# Patient Record
Sex: Female | Born: 1950
Health system: Southern US, Community
[De-identification: ages and names within clinical notes are randomized; demographics above are authoritative.]

## PROBLEM LIST (undated history)

## (undated) DIAGNOSIS — N393 Stress incontinence (female) (male): Secondary | ICD-10-CM

## (undated) DIAGNOSIS — G9050 Complex regional pain syndrome I, unspecified: Secondary | ICD-10-CM

## (undated) DIAGNOSIS — R002 Palpitations: Secondary | ICD-10-CM

## (undated) DIAGNOSIS — T7840XA Allergy, unspecified, initial encounter: Secondary | ICD-10-CM

## (undated) DIAGNOSIS — E039 Hypothyroidism, unspecified: Secondary | ICD-10-CM

## (undated) DIAGNOSIS — I10 Essential (primary) hypertension: Secondary | ICD-10-CM

## (undated) DIAGNOSIS — I255 Ischemic cardiomyopathy: Secondary | ICD-10-CM

## (undated) DIAGNOSIS — E6609 Other obesity due to excess calories: Secondary | ICD-10-CM

## (undated) DIAGNOSIS — M1990 Unspecified osteoarthritis, unspecified site: Secondary | ICD-10-CM

## (undated) DIAGNOSIS — I213 ST elevation (STEMI) myocardial infarction of unspecified site: Secondary | ICD-10-CM

## (undated) DIAGNOSIS — I2510 Atherosclerotic heart disease of native coronary artery without angina pectoris: Secondary | ICD-10-CM

## (undated) DIAGNOSIS — M7730 Calcaneal spur, unspecified foot: Secondary | ICD-10-CM

## (undated) DIAGNOSIS — I499 Cardiac arrhythmia, unspecified: Secondary | ICD-10-CM

## (undated) DIAGNOSIS — D649 Anemia, unspecified: Secondary | ICD-10-CM

## (undated) DIAGNOSIS — K622 Anal prolapse: Secondary | ICD-10-CM

## (undated) DIAGNOSIS — E785 Hyperlipidemia, unspecified: Secondary | ICD-10-CM

## (undated) HISTORY — DX: Unspecified osteoarthritis, unspecified site: M19.90

## (undated) HISTORY — DX: Other obesity due to excess calories: E66.09

## (undated) HISTORY — PX: BUNIONECTOMY: SHX129

## (undated) HISTORY — DX: Calcaneal spur, unspecified foot: M77.30

## (undated) HISTORY — DX: Allergy, unspecified, initial encounter: T78.40XA

## (undated) HISTORY — DX: Anemia, unspecified: D64.9

## (undated) HISTORY — PX: CORONARY ANGIOPLASTY: SHX604

## (undated) HISTORY — PX: DILATION AND CURETTAGE OF UTERUS: SHX78

## (undated) HISTORY — PX: COLONOSCOPY: SHX174

## (undated) HISTORY — DX: Essential (primary) hypertension: I10

## (undated) HISTORY — DX: Anal prolapse: K62.2

## (undated) HISTORY — DX: Complex regional pain syndrome I, unspecified: G90.50

## (undated) HISTORY — DX: Palpitations: R00.2

## (undated) HISTORY — DX: Hypothyroidism, unspecified: E03.9

## (undated) HISTORY — DX: Hyperlipidemia, unspecified: E78.5

## (undated) HISTORY — DX: Stress incontinence (female) (male): N39.3

---

## 1999-02-24 ENCOUNTER — Other Ambulatory Visit: Admission: RE | Admit: 1999-02-24 | Discharge: 1999-02-24 | Payer: Self-pay | Admitting: *Deleted

## 2000-02-25 ENCOUNTER — Other Ambulatory Visit: Admission: RE | Admit: 2000-02-25 | Discharge: 2000-02-25 | Payer: Self-pay | Admitting: *Deleted

## 2001-02-27 ENCOUNTER — Other Ambulatory Visit: Admission: RE | Admit: 2001-02-27 | Discharge: 2001-02-27 | Payer: Self-pay | Admitting: *Deleted

## 2002-04-30 ENCOUNTER — Other Ambulatory Visit: Admission: RE | Admit: 2002-04-30 | Discharge: 2002-04-30 | Payer: Self-pay | Admitting: *Deleted

## 2003-01-21 ENCOUNTER — Encounter: Payer: Self-pay | Admitting: *Deleted

## 2003-01-28 ENCOUNTER — Observation Stay (HOSPITAL_COMMUNITY): Admission: RE | Admit: 2003-01-28 | Discharge: 2003-01-29 | Payer: Self-pay | Admitting: *Deleted

## 2003-01-28 ENCOUNTER — Encounter (INDEPENDENT_AMBULATORY_CARE_PROVIDER_SITE_OTHER): Payer: Self-pay | Admitting: *Deleted

## 2003-07-20 ENCOUNTER — Other Ambulatory Visit: Admission: RE | Admit: 2003-07-20 | Discharge: 2003-07-20 | Payer: Self-pay | Admitting: *Deleted

## 2003-12-19 HISTORY — PX: ABDOMINAL HYSTERECTOMY: SHX81

## 2004-07-20 ENCOUNTER — Other Ambulatory Visit: Admission: RE | Admit: 2004-07-20 | Discharge: 2004-07-20 | Payer: Self-pay | Admitting: *Deleted

## 2004-12-18 DIAGNOSIS — I499 Cardiac arrhythmia, unspecified: Secondary | ICD-10-CM

## 2004-12-18 HISTORY — DX: Cardiac arrhythmia, unspecified: I49.9

## 2005-08-10 ENCOUNTER — Other Ambulatory Visit: Admission: RE | Admit: 2005-08-10 | Discharge: 2005-08-10 | Payer: Self-pay | Admitting: *Deleted

## 2005-10-31 ENCOUNTER — Ambulatory Visit (HOSPITAL_BASED_OUTPATIENT_CLINIC_OR_DEPARTMENT_OTHER): Admission: RE | Admit: 2005-10-31 | Discharge: 2005-10-31 | Payer: Self-pay | Admitting: Orthopedic Surgery

## 2005-10-31 ENCOUNTER — Ambulatory Visit (HOSPITAL_COMMUNITY): Admission: RE | Admit: 2005-10-31 | Discharge: 2005-10-31 | Payer: Self-pay | Admitting: Orthopedic Surgery

## 2006-04-09 ENCOUNTER — Ambulatory Visit: Payer: Self-pay | Admitting: Physical Medicine & Rehabilitation

## 2006-04-09 ENCOUNTER — Encounter
Admission: RE | Admit: 2006-04-09 | Discharge: 2006-07-08 | Payer: Self-pay | Admitting: Physical Medicine & Rehabilitation

## 2006-04-12 ENCOUNTER — Ambulatory Visit: Payer: Self-pay | Admitting: Physical Medicine & Rehabilitation

## 2006-04-23 ENCOUNTER — Encounter
Admission: RE | Admit: 2006-04-23 | Discharge: 2006-07-22 | Payer: Self-pay | Admitting: Physical Medicine & Rehabilitation

## 2006-05-16 ENCOUNTER — Ambulatory Visit: Payer: Self-pay | Admitting: Physical Medicine & Rehabilitation

## 2006-06-18 ENCOUNTER — Ambulatory Visit: Payer: Self-pay | Admitting: Physical Medicine & Rehabilitation

## 2006-07-18 ENCOUNTER — Ambulatory Visit: Payer: Self-pay | Admitting: Physical Medicine & Rehabilitation

## 2006-07-26 ENCOUNTER — Ambulatory Visit: Payer: Self-pay | Admitting: Physical Medicine & Rehabilitation

## 2006-10-15 ENCOUNTER — Other Ambulatory Visit: Admission: RE | Admit: 2006-10-15 | Discharge: 2006-10-15 | Payer: Self-pay | Admitting: *Deleted

## 2007-10-29 ENCOUNTER — Other Ambulatory Visit: Admission: RE | Admit: 2007-10-29 | Discharge: 2007-10-29 | Payer: Self-pay | Admitting: *Deleted

## 2008-03-31 LAB — HM DEXA SCAN: HM Dexa Scan: NORMAL

## 2008-10-29 ENCOUNTER — Other Ambulatory Visit: Admission: RE | Admit: 2008-10-29 | Discharge: 2008-10-29 | Payer: Self-pay | Admitting: Gynecology

## 2010-10-21 ENCOUNTER — Ambulatory Visit: Payer: Self-pay | Admitting: Cardiology

## 2011-05-05 NOTE — Procedures (Signed)
Kim [name], Kim [name]               ACCOUNT NO.:  [id number]   MEDICAL RECORD NO.:  [id number]          PATIENT TYPE:  REC   LOCATION:  TPC                          FACILITY:  [facility]   PHYSICIAN:  Erick Colace, M.D.DATE OF BIRTH:  [birth year]   DATE OF PROCEDURE:  06/04/2006  DATE OF DISCHARGE:                                 OPERATIVE REPORT   PROCEDURE:  Acupuncture treatment #7.   INDICATION:  Reflex sympathetic dystrophy, left lower extremity.   PAIN LEVEL:  3 to 4/10.   TREATMENT TODAY:  Consisted of bilateral BL 21 and BL 27, as well as right  BL 23, BL 25, right QB 30, right QB 31, right QB 34, right BL 60, electrical  stim 4 hertz 30 minutes.  The patient tolerated the procedure well.   Will see her back in about two weeks.      Erick Colace, M.D.  Electronically Signed     AEK/MEDQ  D:  06/04/2006 09:55:39  T:  06/05/2006 06:56:40  Job:  [id number]

## 2011-05-05 NOTE — Discharge Summary (Signed)
   Kim [name], Kim [name]                           ACCOUNT NO.:  [id number]   MEDICAL RECORD NO.:  [id number]                   PATIENT TYPE:  OBV   LOCATION:  0442                                 FACILITY:  [facility]   PHYSICIAN:  Almedia Balls. Fore, M.D.                DATE OF BIRTH:  January 15, 1951   DATE OF ADMISSION:  01/28/2003  DATE OF DISCHARGE:  01/29/2003                                 DISCHARGE SUMMARY   HISTORY:  The patient is a 60 year old with abnormal uterine bleeding,  uterine enlargement for hysterectomy, bilateral salpingo-oophorectomy, on 28 January 2003.  The remainder of her history and physical are as previously  dictated.   LABORATORY DATA:  Preoperative hemoglobin 11.8.  Chest x-ray and  electrocardiogram were both within normal limits.   HOSPITAL COURSE:  The patient was taken to the operating room where  abdominal supracervical hysterectomy, bilateral salpingo-oophorectomy were  performed.  The patient did well postoperatively.  Diet and ambulation were  progressed over the evening of 28 January 2003 and early morning of 29 January 2003.  On the morning of 12 February, she was afebrile, on a  regular diet, and experiencing no problems.  It was felt that she could be  discharged at this time.   FINAL DIAGNOSES:  1. Uterine enlargement.  2. Abnormal uterine bleeding.  3. Pelvic pain.  4. History of endometriosis.   OPERATION:  1. Abdominal supracervical hysterectomy.  2. Bilateral salpingo-oophorectomy.  Pathology report unavailable at the     time of dictation.   DISPOSITION:  Discharged home to return to the office in two weeks for  follow-up.  She was instructed to gradually progress her activities over  several weeks at home and to limit lifting and driving for two weeks.  She  was fully ambulatory, on a regular diet, and in good condition at the time  of discharge.   DISCHARGE MEDICATIONS:  1. Dilaudid 2 mg #30 to be used 1-2 q.4-6h. p.r.n. pain.  2. Doxycycline 100 mg #12 to be taken 1 b.i.d.  3. Reglan generic 10 mg #15 to be used 1 t.i.d.  4. Meclizine 25 mg #30 to be taken 1 t.i.d.   DISCHARGE INSTRUCTIONS:  She will call for any problems.                                               Almedia Balls. [name], M.D.    SRF/MEDQ  D:  01/29/2003  T:  01/29/2003  Job:  [id number]

## 2011-05-05 NOTE — Procedures (Signed)
NAMEGLORIAJEAN, OKUN               ACCOUNT NO.:  [id number]   MEDICAL RECORD NO.:  [id number]          PATIENT TYPE:  REC   LOCATION:                               FACILITY:  [facility]   PHYSICIAN:  Erick Colace, M.D.DATE OF BIRTH:  07/24/1951   DATE OF PROCEDURE:  DATE OF DISCHARGE:                                 OPERATIVE REPORT   Acupuncture treatment for reflex sympathetic dystrophy.  Pain level is down  to 2-3/10, was at a 4 last time.  Treatment today consists of right BL21,  BL23, BL25, BL27, right GB30, right GB31, right GB34, right BL60, 4 Hz stim  30 minutes.  The patient tolerated the procedure well.  We will see her back  in two weeks.      Erick Colace, M.D.  Electronically Signed     AEK/MEDQ  D:  06/21/2006 16:47:36  T:  06/21/2006 19:32:03  Job:  [id number]   cc:   Caralyn Guile. Ethelene Hal, M.D.  Fax: [phone]

## 2011-05-05 NOTE — Procedures (Signed)
Kim [name], Kim [name]               ACCOUNT NO.:  [id number]   MEDICAL RECORD NO.:  [id number]          PATIENT TYPE:  REC   LOCATION:  TPC                          FACILITY:  [facility]   PHYSICIAN:  Erick Colace, M.D.DATE OF BIRTH:  1951-06-04   DATE OF PROCEDURE:  DATE OF DISCHARGE:  07/22/2006                                 OPERATIVE REPORT   The patient returns today.  Her pain is grade 2 to 3/10.  She has started on  enzyme treatment on the recommendation of Melvyn Novas, M.D.  Last pain  level 3 to 4/10, states that physical therapy has also worked on some of her  hip and this has seemed to help some.   Treatment today consisted of needles placed bilateral BL21, BL27, right  BL23, BL25, right GB30, right DL37, right GB34 and right GB44.  Hz 10, 30  minutes.  The patient tolerated the procedure well.  She has finished her  12th treatment  and will follow up on a p.r.n. basis.  Her pain at start of  treatment was 4/10 and down to 2 now.      Erick Colace, M.D.  Electronically Signed     AEK/MEDQ  D:  08/02/2006 13:33:13  T:  08/02/2006 14:17:44  Job:  [id number]   cc:   Caralyn Guile. Ethelene Hal, M.D.  Fax: [phone]

## 2011-05-05 NOTE — Assessment & Plan Note (Signed)
MEDICAL RECORD NUMBER:  [id number].   DATE OF LAST VISIT:  June 21, 2006   INDICATION:  Reflex sympathetic dystrophy right lower extremity.  Pain rated  3-4/10.  Pain last visit 2-3/10.  Break in treatment secondary to vacations.   Treatment today consisted of points placed bilaterally BL21 and BL27, as  well as right BL23, BL25, GB30, GB31, BL40, and BL60; 4 Hz stimulation for  30 minutes.  The patient tolerated the procedure well.  Will see her back in  1 week.      Erick Colace, M.D.  Electronically Signed     AEK/MedQ  D:  07/24/2006 10:07:44  T:  07/24/2006 11:00:07  Job #:  [id number]   cc:   Caralyn Guile. Ethelene Hal, M.D.  Fax: [phone]

## 2011-05-05 NOTE — Procedures (Signed)
[name], [name]               ACCOUNT NO.:  [id number]   MEDICAL RECORD NO.:  [id number]          PATIENT TYPE:  REC   LOCATION:  TPC                          FACILITY:  [facility]   PHYSICIAN:  Erick Colace, M.D.DATE OF BIRTH:  [birth year]   DATE OF PROCEDURE:  DATE OF DISCHARGE:                                 OPERATIVE REPORT   MEDICAL RECORD NUMBER:  [id number]   DATE OF BIRTH:  08-Nov-1951   INDICATION FOR PROCEDURE:  The patient has pain level down to 4/10 on  average.  She states that she still has some paresthesia on the lateral  aspect of her right ankle.  She has had some paresthesias in her hands and  states that she has had carpal tunnel in the past and has had cortisone  injection for it.   TREATMENT:  Treatment today consisted of KI-3, SP-6, DL-60 as well as MH-6  and TH-5, E-Stim between the needles, 4 Hz x30 minutes.  The patient  tolerated the procedure well.   ASSESSMENT AND RECOMMENDATION:  I think that an additional 6 treatments will  be needed.  I have encouraged her to do some lower extremity strengthening  exercises.      Erick Colace, M.D.  Electronically Signed     AEK/MEDQ  D:  05/29/2006 09:25:07  T:  05/29/2006 10:49:46  Job:  [id number]

## 2011-05-05 NOTE — Procedures (Signed)
Kim [name], Kim [name]               ACCOUNT NO.:  [id number]   MEDICAL RECORD NO.:  [id number]          PATIENT TYPE:  REC   LOCATION:  TPC                          FACILITY:  [facility]   PHYSICIAN:  Erick Colace, M.D.DATE OF BIRTH:  1951/02/16   DATE OF PROCEDURE:  DATE OF DISCHARGE:                                 OPERATIVE REPORT   Acupuncture treatment #3.   Date of last visit April 16, 2006.   Treatment consisted of right SP6, SP9, LU7, BL60 and ST36. Electrical  stimulation between SP6 and SP9 as well as between ST36 and BL60 4 Hz 30  minutes.  The patient will return in one week.      Erick Colace, M.D.  Electronically Signed     AEK/MEDQ  D:  04/24/2006 13:42:48  T:  04/25/2006 11:33:40  Job:  [id number]

## 2011-05-05 NOTE — Procedures (Signed)
Kim [name], Kim [name]               ACCOUNT NO.:  [id number]   MEDICAL RECORD NO.:  [id number]          PATIENT TYPE:  REC   LOCATION:  TPC                          FACILITY:  [facility]   PHYSICIAN:  Erick Colace, M.D.DATE OF BIRTH:  [birth year]   DATE OF PROCEDURE:  DATE OF DISCHARGE:                                 OPERATIVE REPORT   PROCEDURE PERFORMED:  Acupuncture treatment #2.   Treatment consisted of right SP6,  SP9, KI3, BL60 and GV34.  Electrical  stimulation between GV34 and BL60 as well as between SP6 and SP9.  The  patient tolerated the procedure well.  Treatment time was 30 minutes.  Return in one week.      Erick Colace, M.D.  Electronically Signed     AEK/MEDQ  D:  04/16/2006 16:45:41  T:  04/17/2006 11:10:14  Job:  [id number]

## 2011-05-05 NOTE — Procedures (Signed)
[name], [name]               ACCOUNT NO.:  [id number]   MEDICAL RECORD NO.:  [id number]          PATIENT TYPE:  REC   LOCATION:  TPC                          FACILITY:  [facility]   PHYSICIAN:  Erick Colace, M.D.DATE OF BIRTH:  [birth year]   DATE OF PROCEDURE:  DATE OF DISCHARGE:                                 OPERATIVE REPORT   History of CRPS, right lower extremity.   Acupuncture treatment #5.   Interval history:  Increasing at level of activity.  Her mother had a hip  fracture, she is caring for her and despite the increased activity, her pain  level has remained steady at 3/10.   She notes less sweating in her lower extremities, less clamminess, no skin  color changes.  In addition she had some hand tingling which improved after  some acupuncture treatment although she still has some residual symptoms of  this.   Treatment today consisted of needles placed at the right SP6, SP9, ST36,  GB39 as well as right TH4, TH5, MH6.  E-stim between [facility] and TH4 as well as  between SP6, SP9 and ST36 and GB39.  In addition point GB34 was placed.   The patient tolerated the procedure well.  Has another visit scheduled next  week.      Erick Colace, M.D.  Electronically Signed     AEK/MEDQ  D:  05/11/2006 11:03:16  T:  05/12/2006 08:03:01  Job:  [id number]   cc:   Caralyn Guile. Ethelene Hal, M.D.  Fax: [phone]   Cassell Clement, M.D.  Fax: [phone]

## 2011-05-05 NOTE — H&P (Signed)
Kim [name], Kim [name]                           ACCOUNT NO.:  [id number]   MEDICAL RECORD NO.:  [id number]                   PATIENT TYPE:  AMB   LOCATION:  DAY                                  FACILITY:  [facility]   PHYSICIAN:  Almedia Balls. Fore, M.D.                DATE OF BIRTH:  June 12, 1951   DATE OF ADMISSION:  01/28/2003  DATE OF DISCHARGE:                                HISTORY & PHYSICAL   CHIEF COMPLAINT:  1. Uterine enlargement.  2. Abnormal uterine bleeding.  3. Anemia secondary to above.   HISTORY:  The patient is a 60 year old, gravida 4, para 3 whose last  menstrual period was January 11, 2003. She has been followed in our office  since December of 1997 and has been noted to have an enlargement of her  uterus over this time. Her menses have become heavier to the point where she  has had hemoglobins in the range of 8 to 10 gm. Recently she has been placed  on progestational agents in an attempt to suppress her menstrual flow. She  underwent hysteroscopy D&C and laparoscopy in December of 2003 with findings  of simple hyperplasia of endometrium and an enlarged uterus with areas of  endometriosis in the posterior cul-de-sac and the uterosacral ligaments. She  is admitted at this time for abdominal hysterectomy, bilateral salpingo-  oophorectomy, possible laparoscopic hysterectomy. She has been fully  counselled as to the nature of the procedure and the risks involved to  include risk of anesthesia, injury to bowel, bladder, blood vessels,  ureters, postoperative hemorrhage, infection, recuperation and use of  hormone replacement following removal of her ovaries. She fully understands  all these considerations and wishes to proceed on January 28, 2003.   PAST MEDICAL HISTORY:  Includes the above noted surgery. She has taken the  progestational agents and used oral contraceptives to improve her bleeding  pattern without success. She has been placed on Lipitor for her increased  cholesterol and atenolol for some irregular heartbeats. She is allergic to  no medications. She is a nonsmoker and nondrinker.   FAMILY HISTORY:  Brother with pancreatic cancer, paternal aunt with breast  cancer. Father and grandfather as well as a paternal uncle with  cardiovascular disease, brother with diabetes mellitus.   REVIEW OF SYMPTOMS:  HEENT:  Wears glasses.  CARDIORESPIRATORY:  Has had  asthmatic attacks in the past related to allergies but these have been  minimal. GASTROINTESTINAL:  Negative. GENITOURINARY:  As in present illness.  NEUROMUSCULAR:  Negative.   PHYSICAL EXAMINATION:  VITAL SIGNS:  Height 5 feet 4 1/4 inches, weight 189  pounds. Blood pressure 128/74, pulse 76, respirations 18.  GENERAL:  Well-developed, white female in no acute distress.  HEENT:  Within normal limits.  NECK:  Supple without masses, adenopathy, or bruits.  HEART:  Regular rate and rhythm without murmurs.  LUNGS:  Clear to auscultation and  percussion.  BREASTS:  Examined sitting and lying without mass. Axilla negative.  ABDOMEN:  Soft without mass, somewhat tender in the lower quadrants.  PELVIC:  External genitalia, Bartholin's, urethra and Skene's glands within  normal limits. Cervix is somewhat inflamed. The uterus is approximately 10-  [redacted] weeks gestational size and somewhat tender. There were no palpable  adnexal masses. Rectovaginal exam is confirmatory.  EXTREMITIES:  Within normal limits.  CENTRAL NERVOUS SYSTEM:  Grossly intact.  SKIN:  Without suspicious lesions.   IMPRESSION:  1. Uterine enlargement.  2. Abnormal uterine bleeding.  3. Anemia secondary to above.   DISPOSITION:  As noted above. Of note is that a Pap smear has been normal  within the past year.                                               Almedia Balls. [name], M.D.    SRF/MEDQ  D:  01/22/2003  T:  01/22/2003  Job:  [id number]

## 2011-05-05 NOTE — Procedures (Signed)
NAMEAMILLION, SCOBEE               ACCOUNT NO.:  [id number]   MEDICAL RECORD NO.:  [id number]          PATIENT TYPE:  REC   LOCATION:  TPC                          FACILITY:  [facility]   PHYSICIAN:  Erick Colace, M.D.DATE OF BIRTH:  1951-02-09   DATE OF PROCEDURE:  04/29/2006  DATE OF DISCHARGE:                                 OPERATIVE REPORT   Acupuncture treatment #6.  She has had overall reduction of pain from a 4/10  to a 3/10 average.  She feels like she has some pain coming down from her  low back into the right lower extremity.   Treatment today consists of bilateral BL21, BL27, right BL23 and BL25 as  well as right GB30, right GB31, right GB34, and right BL60.  Electrical  stimulation 4 Hz x 30 minutes.  The patient tolerated the procedure well.  Should she have additional improvement after this last treatment, ie pain  level down to 1 or 2/10, would recommend one more treatment and then follow-  up with Dr. Ethelene Hal.      Erick Colace, M.D.  Electronically Signed     AEK/MEDQ  D:  05/17/2006 16:30:36  T:  05/18/2006 07:25:36  Job:  [id number]

## 2011-05-05 NOTE — Op Note (Signed)
[name], [name]               ACCOUNT NO.:  [id number]   MEDICAL RECORD NO.:  [id number]          PATIENT TYPE:  AMB   LOCATION:  DSC                          FACILITY:  [facility]   PHYSICIAN:  Leonides Grills, M.D.     DATE OF BIRTH:  March 29, 1951   DATE OF PROCEDURE:  DATE OF DISCHARGE:                                 OPERATIVE REPORT   PREOPERATIVE DIAGNOSES:  1.  Right hallux valgus.  2.  Right second metatarsalgia.  3.  Right second hammer toe.   POSTOPERATIVE DIAGNOSES:  1.  Right hallux valgus.  2.  Right second metatarsalgia.  3.  Right second hammer toe.   OPERATION:  1.  Right Chevron bunionectomy.  2.  Right stress x-rays foot.  3.  Right second Weil metatarsal shortening osteotomy.  4.  Right second toe MTP joint dorsal capsulotomy with collateral release.  5.  Right second toe EDB to EDL tendon, transfer.   ANESTHESIA:  General.   SURGEON:  Leonides Grills, M.D.   ASSISTANT:  None.   COMPLICATIONS:  None.   DISPOSITION:  Stable to recovery.   INDICATIONS:  This is a 60 year old female with longstanding right forefoot  pain that was indicating with her life style.  She wanted to proceed, she  consented to the procedure and all risks which include infection and  neurovascular injury, nonunion, malunion, hardware irritation, hardware  failure, persistent pain, worsening, prolonged recovery, stiffness,  arthritis, cock-up toe deformity second toe, recurrence of deformed and  prolonged recovery were all explained, questions encouraged and answered.   OPERATION:  The patient was brought to the operating room and placed in the  supine position after general endotracheal tube anesthesia was administered  small Ancef 1 gm IV piggyback.  The right lower extremity was then prepped  and draped in a sterile manner, placed thigh tourniquet, and limb was  gravity exsanguinated.  The tourniquet was elevated to 290 mmHg.  A  longitudinal incision in the medial aspect of the  midline of the great toe  MTP joint was then made.  Dissection was carried down through the skin and  hemostasis was obtained.  Neurovascular structures are identified both  superiorly and inferiorly and protected throughout the case.   An L-shaped capsulotomy was then made.  A simple bunionectomy was then  performed with the Saggital saw and lateral capsule was then released with  the curved beaver blade.  The center head was then identified.  The soft  tissues were elevated along the projected osteotomy and the soft tissue was  then protected.  A Chevron osteotomy was then created with the sagittal saw.  The head was then translated approximately 3-to-4-mm laterally and then  fixed with a 2.0 mm fully threaded cortical set screw using a 1.5 mm drill  hole respectively.  This was counter sunk.  We then copiously irrigated the  joint and area with normal saline.  Capsule was then advanced both  superiorly and proximally and repaired with 7-0 Vicryl suture.  This gave an  outstanding repair.  Once this was done, we then obtained stress  x-rays in  AP and lateral planes that showed no gross motion of the osteotomy site.  Fixation, improvisation, and correction was then in excellent position as  well.  We then made a longitudinal incision over the right second toe, MTP  joint area.  Dissection was carried out through the skin and hemostasis was  obtained.  The extensor digitorum longus and brevis were identified with the  longus tenotomized proximal and medial and the brevis distal and lateral and  retracted out of harms way for later transfer.  We then made a dorsal  capsulotomy and collateral release protecting soft tissues both medially and  laterally.   Once this was performed we then plantar flexed the toe, elevated the soft  tissue over the dorsal aspect of the metatarsal head for preparation of the  screw and we then, with stacked saw blades, created an osteotomy that was  parallel  with the plantar aspect of the foot.  We then translated the head  approximately 3-4 mm proximally and fixed this with a 1.5 mm fully threaded  cortical lag screw using 1.5-mm and 1.1-mm drill holes respectively.  This  had excellent purchase and compression across the osteotomy site.  The  redundant bone, distally was then trimmed off of the __________ with a  rongeur.  The area and joint were copiously irrigated with normal saline.   At this point we then did perform the transfer of the EDB to the EDL with 3-  0 PDS suture.  This had an outstanding repair and the toe was held in a  neutral position.  The area was copiously irrigated with normal saline.  The  tourniquet was deflated, hemostasis was obtained.  The skin was closed with  4-0 nylon suture of all wounds.  A sterile dressing was applied by Kim [name], dressing was applied, hard sole shoes. Patient stable to the PR.      Leonides Grills, M.D.  Electronically Signed     PB/MEDQ  D:  10/31/2005  T:  10/31/2005  Job:  [id number]

## 2011-05-05 NOTE — Group Therapy Note (Signed)
HISTORY:  A89 year old female who underwent bunionectomy in November of  2006.  Had postoperative right Chevron bunionectomy, second _____ metatarsal  osteotomy, second ___ hammer toe reconstruction.  Postoperatively had  increasing pain approximately one to two months postoperative.  She has had  erythema, some burning discomfort.  She was trialed on Neurontin.  Was sent  to Dr. Danne Harbor.  She was tried on Medrol dose pack.  She could not  tolerate cognitive side effects of Neurontin, i.e., fuzzy headedness, and  started on Lyrica which caused similar side effects, but of lesser  intensity.  Has, however, helped with her right foot and toe pain.  She has  been to physical therapy for desensitization and this has been helpful.  She  has been started on a tens unit.  Follow-up x-rays showed good healing.  She  has had lumbar sympathetic block x2 with partial relief.  Her pain is 4/10.  Sleep is fair.  Pain is worse in the evening and at night, increased with  walking and standing.  Described as burning and tingling.  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  She needs assistance with  household duties and shopping secondary to being up on her feet a lot.   REVIEW OF SYSTEMS:  As above.  Also complains of back spasms.   PAST MEDICAL HISTORY:  1.  Hysterectomy.  2.  Hypercholesterolemia.  3.  Tachycardia, although just maintained on a low dose Lopressor.   SOCIAL HISTORY:  Married.  She is accompanied by her mother-in-law.   FAMILY HISTORY:  Heart disease, high blood pressure.   PHYSICAL EXAMINATION:  VITAL SIGNS:  Blood pressure 119/77, pulse 77,  respirations 17, O2 saturation 99% on room air.  GENERAL:  No acute distress.  Mood and affect appropriate.  BACK:  Has no tenderness to palpation.  She has some pain with forward  flexion compared with extension.  EXTREMITIES:  She has full strength bilateral lower extremities.  She has  mild erythema and some  sweating in the great toe as well as first MTP  greater than second MTP.  She has healing scars that are not hypersensitive.  She does have some paraesthesias when the top of the foot is stroked limited  mainly to the peroneal distribution.  She has negative Tinel's over the  peroneal area.  She has normal deep tendon reflexes and normal strength, as  noted.  No evidence of nail bed changes.  No evidence of intrinsic atrophy.   IMPRESSION:  CRPS type 2, mainly peroneal distribution.   PLAN:  Discussed treatment options including acupuncture which I think is  reasonable in this case.  Would avoid the foot and ankle area, at least  initially, concentrating more on the leg as well as some paraspinals.  Have  recommended trial treatments x6 on a weekly basis 30-minute treatments with  electrical stimulation.  Patient would like to schedule this.  Will do a  treatment today as part of consultation but remaining treatments would be  outside of her insurance given she has no benefits for the acupuncture.   Treatment today consisted of bilateral SP6, LI 4 as well as bilateral ST36  electrical stimulation _____ 2 Hz x20 minutes.  Patient tolerated procedure  well.      Erick Colace, M.D.  Electronically Signed     AEK/MedQ  D:  04/10/2006 12:06:13  T:  04/11/2006 08:34:54  Job #:  [id number]   cc:  Richard D. Ethelene Hal, M.D.  Fax: [phone]   Cassell Clement, M.D.  Fax: [phone]

## 2011-05-05 NOTE — Op Note (Signed)
Kim [name], Kim [name]                           ACCOUNT NO.:  [id number]   MEDICAL RECORD NO.:  [id number]                   [name] TYPE:  AMB   LOCATION:  DAY                                  FACILITY:  [facility]   PHYSICIAN:  Almedia Balls. Fore, M.D.                DATE OF BIRTH:  1951-09-12   DATE OF PROCEDURE:  01/28/2003  DATE OF DISCHARGE:                                 OPERATIVE REPORT   PREOPERATIVE DIAGNOSES:  Uterine enlargement, abnormal uterine bleeding,  endometriosis by history.   POSTOPERATIVE DIAGNOSES:  Uterine enlargement, abnormal uterine bleeding,  endometriosis by history.  Pending pathology.   OPERATION PERFORMED:  Abdominal supracervical hysterectomy, bilateral  salpingo-oophorectomy.   SURGEON:  Almedia Balls. Kim [name], M.D.   ASSISTANT:  Leona Singleton, M.D.   ANESTHESIA:  General orotracheal.   INDICATIONS FOR PROCEDURE:  The [name] is a 60 year old with the above  noted problems who was counseled as to the need for surgery to treat these  problems.  She was fully counseled as to the nature of the procedure and the  risks involved to include risks of anesthesia, injury to bowel, bladder,  blood vessels, ureters, postoperative hemorrhage, infection, recuperation.  She fully understands all these considerations and wishes to proceed on  January 28, 2003.   OPERATIVE FINDINGS:  On attempt to effect descent of the uterus, it was  found that the uterus was too large to descend well into the pelvis.  It was  therefore determined that an abdominal procedure was necessary.  On entry  into the abdomen it was noted that the uterus was approximately 14 to [redacted]  weeks gestational size.  Both ovaries appeared normal as did the tubes.  There were fairly dense adhesions involving descending rectosigmoid to the  area of the left adnexal structures.  The lower liver edge area of the  gallbladder, spleen, area of the appendix were normal to palpation and/or  visualization.   DESCRIPTION OF PROCEDURE:  With the [name] under general anesthesia,  prepared and draped in the usual sterile fashion, a speculum was placed in  the vagina.  The anterior lip of the cervix was grasped with a single  toothed tenaculum, and an attempt was made to effect descent of the uterus  which was unsuccessful.  Accordingly, it was felt that proceeding with an  abdominal incision was indicated.  The Foley catheter was placed and the  [name] was reprepared and draped for an abdominal procedure.  A lower  abdominal transverse incision was made and carried into the peritoneal  cavity without difficulty.  A self-retaining retractor was placed and the  bowel was packed off.  Kelly clamps were used to clamp the utero-ovarian  anastomoses, tubes and round ligaments bilaterally for traction and  hemostasis.  The round ligaments were transected using Bovie  electrocoagulator bilaterally with creation of bladder flap anteriorly and  entry into the retroperitoneal  space.  Infundibulopelvic ligaments  bilaterally were identified, clamped, cut, and doubly ligated with 1 chromic  catgut.  Uterine vessels were skeletonized bilaterally, clamped, cut, and  individually suture ligated with 1 chromic catgut.  Cardinal ligaments were  likewise clamped bilaterally, cut, and suture ligated with 1 chromic catgut.  It was then possible to excise the fundus from the cervix which was  accomplished without difficulty using Bovie electrocoagulator.  Small  bleeders were rendered hemostatic with Bovie electrocoagulation.  The  cervical stump was rendered hemostatic and reapproximated with interrupted  figure-of-eight sutures of #1 chromic catgut.  The area was then lavaged  with copious amounts of lactated Ringer's solution, and small bleeders were  rendered hemostatic with Bovie electrocoagulation.  After noting that  hemostasis was maintained and that sponge and instrument counts were  correct, the peritoneum  was closed with a continuous suture of 0 Vicryl.  Fascia was closed with two sutures of 0 Vicryl which were brought from the  lateral aspects of the incision and tied separately in the midline.  Subcutaneous fat was reapproximated using interrupted sutures of 0 Vicryl.  The skin was closed with subcuticular suture of 3-0 plain catgut.   ESTIMATED BLOOD LOSS:  250 ml.   DISPOSITION:  The [name] was taken to the recovery room in good condition  with clear urine in the  Foley catheter tubing.  She will be placed on 23  hour observation following surgery.                                               Almedia Balls. Kim [name], M.D.    SRF/MEDQ  D:  01/28/2003  T:  01/28/2003  Job:  [id number]   cc:   Leona Singleton, M.D.  [address]  [place]  [place] [address]  Fax: [phone]

## 2011-05-05 NOTE — Procedures (Signed)
[name], Kim [name]                ACCOUNT NO.:  [id number]   MEDICAL RECORD NO.:  [id number]          PATIENT TYPE:  REC   LOCATION:  [facility]                          FACILITY:  [facility]   PHYSICIAN:  Erick Colace, M.D.DATE OF BIRTH:  1951-08-07   DATE OF PROCEDURE:  DATE OF DISCHARGE:                                 OPERATIVE REPORT   PROCEDURE:  Acupuncture treatment.   INDICATIONS:  Reflex sympathetic dystrophy, right lower extremity, pain  level 3-4/10.   TREATMENT:  Treatment consisted of needles placed bilateral BL21, bilateral  BL27, right BL23 and BL25, right GB30, right VL37, right GB 34 and right  GB40.  4 Hz stimulation 30 minutes.  The patient tolerated the procedure  well.  I will see her back in one week.      Erick Colace, M.D.  Electronically Signed     AEK/MEDQ  D:  07/30/2006 13:59:24  T:  07/31/2006 07:15:50  Job:  [id number]   cc:   Caralyn Guile. Ethelene Hal, M.D.  Fax: [phone]

## 2011-05-05 NOTE — Assessment & Plan Note (Signed)
This is acupuncture treatment #4.   INDICATIONS FOR PROCEDURE:  CRPS type 2 right lower extremity, peroneal  distribution.   INTERVAL HISTORY:  Patient complains of numbness and tingling not only in  right lower extremity but also right upper extremity, no nocturnal  predisposition.  No change in activities, she attributed to acupuncture,  however, had not placed upper extremity points up until today.   Pain level 3 to 4/10.  This compares with initial 4/10.   Blood pressure 109/64, pulse 68.   Patient in supine position.  Needles placed at right SP6, SP9, ST36 and GB39  as well as right TH4, TH8 and MH6.  Electrical stimulation between [facility] and  TH4 as well as between SP6 and SP9 as well as between ST36 and GB39.  Patient tolerated the procedure well.  Treatment was 30 minutes.   In regards to her upper extremity symptoms, would recommend notifying her  cardiologist about above, rule out TIA.      Erick Colace, M.D.  Electronically Signed     AEK/MedQ  D:  05/01/2006 09:58:11  T:  05/01/2006 14:36:49  Job #:  [id number]   cc:   Caralyn Guile. Ethelene Hal, M.D.  Fax: [phone]   Cassell Clement, M.D.  Fax: [phone]

## 2011-06-12 ENCOUNTER — Encounter: Payer: Self-pay | Admitting: Cardiology

## 2011-06-19 ENCOUNTER — Other Ambulatory Visit: Payer: Self-pay | Admitting: *Deleted

## 2011-06-19 ENCOUNTER — Ambulatory Visit: Payer: Self-pay | Admitting: Cardiology

## 2011-08-24 ENCOUNTER — Ambulatory Visit (INDEPENDENT_AMBULATORY_CARE_PROVIDER_SITE_OTHER): Payer: BC Managed Care – PPO | Admitting: *Deleted

## 2011-08-24 DIAGNOSIS — E785 Hyperlipidemia, unspecified: Secondary | ICD-10-CM

## 2011-08-24 LAB — HEPATIC FUNCTION PANEL
Bilirubin, Direct: 0 mg/dL (ref 0.0–0.3)
Total Bilirubin: 0.7 mg/dL (ref 0.3–1.2)
Total Protein: 7.3 g/dL (ref 6.0–8.3)

## 2011-08-24 LAB — LIPID PANEL
HDL: 42.5 mg/dL (ref >39.00)
Total CHOL/HDL Ratio: 3
Triglycerides: 219 mg/dL — ABNORMAL HIGH (ref 0.0–149.0)
VLDL: 43.8 mg/dL — ABNORMAL HIGH (ref 0.0–40.0)

## 2011-08-24 LAB — BASIC METABOLIC PANEL
Calcium: 8.9 mg/dL (ref 8.4–10.5)
Creatinine, Ser: 0.9 mg/dL (ref 0.4–1.2)
GFR: 69.65 mL/min (ref >60.00)
Sodium: 141 mEq/L (ref 135–145)

## 2011-08-24 LAB — LDL CHOLESTEROL, DIRECT: Direct LDL: 86.2 mg/dL

## 2011-09-04 ENCOUNTER — Other Ambulatory Visit: Payer: Self-pay | Admitting: *Deleted

## 2011-09-07 ENCOUNTER — Encounter: Payer: Self-pay | Admitting: Cardiology

## 2011-09-07 ENCOUNTER — Ambulatory Visit (INDEPENDENT_AMBULATORY_CARE_PROVIDER_SITE_OTHER): Payer: BC Managed Care – PPO | Admitting: Cardiology

## 2011-09-07 VITALS — BP 118/70 | HR 80 | Wt 190.0 lb

## 2011-09-07 DIAGNOSIS — E7800 Pure hypercholesterolemia, unspecified: Secondary | ICD-10-CM

## 2011-09-07 DIAGNOSIS — I119 Hypertensive heart disease without heart failure: Secondary | ICD-10-CM

## 2011-09-07 DIAGNOSIS — E785 Hyperlipidemia, unspecified: Secondary | ICD-10-CM | POA: Insufficient documentation

## 2011-09-07 DIAGNOSIS — R002 Palpitations: Secondary | ICD-10-CM

## 2011-09-07 DIAGNOSIS — Z8679 Personal history of other diseases of the circulatory system: Secondary | ICD-10-CM | POA: Insufficient documentation

## 2011-09-07 MED ORDER — LOVASTATIN 10 MG PO TABS: 10.0000 mg | ORAL_TABLET | Freq: Every day | ORAL | Status: DC

## 2011-09-07 NOTE — Assessment & Plan Note (Signed)
The patient has not been having any symptoms referable to her blood pressure.  She denies any headaches or dizzy spells.  She does continue to express malaise and fatigue.

## 2011-09-07 NOTE — Progress Notes (Signed)
Kim Brown Date of Birth:  05-01-1951 [facility] [address]   [address] [place], [place]  [address] [phone]           Fax   [phone]  HPI: This pleasant 60 year old woman is seen for a six-month followup office visit.  She has a history of essential hypertension and history of hypercholesterolemia.  She also has exogenous obesity.  She has a past history of reflex sympathetic dystrophy of her right arm.  She was recently found to be hypothyroid by her medical doctor and is now on low dose Synthroid.  She has had a chronic problem with obesity and has been unable to lose significant weight.  She's had a chronic problem with her foot which is made it difficult for her to walk.  She's had malaise and fatigue.  Her allergist recently found that she was allergic to garlic Baker's yeast and all limbs.  The patient has been under a lot of stress.  Her mother-in-law has a severe case of the shingles.  Current Outpatient Prescriptions  Medication Sig Dispense Refill  . atenolol (TENORMIN) 25 MG tablet Take 12.5 mg by mouth daily.        Marland Kitchen b complex vitamins tablet Take 1 tablet by mouth daily.        . Calcium Carbonate-Vitamin D (CALTRATE 600+D PO) Take by mouth daily.        . Cholecalciferol (VITAMIN D PO) Take by mouth daily. Taking 2000 daily      . co-enzyme Q-10 30 MG capsule Take 30 mg by mouth as needed.        Marland Kitchen DHEA 25 MG CAPS Take by mouth daily.        Marland Kitchen estradiol (VIVELLE-DOT) 0.05 MG/24HR Place 1 patch onto the skin once a week. Twice weekly       . ezetimibe-simvastatin (VYTORIN) 10-20 MG per tablet Take 1 tablet by mouth at bedtime.        . Magnesium 250 MG TABS Take by mouth daily.        . Multiple Vitamin (MULTIVITAMIN) tablet Take 1 tablet by mouth daily.        . NON FORMULARY pregelone 50mg        . progesterone (PROMETRIUM) 200 MG capsule Take 200 mg by mouth daily.        Marland Kitchen thyroid (ARMOUR) 30 MG tablet Take 30 mg by mouth 2 (two)  times daily.        . vitamin A 8000 UNIT capsule Take 8,000 Units by mouth daily.        Marland Kitchen lovastatin (MEVACOR) 10 MG tablet Take 1 tablet (10 mg total) by mouth at bedtime.  90 tablet  3    No Known Allergies  There is no problem list on file for this patient.   History  Smoking status  . Never Smoker   Smokeless tobacco  . Not on file    History  Alcohol Use No    Family History  Problem Relation Age of Onset  . Arthritis Mother   . Hypertension Mother   . Heart disease Father   . Hypertension Father   . Arthritis Father   . Stroke Father   . Pancreatic cancer Brother   . Heart failure Brother   . Diabetes Brother     Review of Systems: The patient denies any heat or cold intolerance.  No weight gain or weight loss.  The patient denies headaches or blurry  vision.  There is no cough or sputum production.  The patient denies dizziness.  There is no hematuria or hematochezia.  The patient denies any muscle aches or arthritis.  The patient denies any rash.  The patient denies frequent falling or instability.  There is no history of depression or anxiety.  All other systems were reviewed and are negative.   Physical Exam: Filed Vitals:   09/07/11 1508  BP: 118/70  Pulse: 80  The general appearance reveals a slightly obese woman in no acute distress.The head and neck exam reveals pupils equal and reactive.  Extraocular movements are full.  There is no scleral icterus.  The mouth and pharynx are normal.  The neck is supple.  The carotids reveal no bruits.  The jugular venous pressure is normal.  The  thyroid is not enlarged.  There is no lymphadenopathy.  The chest is clear to percussion and auscultation.  There are no rales or rhonchi.  Expansion of the chest is symmetrical.  The precordium is quiet.  The first heart sound is normal.  The second heart sound is physiologically split.  There is no murmur gallop rub or click.  There is no abnormal lift or heave.  The abdomen is  soft and nontender.  The bowel sounds are normal.  The liver and spleen are not enlarged.  There are no abdominal masses.  There are no abdominal bruits.  Extremities reveal good pedal pulses.  There is no phlebitis or edema.  There is no cyanosis or clubbing.  Strength is normal and symmetrical in all extremities.  There is no lateralizing weakness.  There are no sensory deficits.  The skin is warm and dry.  There is no rash.      Assessment / Plan:  We gave her a prescription to get a shingles shot at her request.  Her mother-in-law has a bad case of the shingles.  Continue same medication except switch to lovastatin 10 mg daily.  Recheck 6 months

## 2011-09-07 NOTE — Assessment & Plan Note (Signed)
The patient remains on Vytorin 10/20 at the present time because she was able to get free samples.  Her source of samples is no longer present and we will switch her at her request to something less expensive.  We'll switch her to lovastatin 10 mg daily.  She has not been able to tolerate high-dose statins because of myalgias.

## 2011-12-07 ENCOUNTER — Encounter: Payer: Self-pay | Admitting: Family Medicine

## 2011-12-07 ENCOUNTER — Ambulatory Visit (INDEPENDENT_AMBULATORY_CARE_PROVIDER_SITE_OTHER): Payer: BC Managed Care – PPO | Admitting: Family Medicine

## 2011-12-07 DIAGNOSIS — R319 Hematuria, unspecified: Secondary | ICD-10-CM

## 2011-12-07 DIAGNOSIS — I119 Hypertensive heart disease without heart failure: Secondary | ICD-10-CM

## 2011-12-07 DIAGNOSIS — E7800 Pure hypercholesterolemia, unspecified: Secondary | ICD-10-CM

## 2011-12-07 LAB — POCT URINALYSIS DIPSTICK
Ketones, UA: NEGATIVE
Leukocytes, UA: NEGATIVE
Protein, UA: NEGATIVE
Urobilinogen, UA: 0.2
pH, UA: 7

## 2011-12-07 NOTE — Patient Instructions (Signed)
Check with your insurance to see if they will cover the shingles shot. Get your labs done- fasting- in 3/13.   Schedule a physical for 9/13.  Take care.  I would total your calories per day and then cut back by 100 calories.

## 2011-12-07 NOTE — Progress Notes (Signed)
Needs to est care. New patient.    Hypertension:    Using medication without problems or lightheadedness: yes Chest pain with exertion:no Edema:no Short of breath:no  Elevated Cholesterol: Using medications without problems:yes Muscle aches: no Diet compliance:yes Exercise:discussed.    H/o hematuria.  Pre vwith neg cystoscopy per patient.  Requesting records. No recent blood in urine.   No dysuria, but some stress incont which isn't new.   Sees Dr. Chevis Pretty with gyn, had DXA/mammogram done via gyn clinic and [facility].  Flu shot already done per patient, [facility] [place] and pt was told 5 year f/u.    PMH and SH reviewed  ROS: See HPI, otherwise noncontributory.  Meds, vitals, and allergies reviewed.   GEN: nad, alert and oriented HEENT: mucous membranes moist NECK: supple w/o LA CV: rrr.  no murmur PULM: ctab, no inc wob ABD: soft, +bs EXT: no edema SKIN: no acute rash

## 2011-12-08 LAB — URINALYSIS, MICROSCOPIC ONLY
Bacteria, UA: NONE SEEN
Crystals: NONE SEEN

## 2011-12-10 ENCOUNTER — Encounter: Payer: Self-pay | Admitting: Family Medicine

## 2011-12-10 DIAGNOSIS — R319 Hematuria, unspecified: Secondary | ICD-10-CM | POA: Insufficient documentation

## 2011-12-10 NOTE — Assessment & Plan Note (Signed)
See notes on labs. 

## 2011-12-10 NOTE — Assessment & Plan Note (Addendum)
BP controlled, no change in meds.  Continue heart healthy diet.  Return for labs.

## 2011-12-10 NOTE — Assessment & Plan Note (Signed)
Continue meds and return for labs.  Diet and exercise.

## 2012-01-22 ENCOUNTER — Encounter: Payer: Self-pay | Admitting: Family Medicine

## 2012-05-10 ENCOUNTER — Other Ambulatory Visit (HOSPITAL_COMMUNITY): Payer: Self-pay | Admitting: *Deleted

## 2012-05-10 MED ORDER — ATENOLOL 25 MG PO TABS: 12.5000 mg | ORAL_TABLET | Freq: Every day | ORAL | Status: DC

## 2012-05-10 NOTE — Telephone Encounter (Signed)
Refilled atenolol

## 2012-07-15 ENCOUNTER — Encounter: Payer: Self-pay | Admitting: *Deleted

## 2012-07-15 ENCOUNTER — Encounter: Payer: Self-pay | Admitting: Family Medicine

## 2012-11-11 ENCOUNTER — Telehealth: Payer: Self-pay | Admitting: Family Medicine

## 2012-11-11 DIAGNOSIS — Z780 Asymptomatic menopausal state: Secondary | ICD-10-CM

## 2012-11-11 NOTE — Telephone Encounter (Signed)
Ordered

## 2012-11-11 NOTE — Telephone Encounter (Signed)
Patient has been rescheduled to see you on 12/02. She would really like to be set up for a Bone Density before the end of the year. She is a Postmenopausal female and she had one back in 2009. Please if you could get this set up before the end of the year it would be great. Call the patient [phone].

## 2012-11-12 ENCOUNTER — Encounter: Payer: BC Managed Care – PPO | Admitting: Family Medicine

## 2012-11-12 NOTE — Telephone Encounter (Signed)
Shirlee Limerick has already scheduled the DXA.

## 2012-11-18 ENCOUNTER — Encounter: Payer: Self-pay | Admitting: Family Medicine

## 2012-11-18 ENCOUNTER — Ambulatory Visit (INDEPENDENT_AMBULATORY_CARE_PROVIDER_SITE_OTHER): Payer: BC Managed Care – PPO | Admitting: Family Medicine

## 2012-11-18 VITALS — BP 118/72 | HR 82 | Temp 97.9°F | Ht 64.0 in | Wt 174.8 lb

## 2012-11-18 DIAGNOSIS — R109 Unspecified abdominal pain: Secondary | ICD-10-CM

## 2012-11-18 DIAGNOSIS — Z8679 Personal history of other diseases of the circulatory system: Secondary | ICD-10-CM

## 2012-11-18 DIAGNOSIS — Z23 Encounter for immunization: Secondary | ICD-10-CM

## 2012-11-18 DIAGNOSIS — Z0000 Encounter for general adult medical examination without abnormal findings: Secondary | ICD-10-CM

## 2012-11-18 DIAGNOSIS — E7800 Pure hypercholesterolemia, unspecified: Secondary | ICD-10-CM

## 2012-11-18 NOTE — Progress Notes (Signed)
CPE- See plan.  Routine anticipatory guidance given to patient.  See health maintenance. Tetanus 2007 Flu shot today.  Shingles shot encouraged. PNA shot at 65.  DXA scheduled.   Mammogram done 7/13.  Colonoscopy ~2009 per [facility] in [place] and pt was told 5 year f/u.  Pap not indicated.  Low risk, single partner, no h/o abnormal Paps.   Exercise- "Not as much as I should."  Discussed.   Has a living will.  Husband is designated if incapacitated.    Hypertension:    Using medication without problems: yes, but with occ lightheadedness.   Chest pain with exertion:no Edema:no Short of breath:no She wanted to come off the betablocker.  Discussed.   Has lost weight intentionally.    She has had some occ LUQ discomfort, usually when watching the news or when she gets worried.  Going on intermittently for about 1 year. No vomiting/blood in vomit, black stools.    HRT and lipids are per MD in [facility].  See hard copy labs.  I will defer mgmt of all of those labs to outside/ordering clinic.   PMH and SH reviewed  Meds, vitals, and allergies reviewed.   ROS: See HPI.  Otherwise negative.    GEN: nad, alert and oriented HEENT: mucous membranes moist NECK: supple w/o LA CV: rrr. PULM: ctab, no inc wob ABD: soft, +bs, no masses, no rebound EXT: no edema SKIN: no acute rash Breast exam: breast are diffusely dense but no mass, nodules, thickening, tenderness, bulging, retraction, inflamation, nipple discharge or skin changes noted.  No axillary or clavicular LA.  Chaperoned exam.

## 2012-11-18 NOTE — Patient Instructions (Addendum)
Check with your insurance to see if they will cover the shingles shot. Stop the atenolol and if your pressure is <140/<90 and you don't have palpitations, then stay off it.   Take care.  Keep exercising.

## 2012-11-19 DIAGNOSIS — R109 Unspecified abdominal pain: Secondary | ICD-10-CM | POA: Insufficient documentation

## 2012-11-19 DIAGNOSIS — Z0000 Encounter for general adult medical examination without abnormal findings: Secondary | ICD-10-CM | POA: Insufficient documentation

## 2012-11-19 NOTE — Assessment & Plan Note (Signed)
Per outside clinic. 

## 2012-11-19 NOTE — Assessment & Plan Note (Signed)
Only in LUQ and this is likely related to anxiety/heartburn.  We talked about stress reduction and symptomatic tx.  Going on for a year.  She'll notify clinic if worsening.  She agrees with plan.

## 2012-11-19 NOTE — Assessment & Plan Note (Signed)
Routine anticipatory guidance given to patient.  See health maintenance. Tetanus 2007 Flu shot today.  Shingles shot encouraged. PNA shot at 65.  DXA scheduled.   Mammogram done 7/13.  Colonoscopy ~2009 per [facility] in [place] and pt was told 5 year f/u.  Pap not indicated.  Low risk, single partner, no h/o abnormal Paps.   Exercise- "Not as much as I should."  Discussed.   Has a living will.  Husband is designated if incapacitated.

## 2012-11-19 NOTE — Assessment & Plan Note (Signed)
Improved with weight loss.  Will stop BB and she'll monitor for palpitations and elevated BP.  If neither occurs, will stay off BB.

## 2012-11-29 ENCOUNTER — Ambulatory Visit (INDEPENDENT_AMBULATORY_CARE_PROVIDER_SITE_OTHER)
Admission: RE | Admit: 2012-11-29 | Discharge: 2012-11-29 | Disposition: A | Discharge status: Home / Self Care | Payer: BC Managed Care – PPO | Source: Ambulatory Visit

## 2012-11-29 DIAGNOSIS — Z780 Asymptomatic menopausal state: Secondary | ICD-10-CM

## 2012-12-31 ENCOUNTER — Telehealth: Payer: Self-pay

## 2012-12-31 NOTE — Telephone Encounter (Signed)
Husband answered and was notified.

## 2012-12-31 NOTE — Telephone Encounter (Signed)
It was normal.  I thought she already had gotten results.  Please notify her.  Thanks.

## 2012-12-31 NOTE — Telephone Encounter (Signed)
Pt left v/m requesting results of bone density done 11/29/14.Please advise.

## 2013-01-17 ENCOUNTER — Other Ambulatory Visit: Payer: Self-pay | Admitting: *Deleted

## 2013-01-17 NOTE — Telephone Encounter (Signed)
Pt called for dexa results, I informed her of results and that she due for another screening in 2 years, per report.

## 2013-06-11 ENCOUNTER — Emergency Department (HOSPITAL_BASED_OUTPATIENT_CLINIC_OR_DEPARTMENT_OTHER)
Admission: EM | Admit: 2013-06-11 | Discharge: 2013-06-11 | Disposition: A | Discharge status: Home / Self Care | Payer: BC Managed Care – PPO | Attending: Emergency Medicine | Admitting: Emergency Medicine

## 2013-06-11 ENCOUNTER — Emergency Department (HOSPITAL_BASED_OUTPATIENT_CLINIC_OR_DEPARTMENT_OTHER): Payer: BC Managed Care – PPO

## 2013-06-11 ENCOUNTER — Encounter (HOSPITAL_BASED_OUTPATIENT_CLINIC_OR_DEPARTMENT_OTHER): Payer: Self-pay | Admitting: Family Medicine

## 2013-06-11 ENCOUNTER — Telehealth: Payer: Self-pay | Admitting: Family Medicine

## 2013-06-11 DIAGNOSIS — R6883 Chills (without fever): Secondary | ICD-10-CM | POA: Insufficient documentation

## 2013-06-11 DIAGNOSIS — M549 Dorsalgia, unspecified: Secondary | ICD-10-CM | POA: Insufficient documentation

## 2013-06-11 DIAGNOSIS — I10 Essential (primary) hypertension: Secondary | ICD-10-CM | POA: Insufficient documentation

## 2013-06-11 DIAGNOSIS — R5383 Other fatigue: Secondary | ICD-10-CM | POA: Insufficient documentation

## 2013-06-11 DIAGNOSIS — R0989 Other specified symptoms and signs involving the circulatory and respiratory systems: Secondary | ICD-10-CM | POA: Insufficient documentation

## 2013-06-11 DIAGNOSIS — E785 Hyperlipidemia, unspecified: Secondary | ICD-10-CM | POA: Insufficient documentation

## 2013-06-11 DIAGNOSIS — Z8669 Personal history of other diseases of the nervous system and sense organs: Secondary | ICD-10-CM | POA: Insufficient documentation

## 2013-06-11 DIAGNOSIS — R5381 Other malaise: Secondary | ICD-10-CM | POA: Insufficient documentation

## 2013-06-11 DIAGNOSIS — R059 Cough, unspecified: Secondary | ICD-10-CM | POA: Insufficient documentation

## 2013-06-11 DIAGNOSIS — J3489 Other specified disorders of nose and nasal sinuses: Secondary | ICD-10-CM | POA: Insufficient documentation

## 2013-06-11 DIAGNOSIS — F411 Generalized anxiety disorder: Secondary | ICD-10-CM | POA: Insufficient documentation

## 2013-06-11 DIAGNOSIS — J309 Allergic rhinitis, unspecified: Secondary | ICD-10-CM | POA: Insufficient documentation

## 2013-06-11 DIAGNOSIS — R0609 Other forms of dyspnea: Secondary | ICD-10-CM | POA: Insufficient documentation

## 2013-06-11 DIAGNOSIS — Z79899 Other long term (current) drug therapy: Secondary | ICD-10-CM | POA: Insufficient documentation

## 2013-06-11 DIAGNOSIS — R0789 Other chest pain: Secondary | ICD-10-CM | POA: Insufficient documentation

## 2013-06-11 DIAGNOSIS — Z8742 Personal history of other diseases of the female genital tract: Secondary | ICD-10-CM | POA: Insufficient documentation

## 2013-06-11 DIAGNOSIS — IMO0001 Reserved for inherently not codable concepts without codable children: Secondary | ICD-10-CM | POA: Insufficient documentation

## 2013-06-11 DIAGNOSIS — F419 Anxiety disorder, unspecified: Secondary | ICD-10-CM

## 2013-06-11 DIAGNOSIS — Z8679 Personal history of other diseases of the circulatory system: Secondary | ICD-10-CM | POA: Insufficient documentation

## 2013-06-11 DIAGNOSIS — J302 Other seasonal allergic rhinitis: Secondary | ICD-10-CM

## 2013-06-11 DIAGNOSIS — E669 Obesity, unspecified: Secondary | ICD-10-CM | POA: Insufficient documentation

## 2013-06-11 DIAGNOSIS — R0600 Dyspnea, unspecified: Secondary | ICD-10-CM

## 2013-06-11 DIAGNOSIS — Z8739 Personal history of other diseases of the musculoskeletal system and connective tissue: Secondary | ICD-10-CM | POA: Insufficient documentation

## 2013-06-11 DIAGNOSIS — E039 Hypothyroidism, unspecified: Secondary | ICD-10-CM | POA: Insufficient documentation

## 2013-06-11 DIAGNOSIS — R05 Cough: Secondary | ICD-10-CM | POA: Insufficient documentation

## 2013-06-11 HISTORY — DX: Cardiac arrhythmia, unspecified: I49.9

## 2013-06-11 LAB — URINALYSIS, ROUTINE W REFLEX MICROSCOPIC
Glucose, UA: NEGATIVE mg/dL
Ketones, ur: NEGATIVE mg/dL
Leukocytes, UA: NEGATIVE
Nitrite: NEGATIVE
Protein, ur: NEGATIVE mg/dL
Urobilinogen, UA: 0.2 mg/dL (ref 0.0–1.0)

## 2013-06-11 LAB — CBC WITH DIFFERENTIAL/PLATELET
Basophils Relative: 0 % (ref 0–1)
Eosinophils Absolute: 0.1 10*3/uL (ref 0.0–0.7)
HCT: 41.3 % (ref 36.0–46.0)
Hemoglobin: 14.3 g/dL (ref 12.0–15.0)
Lymphs Abs: 2 10*3/uL (ref 0.7–4.0)
MCH: 30.4 pg (ref 26.0–34.0)
MCHC: 34.6 g/dL (ref 30.0–36.0)
Monocytes Absolute: 0.5 10*3/uL (ref 0.1–1.0)
Monocytes Relative: 5 % (ref 3–12)
Neutro Abs: 7.4 10*3/uL (ref 1.7–7.7)

## 2013-06-11 LAB — COMPREHENSIVE METABOLIC PANEL
Albumin: 3.7 g/dL (ref 3.5–5.2)
BUN: 14 mg/dL (ref 6–23)
Chloride: 103 mEq/L (ref 96–112)
Creatinine, Ser: 0.7 mg/dL (ref 0.50–1.10)
GFR calc Af Amer: >90 mL/min (ref >90)
GFR calc non Af Amer: >90 mL/min (ref >90)
Glucose, Bld: 159 mg/dL — ABNORMAL HIGH (ref 70–99)
Total Bilirubin: 0.2 mg/dL — ABNORMAL LOW (ref 0.3–1.2)

## 2013-06-11 LAB — D-DIMER, QUANTITATIVE: D-Dimer, Quant: 0.29 ug/mL-FEU (ref 0.00–0.48)

## 2013-06-11 MED ORDER — SODIUM CHLORIDE 0.9 % IV BOLUS (SEPSIS)
1000.0000 mL | Freq: Once | INTRAVENOUS | Status: AC
  Administered 2013-06-11: 1000 mL via INTRAVENOUS

## 2013-06-11 MED ORDER — PREDNISONE 20 MG PO TABS: 40.0000 mg | ORAL_TABLET | Freq: Every day | ORAL | Status: DC

## 2013-06-11 MED ORDER — ALBUTEROL SULFATE (5 MG/ML) 0.5% IN NEBU
5.0000 mg | INHALATION_SOLUTION | Freq: Once | RESPIRATORY_TRACT | Status: AC
  Administered 2013-06-11: 5 mg via RESPIRATORY_TRACT
  Filled 2013-06-11: qty 1

## 2013-06-11 MED ORDER — METHYLPREDNISOLONE SODIUM SUCC 125 MG IJ SOLR
125.0000 mg | Freq: Once | INTRAMUSCULAR | Status: AC
  Administered 2013-06-11: 125 mg via INTRAVENOUS
  Filled 2013-06-11: qty 2

## 2013-06-11 MED ORDER — ALBUTEROL SULFATE HFA 108 (90 BASE) MCG/ACT IN AERS: 1.0000 | INHALATION_SPRAY | Freq: Four times a day (QID) | RESPIRATORY_TRACT | Status: DC | PRN

## 2013-06-11 MED ORDER — ALPRAZOLAM 0.25 MG PO TABS: 0.2500 mg | ORAL_TABLET | Freq: Every evening | ORAL | Status: DC | PRN

## 2013-06-11 NOTE — ED Notes (Signed)
Pt c/o shortness of breath and "tightness in chest" and fatigue.

## 2013-06-11 NOTE — ED Notes (Signed)
Patient transported to X-ray 

## 2013-06-11 NOTE — ED Provider Notes (Signed)
History    CSN: [id number] Arrival date & time 06/11/13  1606  First MD Initiated Contact with Patient 06/11/13 1618     Chief Complaint  Patient presents with  . Shortness of Breath  . Fatigue   (Consider location/radiation/quality/duration/timing/severity/associated sxs/prior Treatment) HPI Pt with 2 days of fatigue and generalized weakness. No fever. +chills. +cough and nasal congestion. +chest tightness when trying to take deep breath. No lower ext swelling or pain. Pt states back in 80's she needed "shots for her allergies".  Past Medical History  Diagnosis Date  . Hypertension   . Hyperlipidemia   . Exogenous obesity   . Heel spur   . Palpitations   . Hypothyroid   . RSD (reflex sympathetic dystrophy)     after surgery on R foot  . Stress incontinence, female   . Irregular heart beats    Past Surgical History  Procedure Laterality Date  . Abdominal hysterectomy  2005  . Bunionectomy     Family History  Problem Relation Age of Onset  . Arthritis Mother   . Hypertension Mother   . Heart disease Father   . Hypertension Father   . Arthritis Father   . Stroke Father   . Heart failure Brother   . Diabetes Brother   . Pancreatic cancer Brother   . Colon cancer Neg Hx   . Breast cancer Neg Hx    History  Substance Use Topics  . Smoking status: Never Smoker   . Smokeless tobacco: Never Used  . Alcohol Use: No   OB History   Grav Para Term Preterm Abortions TAB SAB Ect Mult Living                 Review of Systems  Constitutional: Positive for chills and fatigue. Negative for fever.  HENT: Positive for congestion, rhinorrhea and sinus pressure. Negative for sore throat, trouble swallowing, neck pain and neck stiffness.   Respiratory: Positive for cough, chest tightness and shortness of breath. Negative for wheezing.   Cardiovascular: Negative for chest pain, palpitations and leg swelling.  Gastrointestinal: Negative for nausea, vomiting, abdominal pain and  diarrhea.  Genitourinary: Negative for dysuria, hematuria and flank pain.  Musculoskeletal: Positive for myalgias and back pain.  Skin: Negative for pallor, rash and wound.  Neurological: Negative for dizziness, syncope, light-headedness, numbness and headaches.  All other systems reviewed and are negative.    Allergies  Lovastatin  Home Medications   Current Outpatient Rx  Name  Route  Sig  Dispense  Refill  . albuterol (PROVENTIL HFA;VENTOLIN HFA) 108 (90 BASE) MCG/ACT inhaler   Inhalation   Inhale 1-2 puffs into the lungs every 6 (six) hours as needed for shortness of breath.   1 Inhaler   0   . ALPRAZolam (XANAX) 0.25 MG tablet   Oral   Take 1 tablet (0.25 mg total) by mouth at bedtime as needed for sleep.   30 tablet   0   . atenolol (TENORMIN) 25 MG tablet   Oral   Take 0.5 tablets (12.5 mg total) by mouth daily.   90 tablet   3   . b complex vitamins tablet   Oral   Take 1 tablet by mouth daily.           . Calcium Carbonate-Vitamin D (CALTRATE 600+D PO)   Oral   Take by mouth daily.           . Cholecalciferol (VITAMIN D) 2000 UNITS CAPS  Oral   Take 1 capsule by mouth 2 (two) times daily.           Marland Kitchen co-enzyme Q-10 30 MG capsule   Oral   Take 30 mg by mouth as needed.           Marland Kitchen DHEA 25 MG CAPS   Oral   Take by mouth daily.           Marland Kitchen estradiol (VIVELLE-DOT) 0.1 MG/24HR   Transdermal   Place 1 patch onto the skin 2 (two) times a week.         . Magnesium 250 MG TABS   Oral   Take by mouth daily.           . Multiple Vitamins-Minerals (CENTRUM SILVER ULTRA WOMENS) TABS   Oral   Take by mouth daily.           . predniSONE (DELTASONE) 20 MG tablet   Oral   Take 2 tablets (40 mg total) by mouth daily.   10 tablet   0   . Pregnenolone POWD   Does not apply   100 mg by Does not apply route daily.           . progesterone (PROMETRIUM) 200 MG capsule   Oral   Take 200 mg by mouth daily.           Marland Kitchen thyroid  (ARMOUR) 60 MG tablet      90mg  in AM and 60mg  in PM         . vitamin A 8000 UNIT capsule   Oral   Take 8,000 Units by mouth daily.            BP 151/82  Pulse 95  Temp(Src) 98.6 F (37 C) (Oral)  Resp 20  SpO2 100% Physical Exam  Nursing note and vitals reviewed. Constitutional: She is oriented to person, place, and time. She appears well-developed and well-nourished. No distress.  HENT:  Head: Normocephalic and atraumatic.  Mouth/Throat: Oropharynx is clear and moist. No oropharyngeal exudate.  bl nasal turbinate swelling  Eyes: EOM are normal. Pupils are equal, round, and reactive to light.  Neck: Normal range of motion. Neck supple.  No meningismus   Cardiovascular: Normal rate and regular rhythm.   Pulmonary/Chest: Effort normal and breath sounds normal. No respiratory distress. She has no wheezes. She has no rales. She exhibits no tenderness.  Abdominal: Soft. Bowel sounds are normal. She exhibits no distension and no mass. There is no tenderness. There is no rebound and no guarding.  Musculoskeletal: Normal range of motion. She exhibits no edema and no tenderness.  No calf swelling or pain  Lymphadenopathy:    She has no cervical adenopathy.  Neurological: She is alert and oriented to person, place, and time.  5/5 motor in all ext, sensation intact  Skin: Skin is warm and dry. No rash noted. No erythema.  Psychiatric:  tearful    ED Course  Procedures (including critical care time) Labs Reviewed  COMPREHENSIVE METABOLIC PANEL - Abnormal; Notable for the following:    Glucose, Bld 159 (*)    Total Bilirubin 0.2 (*)    All other components within normal limits  URINALYSIS, ROUTINE W REFLEX MICROSCOPIC - Abnormal; Notable for the following:    Specific Gravity, Urine 1.004 (*)    All other components within normal limits  TROPONIN I  CBC WITH DIFFERENTIAL  D-DIMER, QUANTITATIVE   Dg Chest 2 View  06/11/2013   *RADIOLOGY REPORT*  Clinical Data: Shortness  of breath.  Weakness.  Hypertension. Chest tightness.  Fatigue.  CHEST - 2 VIEW  Comparison: Report from 01/21/2003  Findings: Cardiac and mediastinal contours appear normal.  The lungs appear clear.  No pleural effusion is identified.  Mild thoracic spondylosis noted.  IMPRESSION:  1.  Mild thoracic spondylosis.   Otherwise, no significant abnormality identified.   Original Report Authenticated By: Gaylyn Rong, M.D.   1. Seasonal allergies   2. Dyspnea   3. Anxiety      Date: 06/11/2013  Rate: 92  Rhythm: normal sinus rhythm  QRS Axis: normal  Intervals: normal  ST/T Wave abnormalities: normal  Conduction Disutrbances:none  Narrative Interpretation:   Old EKG Reviewed: changes noted T wave in III is upright compared to EKG 10/27/05  MDM   Though suspect some of pt symptoms likely due to seasonal allergies, anxiety and stress is likely the cause of the majority of symptoms. Pt admits to being stressed at all times. As I walked into the room, Her HR steadily climbed form 70's to 100's and she becomes increasing tremulous and anxious. I have advised stress reduction techniques and need for further outpatient treatment.  Loren Racer, MD 06/11/13 1736

## 2013-06-11 NOTE — Telephone Encounter (Signed)
Patient notified as instructed by telephone.Pt said she does not think she is that bad today and if her condition changes or worsens she will go to ED.

## 2013-06-11 NOTE — ED Notes (Signed)
MD at bedside. 

## 2013-06-11 NOTE — Telephone Encounter (Signed)
Pt has appt scheduled tomorrow with Dr. Alphonsus Sias

## 2013-06-11 NOTE — Telephone Encounter (Signed)
If she is SOB, then she should go to ER, call 911. I would not come to the office.

## 2013-06-11 NOTE — Telephone Encounter (Signed)
Then schedule f/u (non)emergently here, ie later in the week.  If SOB, to ER.

## 2013-06-11 NOTE — Telephone Encounter (Signed)
Patient Information:  Caller Name: Dalinda  Phone: [phone]  Patient: Kim Brown, Kim Brown  Gender: Female  DOB: 1951-06-09  Age: 62 Years  PCP: Crawford Givens Clelia Croft) [facility])  Office Follow Up:  Does the office need to follow up with this patient?: Yes  Instructions For The Office: I pt could be worked in please f/u with her.  RN Note:  Pt refused 911 or to go to ED for evaluation.  Pt requested appt today or tomorrow.  Appt scheduled for 06/12/13.   Symptoms  Reason For Call & Symptoms: Pt states she is having problems with breathing, unable to take deep breath.  Reviewed Health History In EMR: Yes  Reviewed Medications In EMR: Yes  Reviewed Allergies In EMR: Yes  Reviewed Surgeries / Procedures: Yes  Date of Onset of Symptoms: 06/10/2013  Guideline(s) Used:  Breathing Difficulty  Disposition Per Guideline:   Call EMS 911 Now  Reason For Disposition Reached:   Slow, shallow and weak breathing  Advice Given:  N/A  Patient Refused Recommendation:  Patient Refused Care Advice  Pt refused to call 911 and wants appt with Dr Para March today.

## 2013-06-12 ENCOUNTER — Ambulatory Visit: Payer: Self-pay | Admitting: Internal Medicine

## 2013-06-19 ENCOUNTER — Ambulatory Visit (INDEPENDENT_AMBULATORY_CARE_PROVIDER_SITE_OTHER): Payer: BC Managed Care – PPO | Admitting: Family Medicine

## 2013-06-19 ENCOUNTER — Encounter: Payer: Self-pay | Admitting: Family Medicine

## 2013-06-19 VITALS — BP 128/78 | HR 86 | Temp 98.0°F | Wt 170.5 lb

## 2013-06-19 DIAGNOSIS — R0602 Shortness of breath: Secondary | ICD-10-CM

## 2013-06-19 DIAGNOSIS — K5900 Constipation, unspecified: Secondary | ICD-10-CM

## 2013-06-19 MED ORDER — ALPRAZOLAM 0.25 MG PO TABS: ORAL_TABLET | ORAL | Status: DC

## 2013-06-19 NOTE — Progress Notes (Signed)
She had seen [facility] and I'll defer mgmt of labs to that clinic.  I have not ordered or managed those labs. D/w pt.    She was in the ER with SOB, was tearful at that point.  She had difficulty getting a deep breath.  She has just finished the pred taper now.  She gets jittery when using SABA.  Given rx for xanax in the ER.  That helps some.  She feels some better today than yesterday, better yesterday than the day before.    She had some rhinorrhea, L ear felt stopped up.  Some thick postnasal gtt noted.  No sputum coughed up.  She can walk for a mile or more now (pushing through) but she'll get winded after being up and walking for a short period.  She has been more irritable.    Unclear to patient if SOB --> anxiety or anxiety --> SOB.    Prev was on allergy shots.  Nonsmoker.    She has been constipated recently.  This is a change.  No blood in stool.  She feels like the stools is pushing into the back wall of her vagina.  Discussed.   Meds, vitals, and allergies reviewed.   ROS: See HPI.  Otherwise, noncontributory.  GEN: nad, alert and oriented HEENT: mucous membranes moist, tm wnl, nasal exam with slight clear dischrage NECK: supple w/o LA CV: rrr.  PULM: ctab, no inc wob ABD: soft, +bs EXT: no edema SKIN: no acute rash

## 2013-06-19 NOTE — Patient Instructions (Addendum)
Call GI about follow up.  Finish the xanax you have and get some rest.  If you have more symptoms after stopping the xanax, use the refill and notify me.  Take care.

## 2013-06-22 DIAGNOSIS — K5900 Constipation, unspecified: Secondary | ICD-10-CM | POA: Insufficient documentation

## 2013-06-22 DIAGNOSIS — R0609 Other forms of dyspnea: Secondary | ICD-10-CM | POA: Insufficient documentation

## 2013-06-22 NOTE — Assessment & Plan Note (Signed)
She feels the rectum pushing into the posterior vagina.  I would have her f/u with GI and then she may need to see GYN.  It sounds to be a rectocele and even if she had a normal exam today, I would have her continue with that plan.  Discussed.  Exam deferred.

## 2013-06-22 NOTE — Assessment & Plan Note (Signed)
Likely anxiety related.  Studies neg for ominous dx at ER.  D/w pt. Would use BZD for now and report back.  I gave her another rx to use in the interval if needed.  She agrees.  Likely not a CP source. >25 min spent with face to face with patient, >50% counseling and/or coordinating care

## 2013-07-08 ENCOUNTER — Ambulatory Visit: Payer: BC Managed Care – PPO | Admitting: Family Medicine

## 2013-10-23 ENCOUNTER — Other Ambulatory Visit: Payer: Self-pay

## 2013-11-28 ENCOUNTER — Encounter: Payer: Self-pay | Admitting: Family Medicine

## 2013-12-01 ENCOUNTER — Encounter: Payer: Self-pay | Admitting: *Deleted

## 2015-10-06 ENCOUNTER — Encounter: Payer: Self-pay | Admitting: Family Medicine

## 2015-10-06 ENCOUNTER — Encounter: Payer: Self-pay | Admitting: *Deleted

## 2016-11-28 DIAGNOSIS — Z1231 Encounter for screening mammogram for malignant neoplasm of breast: Secondary | ICD-10-CM | POA: Diagnosis not present

## 2016-11-29 ENCOUNTER — Encounter: Payer: Self-pay | Admitting: Family Medicine

## 2016-11-30 ENCOUNTER — Encounter: Payer: Self-pay | Admitting: *Deleted

## 2017-01-16 DIAGNOSIS — H5203 Hypermetropia, bilateral: Secondary | ICD-10-CM | POA: Diagnosis not present

## 2017-03-20 ENCOUNTER — Encounter: Payer: Self-pay | Admitting: Family Medicine

## 2017-03-20 ENCOUNTER — Ambulatory Visit (INDEPENDENT_AMBULATORY_CARE_PROVIDER_SITE_OTHER): Payer: PPO | Admitting: Family Medicine

## 2017-03-20 VITALS — BP 124/80 | HR 82 | Temp 98.3°F | Ht 63.5 in | Wt 181.5 lb

## 2017-03-20 DIAGNOSIS — R0602 Shortness of breath: Secondary | ICD-10-CM

## 2017-03-20 DIAGNOSIS — E785 Hyperlipidemia, unspecified: Secondary | ICD-10-CM

## 2017-03-20 DIAGNOSIS — Z23 Encounter for immunization: Secondary | ICD-10-CM

## 2017-03-20 DIAGNOSIS — Z0000 Encounter for general adult medical examination without abnormal findings: Secondary | ICD-10-CM | POA: Diagnosis not present

## 2017-03-20 DIAGNOSIS — Z1211 Encounter for screening for malignant neoplasm of colon: Secondary | ICD-10-CM

## 2017-03-20 DIAGNOSIS — E7800 Pure hypercholesterolemia, unspecified: Secondary | ICD-10-CM

## 2017-03-20 DIAGNOSIS — E2839 Other primary ovarian failure: Secondary | ICD-10-CM

## 2017-03-20 DIAGNOSIS — Z7189 Other specified counseling: Secondary | ICD-10-CM

## 2017-03-20 MED ORDER — HYDROXYZINE HCL 10 MG PO TABS: 10.0000 mg | ORAL_TABLET | Freq: Two times a day (BID) | ORAL | 1 refills | Status: DC | PRN

## 2017-03-20 NOTE — Progress Notes (Signed)
I have personally reviewed the Medicare Annual Wellness questionnaire and have noted 1. The patient's medical and social history 2. Their use of alcohol, tobacco or illicit drugs 3. Their current medications and supplements 4. The patient's functional ability including ADL's, fall risks, home safety risks and hearing or visual             impairment. 5. Diet and physical activities 6. Evidence for depression or mood disorders  The patients weight, height, BMI have been recorded in the chart and visual acuity is per eye clinic.  I have made referrals, counseling and provided education to the patient based review of the above and I have provided the pt with a written personalized care plan for preventive services.  Provider list updated- see scanned forms.  Routine anticipatory guidance given to patient.  See health maintenance. The possibility exists that previously documented standard health maintenance information may have been brought forward from a previous encounter into this note.  If needed, that same information has been updated to reflect the current situation based on today's encounter.    Flu d/w pt. Encouraged.  Shingles d/w pt.  See AVS.  PNA 2018 Tetanus d/w pt.  See AVS.  Colonoscopy - referred.  Breast cancer screening 2017.  DXA pending, d/w pt.   Has a living will.  Husband is designated if patient were incapacitated.    Cognitive function addressed- see scanned forms- and if abnormal then additional documentation follows.   [facility]  h/o HLD, will return for labs, statin intolerant.    Followed by outside clinic for thyroid, etc.  I'll defer to patient, d/w pt.   Prev/episodically SOB.  Unclear to patient if SOB --> anxiety or anxiety --> SOB.  Unclear if allergies contribute, seasonally.  Prev was on allergy shots with some relief.  Nonsmoker.  Her last episode was about 2 weeks.  "When I get stressed out, it happens."  No wheeze.  No  cough, no sputum.  Had used BZD rarely.  She can exert w/o troubles.    She has continued foot pain.  She is weighing options.  She has seen Dr. Stevie Kern in the past.    PMH and SH reviewed  Meds, vitals, and allergies reviewed.   ROS: Per HPI.  Unless specifically indicated otherwise in HPI, the patient denies:  General: fever. Eyes: acute vision changes ENT: sore throat Cardiovascular: chest pain Respiratory: SOB GI: vomiting GU: dysuria Musculoskeletal: acute back pain Derm: acute rash Neuro: acute motor dysfunction Psych: worsening mood Endocrine: polydipsia Heme: bleeding Allergy: hayfever  GEN: nad, alert and oriented HEENT: mucous membranes moist NECK: supple w/o LA CV: rrr. PULM: ctab, no inc wob ABD: soft, +bs EXT: no edema SKIN: no acute rash

## 2017-03-20 NOTE — Progress Notes (Signed)
Pre visit review using our clinic review tool, if applicable. No additional management support is needed unless otherwise documented below in the visit note. 

## 2017-03-20 NOTE — Patient Instructions (Addendum)
Check with your insurance to see if they will cover the shingles and tetanus shots.   Fasting labs when possible.   Rosaria Ferries will call about your referral (GI, bone density).  Try hydroxyzine for the shortness of breath, see if that helps.  Take care.  Glad to see you.

## 2017-03-21 ENCOUNTER — Encounter: Payer: Self-pay | Admitting: Internal Medicine

## 2017-03-21 ENCOUNTER — Telehealth: Payer: Self-pay

## 2017-03-21 DIAGNOSIS — Z7189 Other specified counseling: Secondary | ICD-10-CM | POA: Insufficient documentation

## 2017-03-21 NOTE — Assessment & Plan Note (Signed)
She admits more likely to have sx when under stressful circumstances.  Prev used BZD with relief.  Likely better option to try atarax in the meantime as needed and update me as needed.  Okay for outpatient f/u.

## 2017-03-21 NOTE — Telephone Encounter (Signed)
Form completed on CMM. Waiting on response

## 2017-03-21 NOTE — Assessment & Plan Note (Addendum)
Flu d/w pt. Encouraged.  Shingles d/w pt.  See AVS.  PNA 2018 Tetanus d/w pt.  See AVS.  Colonoscopy - referred.  Breast cancer screening 2017.  DXA pending, d/w pt.   Has a living will.  Husband is designated if patient were incapacitated.    Cognitive function addressed- see scanned forms- and if abnormal then additional documentation follows.   [facility]

## 2017-03-21 NOTE — Assessment & Plan Note (Signed)
  Has a living will.  Husband is designated if patient were incapacitated.

## 2017-03-21 NOTE — Assessment & Plan Note (Signed)
Return for labs.  

## 2017-03-22 NOTE — Telephone Encounter (Signed)
PA approved until 12/17/17.

## 2017-04-03 ENCOUNTER — Other Ambulatory Visit (INDEPENDENT_AMBULATORY_CARE_PROVIDER_SITE_OTHER): Payer: PPO

## 2017-04-03 ENCOUNTER — Ambulatory Visit: Payer: Self-pay

## 2017-04-03 DIAGNOSIS — E785 Hyperlipidemia, unspecified: Secondary | ICD-10-CM | POA: Diagnosis not present

## 2017-04-03 LAB — COMPREHENSIVE METABOLIC PANEL
ALT: 13 U/L (ref 0–35)
AST: 16 U/L (ref 0–37)
Albumin: 4.4 g/dL (ref 3.5–5.2)
Alkaline Phosphatase: 73 U/L (ref 39–117)
BUN: 14 mg/dL (ref 6–23)
CHLORIDE: 103 meq/L (ref 96–112)
CO2: 28 meq/L (ref 19–32)
Calcium: 9.4 mg/dL (ref 8.4–10.5)
Creatinine, Ser: 0.84 mg/dL (ref 0.40–1.20)
GFR: 72.17 mL/min (ref >60.00)
Glucose, Bld: 111 mg/dL — ABNORMAL HIGH (ref 70–99)
Potassium: 4.3 mEq/L (ref 3.5–5.1)
SODIUM: 137 meq/L (ref 135–145)
Total Bilirubin: 0.4 mg/dL (ref 0.2–1.2)
Total Protein: 7.4 g/dL (ref 6.0–8.3)

## 2017-04-03 LAB — CBC WITH DIFFERENTIAL/PLATELET
BASOS PCT: 0.7 % (ref 0.0–3.0)
Basophils Absolute: 0.1 10*3/uL (ref 0.0–0.1)
EOS ABS: 0.2 10*3/uL (ref 0.0–0.7)
Eosinophils Relative: 2 % (ref 0.0–5.0)
HCT: 45 % (ref 36.0–46.0)
Hemoglobin: 15.3 g/dL — ABNORMAL HIGH (ref 12.0–15.0)
LYMPHS ABS: 1.8 10*3/uL (ref 0.7–4.0)
Lymphocytes Relative: 21.3 % (ref 12.0–46.0)
MCHC: 33.9 g/dL (ref 30.0–36.0)
MCV: 90.9 fl (ref 78.0–100.0)
MONO ABS: 0.5 10*3/uL (ref 0.1–1.0)
Monocytes Relative: 5.4 % (ref 3.0–12.0)
NEUTROS ABS: 6 10*3/uL (ref 1.4–7.7)
NEUTROS PCT: 70.6 % (ref 43.0–77.0)
PLATELETS: 304 10*3/uL (ref 150.0–400.0)
RBC: 4.95 Mil/uL (ref 3.87–5.11)
RDW: 14.2 % (ref 11.5–15.5)
WBC: 8.5 10*3/uL (ref 4.0–10.5)

## 2017-04-03 LAB — LIPID PANEL
CHOLESTEROL: 237 mg/dL — AB (ref 0–200)
HDL: 40.4 mg/dL (ref >39.00)
NonHDL: 196.87
TRIGLYCERIDES: 216 mg/dL — AB (ref 0.0–149.0)
Total CHOL/HDL Ratio: 6
VLDL: 43.2 mg/dL — ABNORMAL HIGH (ref 0.0–40.0)

## 2017-04-03 LAB — LDL CHOLESTEROL, DIRECT: Direct LDL: 160 mg/dL

## 2017-04-05 ENCOUNTER — Encounter: Payer: Self-pay | Admitting: *Deleted

## 2017-04-05 ENCOUNTER — Other Ambulatory Visit: Payer: Self-pay | Admitting: Family Medicine

## 2017-04-05 DIAGNOSIS — E7800 Pure hypercholesterolemia, unspecified: Secondary | ICD-10-CM

## 2017-04-10 DIAGNOSIS — Z780 Asymptomatic menopausal state: Secondary | ICD-10-CM | POA: Diagnosis not present

## 2017-04-16 ENCOUNTER — Encounter: Payer: Self-pay | Admitting: Family Medicine

## 2017-04-17 ENCOUNTER — Encounter: Payer: Self-pay | Admitting: Family Medicine

## 2017-04-19 ENCOUNTER — Encounter: Payer: Self-pay | Admitting: *Deleted

## 2017-05-17 ENCOUNTER — Ambulatory Visit (AMBULATORY_SURGERY_CENTER): Payer: Self-pay | Admitting: *Deleted

## 2017-05-17 VITALS — Ht 64.0 in | Wt 180.0 lb

## 2017-05-17 DIAGNOSIS — Z1211 Encounter for screening for malignant neoplasm of colon: Secondary | ICD-10-CM

## 2017-05-17 MED ORDER — NA SULFATE-K SULFATE-MG SULF 17.5-3.13-1.6 GM/177ML PO SOLN: 1.0000 | Freq: Once | ORAL | 0 refills | Status: AC

## 2017-05-17 NOTE — Progress Notes (Signed)
No egg or soy allergy known to patient  No issues with past sedation with any surgeries  or procedures, no intubation problems  No diet pills per patient No home 02 use per patient  No blood thinners per patient  Pt denies issues with constipation  No A fib or A flutter  EMMI video sent to pt's e mail  

## 2017-05-22 ENCOUNTER — Encounter: Payer: Self-pay | Admitting: Internal Medicine

## 2017-05-31 ENCOUNTER — Encounter: Payer: Self-pay | Admitting: Internal Medicine

## 2017-05-31 ENCOUNTER — Ambulatory Visit (AMBULATORY_SURGERY_CENTER): Payer: PPO | Admitting: Internal Medicine

## 2017-05-31 VITALS — BP 132/81 | HR 75 | Temp 98.6°F | Resp 21 | Ht 64.0 in | Wt 180.0 lb

## 2017-05-31 DIAGNOSIS — K621 Rectal polyp: Secondary | ICD-10-CM

## 2017-05-31 DIAGNOSIS — D128 Benign neoplasm of rectum: Secondary | ICD-10-CM

## 2017-05-31 DIAGNOSIS — Z86010 Personal history of colonic polyps: Secondary | ICD-10-CM | POA: Diagnosis not present

## 2017-05-31 DIAGNOSIS — Z1211 Encounter for screening for malignant neoplasm of colon: Secondary | ICD-10-CM

## 2017-05-31 DIAGNOSIS — Z1212 Encounter for screening for malignant neoplasm of rectum: Secondary | ICD-10-CM

## 2017-05-31 MED ORDER — SODIUM CHLORIDE 0.9 % IV SOLN: 500.0000 mL | INTRAVENOUS | Status: DC

## 2017-05-31 NOTE — Progress Notes (Signed)
Alert and oriented x3, pleased with MAC, report to RN celia 

## 2017-05-31 NOTE — Patient Instructions (Signed)
Discharge instructions given. Handouts on polyps,diverticulosis and hemorrhoids. Resume previous medications. YOU HAD AN ENDOSCOPIC PROCEDURE TODAY AT THE [facility]:   Refer to the procedure report that was given to you for any specific questions about what was found during the examination.  If the procedure report does not answer your questions, please call your gastroenterologist to clarify.  If you requested that your care partner not be given the details of your procedure findings, then the procedure report has been included in a sealed envelope for you to review at your convenience later.  YOU SHOULD EXPECT: Some feelings of bloating in the abdomen. Passage of more gas than usual.  Walking can help get rid of the air that was put into your GI tract during the procedure and reduce the bloating. If you had a lower endoscopy (such as a colonoscopy or flexible sigmoidoscopy) you may notice spotting of blood in your stool or on the toilet paper. If you underwent a bowel prep for your procedure, you may not have a normal bowel movement for a few days.  Please Note:  You might notice some irritation and congestion in your nose or some drainage.  This is from the oxygen used during your procedure.  There is no need for concern and it should clear up in a day or so.  SYMPTOMS TO REPORT IMMEDIATELY:   Following lower endoscopy (colonoscopy or flexible sigmoidoscopy):  Excessive amounts of blood in the stool  Significant tenderness or worsening of abdominal pains  Swelling of the abdomen that is new, acute  Fever of 100F or higher   For urgent or emergent issues, a gastroenterologist can be reached at any hour by calling [phone].   DIET:  We do recommend a small meal at first, but then you may proceed to your regular diet.  Drink plenty of fluids but you should avoid alcoholic beverages for 24 hours.  ACTIVITY:  You should plan to take it easy for the rest of today and you  should NOT DRIVE or use heavy machinery until tomorrow (because of the sedation medicines used during the test).    FOLLOW UP: Our staff will call the number listed on your records the next business day following your procedure to check on you and address any questions or concerns that you may have regarding the information given to you following your procedure. If we do not reach you, we will leave a message.  However, if you are feeling well and you are not experiencing any problems, there is no need to return our call.  We will assume that you have returned to your regular daily activities without incident.  If any biopsies were taken you will be contacted by phone or by letter within the next 1-3 weeks.  Please call us at [phone] if you have not heard about the biopsies in 3 weeks.    SIGNATURES/CONFIDENTIALITY: You and/or your care partner have signed paperwork which will be entered into your electronic medical record.  These signatures attest to the fact that that the information above on your After Visit Summary has been reviewed and is understood.  Full responsibility of the confidentiality of this discharge information lies with you and/or your care-partner. 

## 2017-05-31 NOTE — Progress Notes (Signed)
Called to room to assist during endoscopic procedure.  Patient ID and intended procedure confirmed with present staff. Received instructions for my participation in the procedure from the performing physician.  

## 2017-05-31 NOTE — Op Note (Signed)
[place] Patient Name: Kim Brown Procedure Date: 05/31/2017 10:47 AM MRN: [id number] Endoscopist: Jerene Bears , MD Age: 66 Referring MD:  Date of Birth: 09-17-1951 Gender: Female Account #: [id number] Procedure:                Colonoscopy Indications:              Screening for colorectal malignant neoplasm, Last                            colonoscopy 10 years ago Medicines:                Monitored Anesthesia Care Procedure:                Pre-Anesthesia Assessment:                           - Prior to the procedure, a History and Physical                            was performed, and patient medications and                            allergies were reviewed. The patient's tolerance of                            previous anesthesia was also reviewed. The risks                            and benefits of the procedure and the sedation                            options and risks were discussed with the patient.                            All questions were answered, and informed consent                            was obtained. Prior Anticoagulants: The patient has                            taken no previous anticoagulant or antiplatelet                            agents. ASA Grade Assessment: II - A patient with                            mild systemic disease. After reviewing the risks                            and benefits, the patient was deemed in                            satisfactory condition to undergo the procedure.  After obtaining informed consent, the colonoscope                            was passed under direct vision. Throughout the                            procedure, the patient's blood pressure, pulse, and                            oxygen saturations were monitored continuously. The                            Colonoscope was introduced through the anus and                            advanced to the the cecum, identified  by                            appendiceal orifice and ileocecal valve. The                            colonoscopy was performed without difficulty. The                            patient tolerated the procedure well. The quality                            of the bowel preparation was good. The terminal                            ileum, ileocecal valve, appendiceal orifice, and                            rectum were photographed. Scope In: 11:00:21 AM Scope Out: 11:16:44 AM Scope Withdrawal Time: 0 hours 7 minutes 58 seconds  Total Procedure Duration: 0 hours 16 minutes 23 seconds  Findings:                 The digital rectal exam was normal.                           A 5 mm polyp was found in the rectum. The polyp was                            sessile. The polyp was removed with a cold snare.                            Resection and retrieval were complete.                           Multiple small and large-mouthed diverticula were                            found in the sigmoid colon and splenic flexure.  An area of mild melanosis was found in the entire                            colon.                           Internal hemorrhoids were found during                            retroflexion. The hemorrhoids were small. Complications:            No immediate complications. Estimated Blood Loss:     Estimated blood loss was minimal. Impression:               - One 5 mm polyp in the rectum, removed with a cold                            snare. Resected and retrieved.                           - Mild diverticulosis in the sigmoid colon and at                            the splenic flexure.                           - Melanosis in the colon.                           - Internal hemorrhoids. Recommendation:           - Patient has a contact number available for                            emergencies. The signs and symptoms of potential                             delayed complications were discussed with the                            patient. Return to normal activities tomorrow.                            Written discharge instructions were provided to the                            patient.                           - Resume previous diet.                           - Continue present medications.                           - Await pathology results.                           -  Repeat colonoscopy is recommended. The                            colonoscopy date will be determined after pathology                            results from today's exam become available for                            review. Jerene Bears, MD 05/31/2017 11:19:59 AM This report has been signed electronically.

## 2017-05-31 NOTE — Progress Notes (Signed)
Pt's states no medical or surgical changes since previsit or office visit. 

## 2017-06-01 ENCOUNTER — Telehealth: Payer: Self-pay | Admitting: *Deleted

## 2017-06-01 NOTE — Telephone Encounter (Signed)
  Follow up Call-  Call back number 05/31/2017  Post procedure Call Back phone  # [phone]  Permission to leave phone message Yes  Some recent data might be hidden     Patient questions:  Do you have a fever, pain , or abdominal swelling? No. Pain Score  0 *  Have you tolerated food without any problems? Yes.    Have you been able to return to your normal activities? Yes.    Do you have any questions about your discharge instructions: Diet   No. Medications  No. Follow up visit  No.  Do you have questions or concerns about your Care? No.  Actions: * If pain score is 4 or above: No action needed, pain <4.

## 2017-06-05 ENCOUNTER — Encounter: Payer: Self-pay | Admitting: Internal Medicine

## 2017-10-02 ENCOUNTER — Other Ambulatory Visit (INDEPENDENT_AMBULATORY_CARE_PROVIDER_SITE_OTHER): Payer: PPO

## 2017-10-02 DIAGNOSIS — E7800 Pure hypercholesterolemia, unspecified: Secondary | ICD-10-CM | POA: Diagnosis not present

## 2017-10-02 LAB — LDL CHOLESTEROL, DIRECT: Direct LDL: 159 mg/dL

## 2017-10-02 LAB — LIPID PANEL
CHOL/HDL RATIO: 6
Cholesterol: 225 mg/dL — ABNORMAL HIGH (ref 0–200)
HDL: 36.9 mg/dL — ABNORMAL LOW (ref >39.00)
NONHDL: 187.93
TRIGLYCERIDES: 209 mg/dL — AB (ref 0.0–149.0)
VLDL: 41.8 mg/dL — ABNORMAL HIGH (ref 0.0–40.0)

## 2017-10-02 LAB — GLUCOSE, RANDOM: Glucose, Bld: 108 mg/dL — ABNORMAL HIGH (ref 70–99)

## 2017-11-16 DIAGNOSIS — L2089 Other atopic dermatitis: Secondary | ICD-10-CM | POA: Diagnosis not present

## 2017-11-26 ENCOUNTER — Encounter: Payer: Self-pay | Admitting: Family Medicine

## 2017-12-02 ENCOUNTER — Other Ambulatory Visit: Payer: Self-pay | Admitting: Family Medicine

## 2017-12-02 DIAGNOSIS — Z9109 Other allergy status, other than to drugs and biological substances: Secondary | ICD-10-CM

## 2018-01-29 ENCOUNTER — Encounter: Payer: Self-pay | Admitting: Allergy and Immunology

## 2018-01-29 ENCOUNTER — Ambulatory Visit: Payer: Self-pay | Admitting: Allergy and Immunology

## 2018-01-29 ENCOUNTER — Ambulatory Visit (INDEPENDENT_AMBULATORY_CARE_PROVIDER_SITE_OTHER): Payer: PPO | Admitting: Allergy and Immunology

## 2018-01-29 VITALS — BP 130/86 | HR 100 | Ht 64.0 in | Wt 180.4 lb

## 2018-01-29 DIAGNOSIS — J3089 Other allergic rhinitis: Secondary | ICD-10-CM

## 2018-01-29 DIAGNOSIS — R0609 Other forms of dyspnea: Secondary | ICD-10-CM | POA: Diagnosis not present

## 2018-01-29 DIAGNOSIS — L500 Allergic urticaria: Secondary | ICD-10-CM | POA: Diagnosis not present

## 2018-01-29 DIAGNOSIS — T7840XD Allergy, unspecified, subsequent encounter: Secondary | ICD-10-CM

## 2018-01-29 DIAGNOSIS — T7840XA Allergy, unspecified, initial encounter: Secondary | ICD-10-CM | POA: Insufficient documentation

## 2018-01-29 MED ORDER — AZELASTINE HCL 0.1 % NA SOLN
2.0000 | Freq: Two times a day (BID) | NASAL | 5 refills | Status: DC
Start: 2018-01-29 — End: 2020-04-12

## 2018-01-29 NOTE — Progress Notes (Signed)
New Patient Note  RE: Kim Brown MRN: [id number] DOB: [birth year] Date of Office Visit: 01/29/2018  Referring provider: Tonia Ghent, MD Primary care provider: Tonia Ghent, MD  Chief Complaint: Allergic Rhinitis ; Breathing Problem; and Urticaria   History of present illness: Kim Brown is a 67 y.o. female seen today in consultation requested by Elsie Stain, MD.  She experiences nasal congestion, rhinorrhea, postnasal drainage, sinus pressure, and ocular pruritus.  These symptoms occur year around but may get worse during times of season change.  She notes that on and off since the 1980s she had "breathing problems".  She complains specifically about having difficulty getting "enough air in".  She does not have difficulty with exhalation.  On occasion, she is able to obtain a deep satisfying yawn which relieves the air hunger temporarily.  She has had full pulmonary function tests without a definitive diagnosis.  She was given albuterol in the past, however this medication provided no discernible relief.  She reports that in the spring 2018 she developed hives on her hands while working in the garden.  The hives resolved with topical corticosteroids and oral antihistamines.  She had a similar experience in the fall and then again in December 2018 after she had been playing with her dog.  She went to the urgent care in December 2018 for this problem and the urticaria was treated with topical steroids and loratadine.  She denies concomitant angioedema, cardiopulmonary symptoms, or GI symptoms.    Assessment and plan: Perennial allergic rhinitis  Aeroallergen avoidance measures have been discussed and provided in written form.  A prescription has been provided for azelastine nasal spray, 1-2 sprays per nostril 2 times daily as needed. Proper nasal spray technique has been discussed and demonstrated.   Nasal saline spray (i.e., Simply Saline) or nasal saline lavage (i.e.,  NeilMed) is recommended as needed and prior to medicated nasal sprays.  Fexofenadine (Allegra) 180 mg daily if needed.  Allergic urticaria Unclear etiology. Skin tests to select food allergens were negative today. NSAIDs and emotional stress commonly exacerbate urticaria but are not the underlying etiology in this case. Physical urticarias are negative by history (i.e. pressure-induced, temperature, vibration, solar, etc.). There are no concomitant symptoms concerning for anaphylaxis.   We will not order labs at this time, however, if lesions recur, persist, progress, or change in character, we will assess potential etiologies with screening labs.  For symptom relief, patient is to take oral antihistamines as directed.  Instructions have been discussed and provided for H1/H2 receptor blockade with titration to find lowest effective dose.  Should symptoms recur, a journal is to be kept recording any foods eaten, beverages consumed, medications taken within a 6 hour period prior to the onset of symptoms, as well as record activities being performed, and environmental conditions. For any symptoms concerning for anaphylaxis, 911 is to be called immediately.  Dyspnea The patients sensation of air-hunger, not being able to get a full breath on inspiration, which is relieved by a yawn suggests sighing dyspnea. Other less likely etiologies include vocal cord dysfunction and asthma. The patients spirometry today was normal.  Diaphragmatic breathing, or belly breathing, has been discussed with the patient as this technique often times relieves sighing dyspnea.  If this problem persists or progresses, referral to respiratory therapy or an anxiolytic may be beneficial.  The patients progress will be followed and treatment plan will be adjusted if necessary.    Meds ordered this encounter  Medications  .  azelastine (ASTELIN) 0.1 % nasal spray    Sig: Place 2 sprays into both nostrils 2 (two) times  daily.    Dispense:  30 mL    Refill:  5    Diagnostics: Spirometry: FVC was 2.72 L and FEV1 was 2.45 L (106% predicted).  This study was performed while the patient was asymptomatic.  Please see scanned spirometry results for details. Epicutaneous testing: Negative despite a positive histamine control. Intradermal testing: Positive to molds.    Physical examination: Blood pressure 130/86, pulse 100, height 5\' 4"  (1.626 m), weight 180 lb 6.4 oz (81.8 kg), SpO2 96 %.  General: Alert, interactive, in no acute distress. HEENT: TMs pearly gray, turbinates moderately edematous without discharge, post-pharynx moderately erythematous. Neck: Supple without lymphadenopathy. Lungs: Clear to auscultation without wheezing, rhonchi or rales. CV: Normal S1, S2 without murmurs. Abdomen: Nondistended, nontender. Skin: Warm and dry, without lesions or rashes. Extremities:  No clubbing, cyanosis or edema. Neuro:   Grossly intact.  Review of systems:  Review of systems negative except as noted in HPI / PMHx or noted below: Review of Systems  Constitutional: Negative.   HENT: Negative.   Eyes: Negative.   Respiratory: Negative.   Cardiovascular: Negative.   Gastrointestinal: Negative.   Genitourinary: Negative.   Musculoskeletal: Negative.   Skin: Negative.   Neurological: Negative.   Endo/Heme/Allergies: Negative.   Psychiatric/Behavioral: Negative.     Past medical history:  Past Medical History:  Diagnosis Date  . Allergy   . Anemia   . Arthritis    feet and hands   . Exogenous obesity   . Heel spur   . Hyperlipidemia   . Hypertension   . Hypothyroid   . Irregular heart beats   . Palpitations   . Prolapsed, anus    uses mag oxide powder nightly   . RSD (reflex sympathetic dystrophy)    after surgery on R foot  . Stress incontinence, female     Past surgical history:  Past Surgical History:  Procedure Laterality Date  . ABDOMINAL HYSTERECTOMY  2005  . BUNIONECTOMY      . COLONOSCOPY    . DILATION AND CURETTAGE OF UTERUS      Family history: Family History  Problem Relation Age of Onset  . Arthritis Mother   . Hypertension Mother   . Colon polyps Mother   . Heart disease Father   . Hypertension Father   . Arthritis Father   . Stroke Father   . Heart failure Brother   . Diabetes Brother   . Pancreatic cancer Brother   . Colon cancer Neg Hx   . Breast cancer Neg Hx   . Esophageal cancer Neg Hx   . Rectal cancer Neg Hx   . Stomach cancer Neg Hx     Social history: Social History   Socioeconomic History  . Marital status: Married    Spouse name: Not on file  . Number of children: Not on file  . Years of education: Not on file  . Highest education level: Not on file  Social Needs  . Financial resource strain: Not on file  . Food insecurity - worry: Not on file  . Food insecurity - inability: Not on file  . Transportation needs - medical: Not on file  . Transportation needs - non-medical: Not on file  Occupational History  . Not on file  Tobacco Use  . Smoking status: Never Smoker  . Smokeless tobacco: Never Used  Substance and Sexual Activity  .  Alcohol use: No  . Drug use: No  . Sexual activity: Not on file  Other Topics Concern  . Not on file  Social History Narrative   Married 1972   2 daughters Gari [place] Bells is one) and 1 son   Occ subs at [employer] History: The patient lives in a house with bamboo floors throughout her life/heat.  There is a dog at home which does not have access to her bedroom.  She is a non-smoker though was exposed to secondhand cigarette smoke during childhood.  There is no known mold/water damage in the home.  Allergies as of 01/29/2018      Reactions   Albuterol    Jittery sensation   Lovastatin    Myalgias      Medication List        Accurate as of 01/29/18  6:19 PM. Always use your most recent med list.          azelastine 0.1 % nasal spray Commonly known  as:  ASTELIN Place 2 sprays into both nostrils 2 (two) times daily.   b complex vitamins tablet Take 1 tablet by mouth daily.   CALTRATE 600+D PO Take by mouth daily.   CENTRUM SILVER ULTRA WOMENS Tabs Take by mouth daily.   co-enzyme Q-10 30 MG capsule Take 30 mg by mouth as needed.   DHEA 25 MG Caps Take by mouth daily.   estradiol 0.1 MG/24HR patch Commonly known as:  VIVELLE-DOT Place 1 patch onto the skin. One time weekly.   fish oil-omega-3 fatty acids 1000 MG capsule Take 1 g by mouth daily.   Magnesium 250 MG Tabs Take by mouth daily.   Melatonin 3 MG Tabs Take 10 mg by mouth at bedtime.   Pregnenolone Powd 100 mg by Does not apply route daily.   progesterone 200 MG capsule Commonly known as:  PROMETRIUM Take 200 mg by mouth daily.   thyroid 60 MG tablet Commonly known as:  ARMOUR 90mg  in AM and 60mg  in PM   triamcinolone cream 0.1 % Commonly known as:  KENALOG Apply 1 application topically 2 (two) times daily.   vitamin A 8000 UNIT capsule Take 8,000 Units by mouth daily.   vitamin C 1000 MG tablet Take 1,000 mg by mouth 2 (two) times daily.   Vitamin D 2000 units Caps Take 1 capsule by mouth 2 (two) times daily.       Known medication allergies: Allergies  Allergen Reactions  . Albuterol     Jittery sensation  . Lovastatin     Myalgias    I appreciate the opportunity to take part in Kim Brown's care. Please do not hesitate to contact me with questions.  Sincerely,   R. Edgar Frisk, MD

## 2018-01-29 NOTE — Assessment & Plan Note (Addendum)
The patients sensation of air-hunger, not being able to get a full breath on inspiration, which is relieved by a yawn suggests sighing dyspnea. Other less likely etiologies include vocal cord dysfunction and asthma. The patients spirometry today was normal.  Diaphragmatic breathing, or belly breathing, has been discussed with the patient as this technique often times relieves sighing dyspnea.  If this problem persists or progresses, referral to respiratory therapy or an anxiolytic may be beneficial.  The patients progress will be followed and treatment plan will be adjusted if necessary.

## 2018-01-29 NOTE — Assessment & Plan Note (Signed)
   Aeroallergen avoidance measures have been discussed and provided in written form.  A prescription has been provided for azelastine nasal spray, 1-2 sprays per nostril 2 times daily as needed. Proper nasal spray technique has been discussed and demonstrated.   Nasal saline spray (i.e., Simply Saline) or nasal saline lavage (i.e., NeilMed) is recommended as needed and prior to medicated nasal sprays.  Fexofenadine (Allegra) 180 mg daily if needed.

## 2018-01-29 NOTE — Assessment & Plan Note (Signed)
Unclear etiology. Skin tests to select food allergens were negative today. NSAIDs and emotional stress commonly exacerbate urticaria but are not the underlying etiology in this case. Physical urticarias are negative by history (i.e. pressure-induced, temperature, vibration, solar, etc.). There are no concomitant symptoms concerning for anaphylaxis.   We will not order labs at this time, however, if lesions recur, persist, progress, or change in character, we will assess potential etiologies with screening labs.  For symptom relief, patient is to take oral antihistamines as directed.  Instructions have been discussed and provided for H1/H2 receptor blockade with titration to find lowest effective dose.  Should symptoms recur, a journal is to be kept recording any foods eaten, beverages consumed, medications taken within a 6 hour period prior to the onset of symptoms, as well as record activities being performed, and environmental conditions. For any symptoms concerning for anaphylaxis, 911 is to be called immediately.

## 2018-01-29 NOTE — Patient Instructions (Addendum)
Perennial allergic rhinitis  Aeroallergen avoidance measures have been discussed and provided in written form.  A prescription has been provided for azelastine nasal spray, 1-2 sprays per nostril 2 times daily as needed. Proper nasal spray technique has been discussed and demonstrated.   Nasal saline spray (i.e., Simply Saline) or nasal saline lavage (i.e., NeilMed) is recommended as needed and prior to medicated nasal sprays.  Fexofenadine (Allegra) 180 mg daily if needed.  Allergic urticaria Unclear etiology. Skin tests to select food allergens were negative today. NSAIDs and emotional stress commonly exacerbate urticaria but are not the underlying etiology in this case. Physical urticarias are negative by history (i.e. pressure-induced, temperature, vibration, solar, etc.). There are no concomitant symptoms concerning for anaphylaxis.   We will not order labs at this time, however, if lesions recur, persist, progress, or change in character, we will assess potential etiologies with screening labs.  For symptom relief, patient is to take oral antihistamines as directed.  Instructions have been discussed and provided for H1/H2 receptor blockade with titration to find lowest effective dose.  Should symptoms recur, a journal is to be kept recording any foods eaten, beverages consumed, medications taken within a 6 hour period prior to the onset of symptoms, as well as record activities being performed, and environmental conditions. For any symptoms concerning for anaphylaxis, 911 is to be called immediately.  Dyspnea The patients sensation of air-hunger, not being able to get a full breath on inspiration, which is relieved by a yawn suggests sighing dyspnea. Other less likely etiologies include vocal cord dysfunction and asthma. The patients spirometry today was normal.  Diaphragmatic breathing, or belly breathing, has been discussed with the patient as this technique often times relieves  sighing dyspnea.  If this problem persists or progresses, referral to respiratory therapy or an anxiolytic may be beneficial.  The patients progress will be followed and treatment plan will be adjusted if necessary.    Return in about 4 months (around 05/29/2018), or if symptoms worsen or fail to improve.  Urticaria (Hives)  . Fexofenadine (Allegra) 180 mg once a day.  If symptoms continue then increase to .  Marland Kitchen Fexofenadine (Allegra) 180 mg  twice a day.  If symptoms continue then increase to .  Marland Kitchen Fexofenadine (Allegra) 180 mg  twice a day and Ranitidine (Zantac) 150 mg once a day.  If symptoms continue then increase to.  Marland Kitchen Fexofenadine (Allegra) 180 mg  twice a day and Ranitidine (Zantac) 150 mg twice a day  May use Benadryl as needed for breakthrough symptoms       If no symptoms for 7 days, then step down dosage  Control of Mold Allergen  Mold and fungi can grow on a variety of surfaces provided certain temperature and moisture conditions exist.  Outdoor molds grow on plants, decaying vegetation and soil.  The major outdoor mold, Alternaria and Cladosporium, are found in very high numbers during hot and dry conditions.  Generally, a late Summer - Fall peak is seen for common outdoor fungal spores.  Rain will temporarily lower outdoor mold spore count, but counts rise rapidly when the rainy period ends.  The most important indoor molds are Aspergillus and Penicillium.  Dark, humid and poorly ventilated basements are ideal sites for mold growth.  The next most common sites of mold growth are the bathroom and the kitchen.  Outdoor [employer] 2. Use air conditioning and keep windows closed 3. Avoid exposure to decaying vegetation. 4. Avoid leaf raking. 5. Avoid  grain handling. 6. Consider wearing a face mask if working in moldy areas.  Indoor Mold Control 4. Maintain humidity below 50%. 5. Clean washable surfaces with 5% bleach solution. 6. Remove sources e.g. Contaminated  carpets.

## 2018-04-24 ENCOUNTER — Ambulatory Visit: Payer: PPO

## 2018-04-24 ENCOUNTER — Ambulatory Visit (INDEPENDENT_AMBULATORY_CARE_PROVIDER_SITE_OTHER): Payer: PPO

## 2018-04-24 ENCOUNTER — Other Ambulatory Visit: Payer: Self-pay | Admitting: Family Medicine

## 2018-04-24 VITALS — BP 120/78 | HR 81 | Temp 97.8°F | Ht 63.5 in | Wt 174.5 lb

## 2018-04-24 DIAGNOSIS — E7800 Pure hypercholesterolemia, unspecified: Secondary | ICD-10-CM | POA: Diagnosis not present

## 2018-04-24 DIAGNOSIS — Z23 Encounter for immunization: Secondary | ICD-10-CM | POA: Diagnosis not present

## 2018-04-24 DIAGNOSIS — Z0000 Encounter for general adult medical examination without abnormal findings: Secondary | ICD-10-CM

## 2018-04-24 LAB — COMPREHENSIVE METABOLIC PANEL
ALBUMIN: 4.2 g/dL (ref 3.5–5.2)
ALT: 17 U/L (ref 0–35)
AST: 19 U/L (ref 0–37)
Alkaline Phosphatase: 72 U/L (ref 39–117)
BILIRUBIN TOTAL: 0.4 mg/dL (ref 0.2–1.2)
BUN: 12 mg/dL (ref 6–23)
CALCIUM: 9.4 mg/dL (ref 8.4–10.5)
CO2: 29 mEq/L (ref 19–32)
Chloride: 104 mEq/L (ref 96–112)
Creatinine, Ser: 0.83 mg/dL (ref 0.40–1.20)
GFR: 72.94 mL/min (ref >60.00)
Glucose, Bld: 100 mg/dL — ABNORMAL HIGH (ref 70–99)
Potassium: 4.2 mEq/L (ref 3.5–5.1)
Sodium: 139 mEq/L (ref 135–145)
TOTAL PROTEIN: 7.1 g/dL (ref 6.0–8.3)

## 2018-04-24 LAB — LIPID PANEL
CHOLESTEROL: 225 mg/dL — AB (ref 0–200)
HDL: 37.1 mg/dL — ABNORMAL LOW (ref >39.00)
LDL Cholesterol: 148 mg/dL — ABNORMAL HIGH (ref 0–99)
NonHDL: 187.63
TRIGLYCERIDES: 198 mg/dL — AB (ref 0.0–149.0)
Total CHOL/HDL Ratio: 6
VLDL: 39.6 mg/dL (ref 0.0–40.0)

## 2018-04-24 NOTE — Progress Notes (Signed)
PCP notes:   Health maintenance:  Tetanus vaccine - postponed/insurance PPSV23 - administered  Abnormal screenings:   None  Patient concerns:   None  Nurse concerns:  None  Next PCP appt:   04/30/18 @ 1045  I reviewed health advisor's note, was available for consultation, and agree with documentation and plan. Loura Pardon MD

## 2018-04-24 NOTE — Progress Notes (Signed)
Subjective:   Kim Brown is a 67 y.o. female who presents for an Initial Medicare Annual Wellness Visit.  Review of Systems    N/A  Cardiac Risk Factors include: advanced age (>18men, >81 women);obesity (BMI >30kg/m2);dyslipidemia     Objective:    Today's Vitals   04/24/18 1235 04/24/18 1247  BP:  120/78  Pulse:  81  Temp:  97.8 F (36.6 C)  TempSrc:  Oral  SpO2:  96%  Weight:  174 lb 8 oz (79.2 kg)  Height:  5' 3.5" (1.613 m)  PainSc: 2  2   PainLoc:  Finger   Body mass index is 30.43 kg/m.  Advanced Directives 04/24/2018 05/17/2017  Does Patient Have a Medical Advance Directive? Yes Yes  Type of Paramedic of [place];Living will [place];Living will  Copy of [place] in Chart? No - copy requested -    Current Medications (verified) Outpatient Encounter Medications as of 04/24/2018  Medication Sig  . Ascorbic Acid (VITAMIN C) 1000 MG tablet Take 1,000 mg by mouth 2 (two) times daily.  Marland Kitchen azelastine (ASTELIN) 0.1 % nasal spray Place 2 sprays into both nostrils 2 (two) times daily.  Marland Kitchen b complex vitamins tablet Take 1 tablet by mouth daily.    . Calcium Carbonate-Vitamin D (CALTRATE 600+D PO) Take by mouth daily.    . cetirizine (ZYRTEC ALLERGY) 10 MG tablet Take 10 mg by mouth daily.  . Cholecalciferol (VITAMIN D) 2000 UNITS CAPS Take 1 capsule by mouth 2 (two) times daily.    Marland Kitchen co-enzyme Q-10 30 MG capsule Take 30 mg by mouth as needed.    Marland Kitchen DHEA 25 MG CAPS Take by mouth daily.    . fish oil-omega-3 fatty acids 1000 MG capsule Take 1 g by mouth daily.  . Magnesium 250 MG TABS Take by mouth daily.    . Melatonin 3 MG TABS Take 10 mg by mouth at bedtime.   . Multiple Vitamins-Minerals (CENTRUM SILVER ULTRA WOMENS) TABS Take by mouth daily.    . NONFORMULARY OR COMPOUNDED ITEM Estrogen compound   . Pregnenolone POWD 100 mg by Does not apply route daily.    . progesterone (PROMETRIUM) 200 MG capsule Take  200 mg by mouth daily.    Marland Kitchen thyroid (ARMOUR) 60 MG tablet 90mg  in AM and 60mg  in PM  . triamcinolone cream (KENALOG) 0.1 % Apply 1 application topically as needed.   . vitamin A 8000 UNIT capsule Take 8,000 Units by mouth daily.    . [DISCONTINUED] estradiol (VIVELLE-DOT) 0.1 MG/24HR Place 1 patch onto the skin. One time weekly.   Facility-Administered Encounter Medications as of 04/24/2018  Medication  . 0.9 %  sodium chloride infusion    Allergies (verified) Albuterol and Lovastatin   History: Past Medical History:  Diagnosis Date  . Allergy   . Anemia   . Arthritis    feet and hands   . Exogenous obesity   . Heel spur   . Hyperlipidemia   . Hypertension   . Hypothyroid   . Irregular heart beats   . Palpitations   . Prolapsed, anus    uses mag oxide powder nightly   . RSD (reflex sympathetic dystrophy)    after surgery on R foot  . Stress incontinence, female    Past Surgical History:  Procedure Laterality Date  . ABDOMINAL HYSTERECTOMY  2005  . BUNIONECTOMY    . COLONOSCOPY    . DILATION AND CURETTAGE OF  UTERUS     Family History  Problem Relation Age of Onset  . Arthritis Mother   . Hypertension Mother   . Colon polyps Mother   . Heart disease Father   . Hypertension Father   . Arthritis Father   . Stroke Father   . Heart failure Brother   . Diabetes Brother   . Pancreatic cancer Brother   . Colon cancer Neg Hx   . Breast cancer Neg Hx   . Esophageal cancer Neg Hx   . Rectal cancer Neg Hx   . Stomach cancer Neg Hx    Social History   Socioeconomic History  . Marital status: Married    Spouse name: Not on file  . Number of children: Not on file  . Years of education: Not on file  . Highest education level: Not on file  Occupational History  . Not on file  Social Needs  . Financial resource strain: Not on file  . Food insecurity:    Worry: Not on file    Inability: Not on file  . Transportation needs:    Medical: Not on file    Non-medical:  Not on file  Tobacco Use  . Smoking status: Never Smoker  . Smokeless tobacco: Never Used  Substance and Sexual Activity  . Alcohol use: No  . Drug use: No  . Sexual activity: Yes  Lifestyle  . Physical activity:    Days per week: Not on file    Minutes per session: Not on file  . Stress: Not on file  Relationships  . Social connections:    Talks on phone: Not on file    Gets together: Not on file    Attends religious service: Not on file    Active member of club or organization: Not on file    Attends meetings of clubs or organizations: Not on file    Relationship status: Not on file  Other Topics Concern  . Not on file  Social History Narrative   Married 1972   2 daughters (Gari [place] Bells is one) and 1 son   Occ subs at [employer]    Tobacco Counseling Counseling given: No   Clinical Intake:  Pre-visit preparation completed: Yes  Pain Score: 2      Nutritional Status: BMI > 30  Obese Nutritional Risks: None Diabetes: No  How often do you need to have someone help you when you read instructions, pamphlets, or other written materials from your doctor or pharmacy?: 1 - Never What is the last grade level you completed in school?: Associate degree  Interpreter Needed?: No  Comments: pt lives with spouse Information entered by :: LPinson, LPN   Activities of Daily Living In your present state of health, do you have any difficulty performing the following activities: 04/24/2018  Hearing? N  Vision? N  Difficulty concentrating or making decisions? N  Walking or climbing stairs? N  Dressing or bathing? N  Doing errands, shopping? N  Preparing Food and eating ? N  Using the Toilet? N  In the past six months, have you accidently leaked urine? Y  Do you have problems with loss of bowel control? N  Managing your Medications? N  Managing your Finances? N  Housekeeping or managing your Housekeeping? N  Some recent data might be hidden      Immunizations and Health Maintenance Immunization History  Administered Date(s) Administered  . Influenza Split 11/10/2011  . Influenza, Seasonal, Injecte, Preservative Fre 11/18/2012  .  Pneumococcal Conjugate-13 03/20/2017  . Tdap 12/18/2005   There are no preventive care reminders to display for this patient.  Patient Care Team: Tonia Ghent, MD as PCP - General (Family Medicine)     Assessment:   This is a routine wellness examination for [place].  Hearing/Vision screen  Hearing Screening   125Hz  250Hz  500Hz  1000Hz  2000Hz  3000Hz  4000Hz  6000Hz  8000Hz   Right ear:   40 40 40  40    Left ear:   40 40 40  40      Visual Acuity Screening   Right eye Left eye Both eyes  Without correction:     With correction: 20/20 20/20-1 20/15-1    Dietary issues and exercise activities discussed: Current Exercise Habits: Home exercise routine, Type of exercise: stretching;walking, Time (Minutes): 45, Frequency (Times/Week): 5, Weekly Exercise (Minutes/Week): 225, Intensity: Moderate, Exercise limited by: None identified  Goals    . Increase physical activity     Starting 04/24/2018, I will continue to exercise for 20-30 minutes 5 days per week.       Depression Screen PHQ 2/9 Scores 04/24/2018  PHQ - 2 Score 0  PHQ- 9 Score 0    Fall Risk Fall Risk  04/24/2018  Falls in the past year? No    Cognitive Function: MMSE - Mini Mental State Exam 04/24/2018  Orientation to time 5  Orientation to Place 5  Registration 3  Attention/ Calculation 0  Recall 3  Language- name 2 objects 0  Language- repeat 1  Language- follow 3 step command 3  Language- read & follow direction 0  Write a sentence 0  Copy design 0  Total score 20        Screening Tests Health Maintenance  Topic Date Due  . TETANUS/TDAP  04/25/2019 (Originally 12/19/2015)  . INFLUENZA VACCINE  07/18/2018  . MAMMOGRAM  11/28/2018  . COLONOSCOPY  06/01/2027  . DEXA SCAN  Completed  . Hepatitis C Screening   Completed  . PNA vac Low Risk Adult  Completed      Plan:   I have personally reviewed, addressed, and noted the following in the patient's chart:  A. Medical and social history B. Use of alcohol, tobacco or illicit drugs  C. Current medications and supplements D. Functional ability and status E.  Nutritional status F.  Physical activity G. Advance directives H. List of other physicians I.  Hospitalizations, surgeries, and ER visits in previous 12 months J.  [place] to include hearing, vision, cognitive, depression L. Referrals and appointments - none  In addition, I have reviewed and discussed with patient certain preventive protocols, quality metrics, and best practice recommendations. A written personalized care plan for preventive services as well as general preventive health recommendations were provided to patient.  See attached scanned questionnaire for additional information.   Signed,   Lindell Noe, MHA, BS, LPN Health Coach

## 2018-04-24 NOTE — Patient Instructions (Signed)
Kim Brown , Thank you for taking time to come for your Medicare Wellness Visit. I appreciate your ongoing commitment to your health goals. Please review the following plan we discussed and let me know if I can assist you in the future.   These are the goals we discussed: Goals    . Increase physical activity     Starting 04/24/2018, I will continue to exercise for 20-30 minutes 5 days per week.        This is a list of the screening recommended for you and due dates:  Health Maintenance  Topic Date Due  . Tetanus Vaccine  04/25/2019*  . Flu Shot  07/18/2018  . Mammogram  11/28/2018  . Colon Cancer Screening  06/01/2027  . DEXA scan (bone density measurement)  Completed  .  Hepatitis C: One time screening is recommended by Center for Disease Control  (CDC) for  adults born from [birth year] through 1965.   Completed  . Pneumonia vaccines  Completed  *Topic was postponed. The date shown is not the original due date.   Preventive Care for Adults  A healthy lifestyle and preventive care can promote health and wellness. Preventive health guidelines for adults include the following key practices.  . A routine yearly physical is a good way to check with your health care provider about your health and preventive screening. It is a chance to share any concerns and updates on your health and to receive a thorough exam.  . Visit your dentist for a routine exam and preventive care every 6 months. Brush your teeth twice a day and floss once a day. Good oral hygiene prevents tooth decay and gum disease.  . The frequency of eye exams is based on your age, health, family medical history, use  of contact lenses, and other factors. Follow your health care provider's recommendations for frequency of eye exams.  . Eat a healthy diet. Foods like vegetables, fruits, whole grains, low-fat dairy products, and lean protein foods contain the nutrients you need without too many calories. Decrease your intake of foods  high in solid fats, added sugars, and salt. Eat the right amount of calories for you. Get information about a proper diet from your health care provider, if necessary.  . Regular physical exercise is one of the most important things you can do for your health. Most adults should get at least 150 minutes of moderate-intensity exercise (any activity that increases your heart rate and causes you to sweat) each week. In addition, most adults need muscle-strengthening exercises on 2 or more days a week.  Silver Sneakers may be a benefit available to you. To determine eligibility, you may visit the website: www.silversneakers.com or contact program at (312) 206-1692 Mon-Fri between 8AM-8PM.   . Maintain a healthy weight. The body mass index (BMI) is a screening tool to identify possible weight problems. It provides an estimate of body fat based on height and weight. Your health care provider can find your BMI and can help you achieve or maintain a healthy weight.   For adults 20 years and older: ? A BMI below 18.5 is considered underweight. ? A BMI of 18.5 to 24.9 is normal. ? A BMI of 25 to 29.9 is considered overweight. ? A BMI of 30 and above is considered obese.   . Maintain normal blood lipids and cholesterol levels by exercising and minimizing your intake of saturated fat. Eat a balanced diet with plenty of fruit and vegetables. Blood tests for lipids  and cholesterol should begin at age 68 and be repeated every 5 years. If your lipid or cholesterol levels are high, you are over 50, or you are at high risk for heart disease, you may need your cholesterol levels checked more frequently. Ongoing high lipid and cholesterol levels should be treated with medicines if diet and exercise are not working.  . If you smoke, find out from your health care provider how to quit. If you do not use tobacco, please do not start.  . If you choose to drink alcohol, please do not consume more than 2 drinks per day.  One drink is considered to be 12 ounces (355 mL) of beer, 5 ounces (148 mL) of wine, or 1.5 ounces (44 mL) of liquor.  . If you are 60-25 years old, ask your health care provider if you should take aspirin to prevent strokes.  . Use sunscreen. Apply sunscreen liberally and repeatedly throughout the day. You should seek shade when your shadow is shorter than you. Protect yourself by wearing long sleeves, pants, a wide-brimmed hat, and sunglasses year round, whenever you are outdoors.  . Once a month, do a whole body skin exam, using a mirror to look at the skin on your back. Tell your health care provider of new moles, moles that have irregular borders, moles that are larger than a pencil eraser, or moles that have changed in shape or color.

## 2018-04-30 ENCOUNTER — Encounter: Payer: Self-pay | Admitting: Family Medicine

## 2018-04-30 ENCOUNTER — Ambulatory Visit (INDEPENDENT_AMBULATORY_CARE_PROVIDER_SITE_OTHER): Payer: PPO | Admitting: Family Medicine

## 2018-04-30 VITALS — BP 128/70 | HR 79 | Temp 98.6°F | Ht 63.5 in | Wt 173.5 lb

## 2018-04-30 DIAGNOSIS — L500 Allergic urticaria: Secondary | ICD-10-CM

## 2018-04-30 DIAGNOSIS — E7800 Pure hypercholesterolemia, unspecified: Secondary | ICD-10-CM

## 2018-04-30 DIAGNOSIS — Z7189 Other specified counseling: Secondary | ICD-10-CM

## 2018-04-30 DIAGNOSIS — Z0000 Encounter for general adult medical examination without abnormal findings: Secondary | ICD-10-CM

## 2018-04-30 MED ORDER — PRAVASTATIN SODIUM 10 MG PO TABS: 10.0000 mg | ORAL_TABLET | Freq: Every day | ORAL | 3 refills | Status: DC

## 2018-04-30 MED ORDER — TRIAMCINOLONE ACETONIDE 0.1 % EX CREA: 1.0000 "application " | TOPICAL_CREAM | CUTANEOUS | 1 refills | Status: DC | PRN

## 2018-04-30 NOTE — Progress Notes (Signed)
Tetanus can be done at pharmacy.  Declined by patient at [facility].  Flu shot encouraged.  She has no medical reason not to get vaccinated.   Pap not due.  DXA 2018 Colonoscopy 2018 Mammogram due.  D/w pt.  She'll call about f/u.   Has a living will. Husband is designated if patient were incapacitated.   She has used TAC prn for rash.  She had allergy testing done.  She needed a refill.  rx sent.    Labs d/w pt.  Statin intolerance with lovastatin d/w pt.  7.6% ASCVD score.  Offered pravastatin and rationale d/w pt in detail.    She is putting up with foot pain. If she needs a referral then we can do that later on.  I'll await update from patient.    Meds, vitals, and allergies reviewed.   ROS: Per HPI unless specifically indicated in ROS section   GEN: nad, alert and oriented HEENT: mucous membranes moist NECK: supple w/o LA CV: rrr.  no murmur PULM: ctab, no inc wob ABD: soft, +bs EXT: no edema SKIN: no acute rash

## 2018-04-30 NOTE — Patient Instructions (Addendum)
Check with your insurance to see if they will cover the shingrix shot. Get a flu shot in the fall.  Try pravastatin in the meantime.  Update me as needed.   Use TAC cream as needed.

## 2018-05-01 DIAGNOSIS — Z0000 Encounter for general adult medical examination without abnormal findings: Secondary | ICD-10-CM | POA: Insufficient documentation

## 2018-05-01 NOTE — Assessment & Plan Note (Signed)
Labs d/w pt.  Statin intolerance with lovastatin d/w pt.  7.6% ASCVD score.  Offered pravastatin and rationale d/w pt in detail.  She can try it.  If she has aches she should stop it.  If she can tolerate it then she may benefit from medication use.  Continue work on diet and exercise. >25 minutes spent in face to face time with patient, >50% spent in counselling or coordination of care, discussing lipids in detail, discussing previous allergy evaluation, etc.

## 2018-05-01 NOTE — Assessment & Plan Note (Signed)
Has a living will. Husband is designated if patient were incapacitated.

## 2018-05-01 NOTE — Assessment & Plan Note (Signed)
Tetanus can be done at pharmacy.  Declined by patient at [facility].  Flu shot encouraged.  She has no medical reason not to get vaccinated.   Pap not due.  DXA 2018 Colonoscopy 2018 Mammogram due.  D/w pt.  She'll call about f/u.   Has a living will. Husband is designated if patient were incapacitated.

## 2018-05-01 NOTE — Assessment & Plan Note (Signed)
Can use triamcinolone as needed.  Prescription sent.

## 2018-07-26 DIAGNOSIS — Z1231 Encounter for screening mammogram for malignant neoplasm of breast: Secondary | ICD-10-CM | POA: Diagnosis not present

## 2018-07-26 LAB — HM MAMMOGRAPHY

## 2018-08-01 ENCOUNTER — Encounter: Payer: Self-pay | Admitting: Family Medicine

## 2018-08-14 DIAGNOSIS — D225 Melanocytic nevi of trunk: Secondary | ICD-10-CM | POA: Diagnosis not present

## 2018-08-14 DIAGNOSIS — L258 Unspecified contact dermatitis due to other agents: Secondary | ICD-10-CM | POA: Diagnosis not present

## 2018-08-14 DIAGNOSIS — L821 Other seborrheic keratosis: Secondary | ICD-10-CM | POA: Diagnosis not present

## 2018-10-09 DIAGNOSIS — H04123 Dry eye syndrome of bilateral lacrimal glands: Secondary | ICD-10-CM | POA: Diagnosis not present

## 2018-10-09 DIAGNOSIS — H1131 Conjunctival hemorrhage, right eye: Secondary | ICD-10-CM | POA: Diagnosis not present

## 2018-10-30 DIAGNOSIS — H2513 Age-related nuclear cataract, bilateral: Secondary | ICD-10-CM | POA: Diagnosis not present

## 2018-10-30 DIAGNOSIS — H02889 Meibomian gland dysfunction of unspecified eye, unspecified eyelid: Secondary | ICD-10-CM | POA: Diagnosis not present

## 2018-10-30 DIAGNOSIS — H5203 Hypermetropia, bilateral: Secondary | ICD-10-CM | POA: Diagnosis not present

## 2018-12-24 DIAGNOSIS — H02889 Meibomian gland dysfunction of unspecified eye, unspecified eyelid: Secondary | ICD-10-CM | POA: Diagnosis not present

## 2018-12-24 DIAGNOSIS — H04123 Dry eye syndrome of bilateral lacrimal glands: Secondary | ICD-10-CM | POA: Diagnosis not present

## 2019-04-21 ENCOUNTER — Encounter: Payer: Self-pay | Admitting: Family Medicine

## 2019-04-28 ENCOUNTER — Ambulatory Visit: Payer: PPO

## 2019-05-05 ENCOUNTER — Encounter: Payer: PPO | Admitting: Family Medicine

## 2019-09-05 ENCOUNTER — Other Ambulatory Visit: Payer: Self-pay | Admitting: Family Medicine

## 2019-09-05 ENCOUNTER — Ambulatory Visit (INDEPENDENT_AMBULATORY_CARE_PROVIDER_SITE_OTHER): Payer: PPO

## 2019-09-05 ENCOUNTER — Other Ambulatory Visit (INDEPENDENT_AMBULATORY_CARE_PROVIDER_SITE_OTHER): Payer: PPO

## 2019-09-05 ENCOUNTER — Ambulatory Visit: Payer: PPO

## 2019-09-05 DIAGNOSIS — Z0000 Encounter for general adult medical examination without abnormal findings: Secondary | ICD-10-CM | POA: Diagnosis not present

## 2019-09-05 DIAGNOSIS — E7800 Pure hypercholesterolemia, unspecified: Secondary | ICD-10-CM

## 2019-09-05 LAB — COMPREHENSIVE METABOLIC PANEL
ALT: 14 U/L (ref 0–35)
AST: 15 U/L (ref 0–37)
Albumin: 4.3 g/dL (ref 3.5–5.2)
Alkaline Phosphatase: 90 U/L (ref 39–117)
BUN: 12 mg/dL (ref 6–23)
CO2: 30 mEq/L (ref 19–32)
Calcium: 9.4 mg/dL (ref 8.4–10.5)
Chloride: 102 mEq/L (ref 96–112)
Creatinine, Ser: 0.89 mg/dL (ref 0.40–1.20)
GFR: 63.05 mL/min (ref >60.00)
Glucose, Bld: 107 mg/dL — ABNORMAL HIGH (ref 70–99)
Potassium: 4.2 mEq/L (ref 3.5–5.1)
Sodium: 139 mEq/L (ref 135–145)
Total Bilirubin: 0.5 mg/dL (ref 0.2–1.2)
Total Protein: 6.6 g/dL (ref 6.0–8.3)

## 2019-09-05 LAB — CBC WITH DIFFERENTIAL/PLATELET
Basophils Absolute: 0.1 10*3/uL (ref 0.0–0.1)
Basophils Relative: 0.8 % (ref 0.0–3.0)
Eosinophils Absolute: 0.2 10*3/uL (ref 0.0–0.7)
Eosinophils Relative: 2.7 % (ref 0.0–5.0)
HCT: 42.7 % (ref 36.0–46.0)
Hemoglobin: 14.2 g/dL (ref 12.0–15.0)
Lymphocytes Relative: 24.7 % (ref 12.0–46.0)
Lymphs Abs: 2.1 10*3/uL (ref 0.7–4.0)
MCHC: 33.3 g/dL (ref 30.0–36.0)
MCV: 91.3 fl (ref 78.0–100.0)
Monocytes Absolute: 0.5 10*3/uL (ref 0.1–1.0)
Monocytes Relative: 5.9 % (ref 3.0–12.0)
Neutro Abs: 5.5 10*3/uL (ref 1.4–7.7)
Neutrophils Relative %: 65.9 % (ref 43.0–77.0)
Platelets: 271 10*3/uL (ref 150.0–400.0)
RBC: 4.67 Mil/uL (ref 3.87–5.11)
RDW: 14.6 % (ref 11.5–15.5)
WBC: 8.4 10*3/uL (ref 4.0–10.5)

## 2019-09-05 LAB — LIPID PANEL
Cholesterol: 250 mg/dL — ABNORMAL HIGH (ref 0–200)
HDL: 36.4 mg/dL — ABNORMAL LOW (ref >39.00)
NonHDL: 213.59
Total CHOL/HDL Ratio: 7
Triglycerides: 251 mg/dL — ABNORMAL HIGH (ref 0.0–149.0)
VLDL: 50.2 mg/dL — ABNORMAL HIGH (ref 0.0–40.0)

## 2019-09-05 LAB — TSH: TSH: 0.95 u[IU]/mL (ref 0.35–4.50)

## 2019-09-05 LAB — LDL CHOLESTEROL, DIRECT: Direct LDL: 184 mg/dL

## 2019-09-05 NOTE — Progress Notes (Signed)
PCP notes: none  Health Maintenance: Patient will discuss flu vaccine at physical, will check with health department about receiving the Tdap vaccine, and declined Shingrix    Abnormal Screenings: none    Patient concerns: Patient wants to discuss being referred to a specialist concerning her ongoing shoulder pain.     Nurse concerns: none    Next PCP appt.: 09/15/2019 @ 11:30 am

## 2019-09-05 NOTE — Patient Instructions (Addendum)
[name] , Thank you for taking time to come for your Medicare Wellness Visit. I appreciate your ongoing commitment to your health goals. Please review the following plan we discussed and let me know if I can assist you in the future.   Screening recommendations/referrals: Colonoscopy: up to date, completed 05/31/17 Mammogram: up to date, completed 07/26/2018 Bone Density: up to date, completed 04/10/2017 Recommended yearly ophthalmology/optometry visit for glaucoma screening and checkup Recommended yearly dental visit for hygiene and checkup  Vaccinations: Influenza vaccine: will discuss at physical  Pneumococcal vaccine: series completed Tdap vaccine: will get at a later date at the health department  Shingles vaccine: declined    Advanced directives: Advance directive discussed with you today. Even though you declined this today please call our office should you change your mind and we can give you the proper paperwork for you to fill out.   Conditions/risks identified: hypercholesterolemia  Next appointment: 09/15/2019 @ 11:30 am   Preventive Care 65 Years and Older, Female Preventive care refers to lifestyle choices and visits with your health care provider that can promote health and wellness. What does preventive care include?  A yearly physical exam. This is also called an annual well check.  Dental exams once or twice a year.  Routine eye exams. Ask your health care provider how often you should have your eyes checked.  Personal lifestyle choices, including:  Daily care of your teeth and gums.  Regular physical activity.  Eating a healthy diet.  Avoiding tobacco and drug use.  Limiting alcohol use.  Practicing safe sex.  Taking low-dose aspirin every day.  Taking vitamin and mineral supplements as recommended by your health care provider. What happens during an annual well check? The services and screenings done by your health care provider during your annual  well check will depend on your age, overall health, lifestyle risk factors, and family history of disease. Counseling  Your health care provider may ask you questions about your:  Alcohol use.  Tobacco use.  Drug use.  Emotional well-being.  Home and relationship well-being.  Sexual activity.  Eating habits.  History of falls.  Memory and ability to understand (cognition).  Work and work Statistician.  Reproductive health. Screening  You may have the following tests or measurements:  Height, weight, and BMI.  Blood pressure.  Lipid and cholesterol levels. These may be checked every 5 years, or more frequently if you are over 87 years old.  Skin check.  Lung cancer screening. You may have this screening every year starting at age 89 if you have a 30-pack-year history of smoking and currently smoke or have quit within the past 15 years.  Fecal occult blood test (FOBT) of the stool. You may have this test every year starting at age 8.  Flexible sigmoidoscopy or colonoscopy. You may have a sigmoidoscopy every 5 years or a colonoscopy every 10 years starting at age 46.  Hepatitis C blood test.  Hepatitis B blood test.  Sexually transmitted disease (STD) testing.  Diabetes screening. This is done by checking your blood sugar (glucose) after you have not eaten for a while (fasting). You may have this done every 1-3 years.  Bone density scan. This is done to screen for osteoporosis. You may have this done starting at age 45.  Mammogram. This may be done every 1-2 years. Talk to your health care provider about how often you should have regular mammograms. Talk with your health care provider about your test results, treatment  options, and if necessary, the need for more tests. Vaccines  Your health care provider may recommend certain vaccines, such as:  Influenza vaccine. This is recommended every year.  Tetanus, diphtheria, and acellular pertussis (Tdap, Td) vaccine.  You may need a Td booster every 10 years.  Zoster vaccine. You may need this after age 57.  Pneumococcal 13-valent conjugate (PCV13) vaccine. One dose is recommended after age 17.  Pneumococcal polysaccharide (PPSV23) vaccine. One dose is recommended after age 47. Talk to your health care provider about which screenings and vaccines you need and how often you need them. This information is not intended to replace advice given to you by your health care provider. Make sure you discuss any questions you have with your health care provider. Document Released: 12/31/2015 Document Revised: 08/23/2016 Document Reviewed: 10/05/2015 Elsevier Interactive Patient Education  2017 [place] Prevention in the Home Falls can cause injuries. They can happen to people of all ages. There are many things you can do to make your home safe and to help prevent falls. What can I do on the outside of my home?  Regularly fix the edges of walkways and driveways and fix any cracks.  Remove anything that might make you trip as you walk through a door, such as a raised step or threshold.  Trim any bushes or trees on the path to your home.  Use bright outdoor lighting.  Clear any walking paths of anything that might make someone trip, such as rocks or tools.  Regularly check to see if handrails are loose or broken. Make sure that both sides of any steps have handrails.  Any raised decks and porches should have guardrails on the edges.  Have any leaves, snow, or ice cleared regularly.  Use sand or salt on walking paths during winter.  Clean up any spills in your garage right away. This includes oil or grease spills. What can I do in the bathroom?  Use night lights.  Install grab bars by the toilet and in the tub and shower. Do not use towel bars as grab bars.  Use non-skid mats or decals in the tub or shower.  If you need to sit down in the shower, use a plastic, non-slip stool.  Keep the  floor dry. Clean up any water that spills on the floor as soon as it happens.  Remove soap buildup in the tub or shower regularly.  Attach bath mats securely with double-sided non-slip rug tape.  Do not have throw rugs and other things on the floor that can make you trip. What can I do in the bedroom?  Use night lights.  Make sure that you have a light by your bed that is easy to reach.  Do not use any sheets or blankets that are too big for your bed. They should not hang down onto the floor.  Have a firm chair that has side arms. You can use this for support while you get dressed.  Do not have throw rugs and other things on the floor that can make you trip. What can I do in the kitchen?  Clean up any spills right away.  Avoid walking on wet floors.  Keep items that you use a lot in easy-to-reach places.  If you need to reach something above you, use a strong step stool that has a grab bar.  Keep electrical cords out of the way.  Do not use floor polish or wax that makes floors slippery.  If you must use wax, use non-skid floor wax.  Do not have throw rugs and other things on the floor that can make you trip. What can I do with my stairs?  Do not leave any items on the stairs.  Make sure that there are handrails on both sides of the stairs and use them. Fix handrails that are broken or loose. Make sure that handrails are as long as the stairways.  Check any carpeting to make sure that it is firmly attached to the stairs. Fix any carpet that is loose or worn.  Avoid having throw rugs at the top or bottom of the stairs. If you do have throw rugs, attach them to the floor with carpet tape.  Make sure that you have a light switch at the top of the stairs and the bottom of the stairs. If you do not have them, ask someone to add them for you. What else can I do to help prevent falls?  Wear shoes that:  Do not have high heels.  Have rubber bottoms.  Are comfortable and fit  you well.  Are closed at the toe. Do not wear sandals.  If you use a stepladder:  Make sure that it is fully opened. Do not climb a closed stepladder.  Make sure that both sides of the stepladder are locked into place.  Ask someone to hold it for you, if possible.  Clearly mark and make sure that you can see:  Any grab bars or handrails.  First and last steps.  Where the edge of each step is.  Use tools that help you move around (mobility aids) if they are needed. These include:  Canes.  Walkers.  Scooters.  Crutches.  Turn on the lights when you go into a dark area. Replace any light bulbs as soon as they burn out.  Set up your furniture so you have a clear path. Avoid moving your furniture around.  If any of your floors are uneven, fix them.  If there are any pets around you, be aware of where they are.  Review your medicines with your doctor. Some medicines can make you feel dizzy. This can increase your chance of falling. Ask your doctor what other things that you can do to help prevent falls. This information is not intended to replace advice given to you by your health care provider. Make sure you discuss any questions you have with your health care provider. Document Released: 09/30/2009 Document Revised: 05/11/2016 Document Reviewed: 01/08/2015 Elsevier Interactive Patient Education  2017 [employer].

## 2019-09-05 NOTE — Progress Notes (Signed)
Subjective:   Kim Brown is a 68 y.o. female who presents for Medicare Annual (Subsequent) preventive examination.  Review of Systems:    This visit is being conducted through telemedicine due to the COVID-19 pandemic. This patient has given me verbal consent via doximity to conduct this visit, patient states they are participating from their home address. Some vital signs may be absent or patient reported.    Patient identification: identified by name, DOB, and current address  Cardiac Risk Factors include: advanced age (>59men, >46 women);dyslipidemia     Objective:     Vitals: There were no vitals taken for this visit.  There is no height or weight on file to calculate BMI.  Advanced Directives 09/05/2019 04/24/2018 05/17/2017  Does Patient Have a Medical Advance Directive? No Yes Yes  Type of Advance Directive - [place];Living will Kim Brown;Living will  Copy of Kim Brown in Chart? - No - copy requested -  Would patient like information on creating a medical advance directive? No - Patient declined - -    Tobacco Social History   Tobacco Use  Smoking Status Never Smoker  Smokeless Tobacco Never Used     Counseling given: Not Answered   Clinical Intake:  Pre-visit preparation completed: Yes  Pain : No/denies pain     Nutritional Risks: None Diabetes: No  How often do you need to have someone help you when you read instructions, pamphlets, or other written materials from your doctor or pharmacy?: 1 - Never What is the last grade level you completed in school?: Associate  Interpreter Needed?: No  Information entered by :: CJohnson, LPN  Past Medical History:  Diagnosis Date   Allergy    Anemia    Arthritis    feet and hands    Exogenous obesity    Heel spur    Hyperlipidemia    Hypertension    Hypothyroid    Irregular heart beats    Palpitations    Prolapsed, anus    uses mag  oxide powder nightly    RSD (reflex sympathetic dystrophy)    after surgery on R foot   Stress incontinence, female    Past Surgical History:  Procedure Laterality Date   ABDOMINAL HYSTERECTOMY  2005   BUNIONECTOMY     COLONOSCOPY     DILATION AND CURETTAGE OF UTERUS     Family History  Problem Relation Age of Onset   Arthritis Mother    Hypertension Mother    Colon polyps Mother    Heart disease Father    Hypertension Father    Arthritis Father    Stroke Father    Heart failure Brother    Diabetes Brother    Pancreatic cancer Brother    Colon cancer Neg Hx    Breast cancer Neg Hx    Esophageal cancer Neg Hx    Rectal cancer Neg Hx    Stomach cancer Neg Hx    Social History   Socioeconomic History   Marital status: Married    Spouse name: Not on file   Number of children: Not on file   Years of education: Not on file   Highest education level: Not on file  Occupational History   Not on file  Social Needs   Financial resource strain: Not hard at all   Food insecurity    Worry: Never true    Inability: Never true   Transportation needs    Medical:  No    Non-medical: No  Tobacco Use   Smoking status: Never Smoker   Smokeless tobacco: Never Used  Substance and Sexual Activity   Alcohol use: No   Drug use: No   Sexual activity: Yes  Lifestyle   Physical activity    Days per week: 3 days    Minutes per session: 30 min   Stress: Not at all  Relationships   Social connections    Talks on phone: Not on file    Gets together: Not on file    Attends religious service: Not on file    Active member of club or organization: Not on file    Attends meetings of clubs or organizations: Not on file    Relationship status: Not on file  Other Topics Concern   Not on file  Social History Narrative   Married 1972   2 daughters (Kim Brown is one) and 1 son   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   azelastine (ASTELIN) 0.1 % nasal spray Place 2 sprays into both nostrils 2 (two) times daily.   b complex vitamins tablet Take 1 tablet by mouth daily.     Calcium Carbonate-Vitamin D (CALTRATE 600+D PO) Take by mouth daily.     cetirizine (ZYRTEC ALLERGY) 10 MG tablet Take 10 mg by mouth daily.   Cholecalciferol (VITAMIN D) 2000 UNITS CAPS Take 1 capsule by mouth 2 (two) times daily.     co-enzyme Q-10 30 MG capsule Take 30 mg by mouth as needed.     DHEA 25 MG CAPS Take by mouth daily.     fish oil-omega-3 fatty acids 1000 MG capsule Take 1 g by mouth daily.   Magnesium 250 MG TABS Take by mouth daily.     Melatonin 3 MG TABS Take 10 mg by mouth at bedtime.    Multiple Vitamins-Minerals (CENTRUM SILVER ULTRA WOMENS) TABS Take by mouth daily.     NONFORMULARY OR COMPOUNDED ITEM Estrogen compound    pravastatin (PRAVACHOL) 10 MG tablet Take 1 tablet (10 mg total) by mouth daily.   Pregnenolone POWD 100 mg by Does not apply route daily.     progesterone (PROMETRIUM) 200 MG capsule Take 200 mg by mouth daily.     thyroid (ARMOUR) 60 MG tablet 90mg  in AM and 60mg  in PM   triamcinolone cream (KENALOG) 0.1 % Apply 1 application topically as needed.   vitamin A 8000 UNIT capsule Take 8,000 Units by mouth daily.     Facility-Administered Encounter Medications as of 09/05/2019  Medication   0.9 %  sodium chloride infusion    Activities of Daily Living In your present state of health, do you have any difficulty performing the following activities: 09/05/2019  Hearing? N  Vision? N  Difficulty concentrating or making decisions? N  Walking or climbing stairs? N  Dressing or bathing? N  Doing errands, shopping? N  Preparing Food and eating ? N  Using the Toilet? N  In the past six months, have you accidently leaked urine? N  Do you have  problems with loss of bowel control? N  Managing your Medications? N  Managing your Finances? N  Housekeeping or managing your Housekeeping? N  Some recent data might be hidden    Patient Care Team: Tonia Ghent, MD as  PCP - General (Family Medicine)    Assessment:   This is a routine wellness examination for Kim Brown.  Exercise Activities and Dietary recommendations Current Exercise Habits: Home exercise routine, Type of exercise: walking, Time (Minutes): 30, Frequency (Times/Week): 3, Weekly Exercise (Minutes/Week): 90, Intensity: Mild, Exercise limited by: None identified  Goals     Increase physical activity     Starting 04/24/2018, I will continue to exercise for 20-30 minutes 5 days per week.      Patient Stated     09/05/2019, Patient wants to maintain her current status and not change anything right now       Fall Risk Fall Risk  09/05/2019 04/24/2018  Falls in the past year? 0 No  Number falls in past yr: 0 -  Follow up Falls evaluation completed;Falls prevention discussed -   Is the patient's home free of loose throw rugs in walkways, pet beds, electrical cords, etc?   yes      Grab bars in the bathroom? yes      Handrails on the stairs?   yes      Adequate lighting?   yes  Timed Get Up and Go performed: n/a  Depression Screen PHQ 2/9 Scores 09/05/2019 04/24/2018  PHQ - 2 Score 0 0  PHQ- 9 Score 0 0     Cognitive Function MMSE - Mini Mental State Exam 09/05/2019 04/24/2018  Orientation to time 5 5  Orientation to Place 5 5  Registration 3 3  Attention/ Calculation 5 0  Recall 3 3  Language- name 2 objects - 0  Language- repeat 1 1  Language- follow 3 step command - 3  Language- read & follow direction - 0  Write a sentence - 0  Copy design - 0  Total score - 20  Mini Cog  Mini-Cog screen was completed. Maximum score is 22. A value of 0 denotes this part of the MMSE was not completed or the patient failed this part of the Mini-Cog screening.       Immunization History  Administered Date(s) Administered   Influenza Split 11/10/2011   Influenza, Seasonal, Injecte, Preservative Fre 11/18/2012   Pneumococcal Conjugate-13 03/20/2017   Pneumococcal Polysaccharide-23 04/24/2018   Tdap 12/18/2005    Qualifies for Shingles Vaccine?yes  Screening Tests Health Maintenance  Topic Date Due   TETANUS/TDAP  12/19/2015   INFLUENZA VACCINE  07/19/2019   MAMMOGRAM  07/26/2020   COLONOSCOPY  06/01/2027   DEXA SCAN  Completed   Hepatitis C Screening  Completed   PNA vac Low Risk Adult  Completed    Cancer Screenings: Lung: Low Dose CT Chest recommended if Age 46-80 years, 30 pack-year currently smoking OR have quit w/in 15years. Patient does not qualify. Breast:  Up to date on Mammogram? Yes, completed 07/26/2018 Up to date of Bone Density/Dexa? Yes, completed 04/10/2017 Colorectal: completed 05/31/2017  Additional Screenings: : Hepatitis C Screening: 12/18/2006     Plan:    Patient wants to maintain her current status and not change anything right now.    I have personally reviewed and noted the following in the patients chart:    Medical and social history  Use of alcohol, tobacco or illicit drugs   Current medications and supplements  Functional ability and status  Nutritional status  Physical activity  Advanced directives  List of other physicians  Hospitalizations, surgeries, and ER visits in previous 12 months  Vitals  Screenings to include cognitive, depression, and falls  Referrals and appointments  In addition, I have reviewed and discussed with patient certain preventive protocols, quality metrics, and best practice recommendations. A written personalized care plan for preventive services as well as general preventive health recommendations were provided to patient.     Andrez Grime, LPN  [id number]

## 2019-09-15 ENCOUNTER — Other Ambulatory Visit: Payer: Self-pay

## 2019-09-15 ENCOUNTER — Ambulatory Visit (INDEPENDENT_AMBULATORY_CARE_PROVIDER_SITE_OTHER): Payer: PPO | Admitting: Family Medicine

## 2019-09-15 ENCOUNTER — Encounter: Payer: Self-pay | Admitting: Family Medicine

## 2019-09-15 VITALS — BP 122/78 | HR 88 | Temp 97.9°F | Ht 63.5 in | Wt 174.4 lb

## 2019-09-15 DIAGNOSIS — Z0000 Encounter for general adult medical examination without abnormal findings: Secondary | ICD-10-CM

## 2019-09-15 DIAGNOSIS — R300 Dysuria: Secondary | ICD-10-CM | POA: Diagnosis not present

## 2019-09-15 DIAGNOSIS — M25519 Pain in unspecified shoulder: Secondary | ICD-10-CM | POA: Diagnosis not present

## 2019-09-15 DIAGNOSIS — Z23 Encounter for immunization: Secondary | ICD-10-CM | POA: Diagnosis not present

## 2019-09-15 DIAGNOSIS — E7800 Pure hypercholesterolemia, unspecified: Secondary | ICD-10-CM

## 2019-09-15 DIAGNOSIS — Z7189 Other specified counseling: Secondary | ICD-10-CM

## 2019-09-15 LAB — POC URINALSYSI DIPSTICK (AUTOMATED)
Bilirubin, UA: NEGATIVE
Blood, UA: NEGATIVE
Glucose, UA: NEGATIVE
Ketones, UA: NEGATIVE
Leukocytes, UA: NEGATIVE
Nitrite, UA: NEGATIVE
Protein, UA: NEGATIVE
Spec Grav, UA: 1.01 (ref 1.010–1.025)
Urobilinogen, UA: 0.2 E.U./dL
pH, UA: 7.5 (ref 5.0–8.0)

## 2019-09-15 NOTE — Patient Instructions (Addendum)
Check with your insurance to see if they will cover the shingrix and tetanus shots.   Ask the front about seeing Dr. Lorelei Pont about inserts.   Use the shoulder exercises and a thumb spica brace.  See if those help.  You may need to talk to [facility] about those.  Take care.  Glad to see you.

## 2019-09-15 NOTE — Progress Notes (Signed)
R shoulder pain, chronic, worse recently.  R handed.  Pain with lifting, esp with poor leverage and overhead ROM.    She has B thumb pain.  She still has R foot pain.  She had seen podiatry prev about her foot pain, was asking about inserts and prev RSD pain.     HRT/thyroid per outside clinic. I'll defer.  She agrees.    HLD but statin intolerant.  D/w pt about labs diet and exercise.  She had some occ lower abd pain, clearly better now.  She has darker urine in the early AM but it is lighter later in the day.  D/w pt about adequate fluid intake.    Tetanus can be done at pharmacy. Shingles d/w pt.   Flu shot 2020.   Pap not due.  DXA 2018 Colonoscopy 2018 Mammogram 2019 Has a living will. Husband is designated if patient were incapacitated. Then daughter [place] designated if husband is incapacitated.   Diet and exercise d/w pt.  Limited by joint pain.    PMH and SH reviewed ROS: Per HPI unless specifically indicated in ROS section  Meds, vitals, and allergies reviewed.   GEN: nad, alert and oriented HEENT: ncat NECK: supple w/o LA CV: rrr PULM: ctab, no inc wob ABD: soft, +bs EXT: no edema SKIN: no acute rash Right foot with first toe lateral deviation noted, chronic finding. Right shoulder with no arm drop but pain on internal rotation.  No pain on external rotation.  Decreased pain on internal rotation with scapular manipulation.  AC joint nontender.  Distally neurovascular intact.

## 2019-09-18 DIAGNOSIS — M25519 Pain in unspecified shoulder: Secondary | ICD-10-CM | POA: Insufficient documentation

## 2019-09-18 DIAGNOSIS — R300 Dysuria: Secondary | ICD-10-CM | POA: Insufficient documentation

## 2019-09-18 NOTE — Assessment & Plan Note (Signed)
She clearly had decrease in pain on internal rotation with scapular manipulation.  Home exercise program discussed with patient, exercise demonstrated and handout given to patient. She will also ask the front about seeing Dr. Lorelei Pont about inserts given her foot pain. Her thumb pain is a separate issue when she can see about getting a thumb spica brace.  If her shoulder pain and thumb pain are not better, she may need input from Dr. Lorelei Pont.  >25 minutes spent in face to face time with patient, >50% spent in counselling or coordination of care.

## 2019-09-18 NOTE — Assessment & Plan Note (Signed)
Tetanus can be done at pharmacy. Shingles d/w pt.   Flu shot 2020.   Pap not due.  DXA 2018 Colonoscopy 2018 Mammogram 2019 Has a living will. Husband is designated if patient were incapacitated. Then daughter [place] designated if husband is incapacitated.   Diet and exercise d/w pt.  Limited by joint pain.

## 2019-09-18 NOTE — Assessment & Plan Note (Signed)
Discussed patient about adequate fluid intake.  Benign abdominal exam.  Clearly better in the meantime.  Urinalysis unremarkable.

## 2019-09-18 NOTE — Assessment & Plan Note (Signed)
HLD but statin intolerant.  D/w pt about labs diet and exercise.

## 2019-09-18 NOTE — Assessment & Plan Note (Signed)
Has a living will.  Husband is designated if patient were incapacitated. Then daughter Brittany designated if husband is incapacitated.   

## 2020-02-06 DIAGNOSIS — Z1231 Encounter for screening mammogram for malignant neoplasm of breast: Secondary | ICD-10-CM | POA: Diagnosis not present

## 2020-02-06 LAB — HM MAMMOGRAPHY

## 2020-02-11 ENCOUNTER — Encounter: Payer: Self-pay | Admitting: Family Medicine

## 2020-03-26 DIAGNOSIS — M9904 Segmental and somatic dysfunction of sacral region: Secondary | ICD-10-CM | POA: Diagnosis not present

## 2020-03-26 DIAGNOSIS — M9903 Segmental and somatic dysfunction of lumbar region: Secondary | ICD-10-CM | POA: Diagnosis not present

## 2020-03-26 DIAGNOSIS — M9905 Segmental and somatic dysfunction of pelvic region: Secondary | ICD-10-CM | POA: Diagnosis not present

## 2020-03-26 DIAGNOSIS — M9902 Segmental and somatic dysfunction of thoracic region: Secondary | ICD-10-CM | POA: Diagnosis not present

## 2020-04-12 ENCOUNTER — Encounter: Payer: Self-pay | Admitting: Family Medicine

## 2020-04-12 ENCOUNTER — Ambulatory Visit (INDEPENDENT_AMBULATORY_CARE_PROVIDER_SITE_OTHER): Payer: PPO | Admitting: Family Medicine

## 2020-04-12 ENCOUNTER — Ambulatory Visit: Payer: PPO | Admitting: Family Medicine

## 2020-04-12 ENCOUNTER — Other Ambulatory Visit: Payer: Self-pay

## 2020-04-12 ENCOUNTER — Ambulatory Visit (INDEPENDENT_AMBULATORY_CARE_PROVIDER_SITE_OTHER)
Admission: RE | Admit: 2020-04-12 | Discharge: 2020-04-12 | Disposition: A | Discharge status: Home / Self Care | Payer: PPO | Source: Ambulatory Visit | Attending: Family Medicine | Admitting: Family Medicine

## 2020-04-12 VITALS — BP 120/78 | HR 89 | Temp 98.7°F | Ht 63.5 in | Wt 175.5 lb

## 2020-04-12 DIAGNOSIS — M7581 Other shoulder lesions, right shoulder: Secondary | ICD-10-CM

## 2020-04-12 DIAGNOSIS — G8929 Other chronic pain: Secondary | ICD-10-CM

## 2020-04-12 DIAGNOSIS — M19011 Primary osteoarthritis, right shoulder: Secondary | ICD-10-CM | POA: Diagnosis not present

## 2020-04-12 DIAGNOSIS — M25511 Pain in right shoulder: Secondary | ICD-10-CM | POA: Diagnosis not present

## 2020-04-12 MED ORDER — METHYLPREDNISOLONE ACETATE 40 MG/ML IJ SUSP
80.0000 mg | Freq: Once | INTRAMUSCULAR | Status: AC
  Administered 2020-04-12: 11:00:00 80 mg via INTRA_ARTICULAR

## 2020-04-12 NOTE — Patient Instructions (Signed)
Over the counter: Voltaren (Generic Diclofenac) 1% gel.  You can use this up to 4 times a day on any joint or muscle injury * This costs about 9 dollars for 1 tube.

## 2020-04-12 NOTE — Progress Notes (Signed)
Kim Sallie T. Karleigh Bunte, MD, [place] at [facility] [place], [address]  Phone: [phone]  FAX: [phone]  Kim Brown - 69 y.o. female  MRN [id number]  Date of Birth: 06-17-1951  Date: 04/12/2020  PCP: Tonia Ghent, MD  Referral: Tonia Ghent, MD  Chief Complaint  Patient presents with  . Shoulder Pain    Bilateral    This visit occurred during the SARS-CoV-2 public health emergency.  Safety protocols were in place, including screening questions prior to the visit, additional usage of staff PPE, and extensive cleaning of exam room while observing appropriate contact time as indicated for disinfecting solutions.   Subjective:   Kim Brown is a 69 y.o. very pleasant female patient with Body mass index is 30.6 kg/m. who presents with the following:  R shoulder pain, saw Dr. Keturah Barre 9 months ago.  She has been having shoulder pain right worse than left for at least 2 years and probably longer.  Over the last 6 months or so this is gotten worse and she has been helping to take care of her grandson.  She has pain predominantly with abduction and internal range of motion.  She has quite a bit of clicking, grinding, popping with basic use.  She also feels like her strength is decreased at the shoulder as well.  Currently she does have a little bit of some mild swelling in the caudal aspect of the shoulder.  2 years.  Started to keep her grandson.    EROM is slightly weak  Hawkins + neer + Loss of motion, >>> R all directions  inj r shoulder Gh OA  Review of Systems is noted in the HPI, as appropriate   Objective:   BP 120/78   Pulse 89   Temp 98.7 F (37.1 C) (Temporal)   Ht 5' 3.5" (1.613 m)   Wt 175 lb 8 oz (79.6 kg)   SpO2 97%   BMI 30.60 kg/m    GEN: No acute distress; alert,appropriate. PULM: Breathing comfortably in no respiratory distress PSYCH: Normally  interactive.   Shoulder: Right Inspection: No muscle wasting or winging Ecchymosis/edema: neg  AC joint, scapula, clavicle: Tender to palpation, there is soft tissue swelling: All Cervical spine: NT, full ROM Spurling's: neg Abduction: To 155 degrees, 5/5 Flexion: To 160 degrees, 5/5 IR, full, lift-off: 4 /5 ER at neutral: full, 4 - /5 AC crossover: Positive Neer: pos Hawkins: pos Drop Test: neg Empty Can: pos Supraspinatus insertion: Tender Bicipital groove: Tender Speed's: neg Yergason's: neg Sulcus sign: neg Scapular dyskinesis: none C5-T1 intact  Neuro: Sensation intact Grip 5/5   Radiology: DG Shoulder Right  Result Date: 04/12/2020 CLINICAL DATA:  Pain with limitation of motion EXAM: RIGHT SHOULDER - 2+ VIEW COMPARISON:  None. FINDINGS: Oblique, Y scapular, axillary images were obtained. No fracture or dislocation. There is moderate generalized osteoarthritic change. No erosive change or intra-articular calcification. Visualized right lung clear. IMPRESSION: Moderate generalized osteoarthritic change. No fracture or dislocation. Electronically Signed   By: Lowella Grip III M.D.   On: 04/12/2020 10:03     Assessment and Plan:     ICD-10-CM   1. Glenohumeral arthritis, right  M19.011 methylPREDNISolone acetate (DEPO-MEDROL) injection 80 mg  2. Chronic right shoulder pain  M25.511 DG Shoulder Right   G89.29   3. Rotator cuff tendinitis, right  M75.81    The predominant issue here is advanced for  age glenohumeral arthritis on the right.  This accounts for loss of motion.  This accounts for the chronic grinding, popping, pain with terminal range of motion.  She also has some modest rotator cuff tendinitis right now.  The obvious answer is her increased care of her grandson and picking him up and carrying him around.  I recommended to the patient and her husband that she decrease lifting her grandson for approximately the next week.  I think that she would also  benefit acutely if I did an intra-articular shoulder injection.  I appreciate the opportunity to evaluate this very friendly patient. If you have any question regarding her care or prognosis, do not hesitate to ask.   Intraarticular Shoulder Aspiration/Injection Procedure Note Kim Brown 06-Mar-1951 Date of procedure: 04/12/2020  Procedure: Large Joint Aspiration / Injection of Shoulder, Intraarticular, RIGHT Indications: Pain  Procedure Details Verbal consent was obtained from the patient. Risks including infection explained and contrasted with benefits and alternatives. Patient prepped with Chloraprep and Ethyl Chloride used for anesthesia. An intraarticular shoulder injection was performed using the posterior approach; needle placed into joint capsule without difficulty. The patient tolerated the procedure well and had decreased pain post injection. No complications. Injection: 8 cc of Lidocaine 1% and 2 mL Depo-Medrol 40 mg. Needle: 21 gauge, 2 inch   Patient Instructions  Over the counter: Voltaren (Generic Diclofenac) 1% gel.  You can use this up to 4 times a day on any joint or muscle injury * This costs about 9 dollars for 1 tube.    Follow-up: No follow-ups on file.  Meds ordered this encounter  Medications  . methylPREDNISolone acetate (DEPO-MEDROL) injection 80 mg   Medications Discontinued During This Encounter  Medication Reason  . azelastine (ASTELIN) 0.1 % nasal spray Completed Course   Orders Placed This Encounter  Procedures  . DG Shoulder Right    Signed,  Frederico Hamman T. Olufemi Mofield, MD   Outpatient Encounter Medications as of 04/12/2020  Medication Sig  . Ascorbic Acid (VITAMIN C) 1000 MG tablet Take 1,000 mg by mouth 2 (two) times daily.  Marland Kitchen b complex vitamins tablet Take 1 tablet by mouth daily.    . Calcium Carbonate-Vitamin D (CALTRATE 600+D PO) Take by mouth daily.    . cetirizine (ZYRTEC ALLERGY) 10 MG tablet Take 10 mg by mouth daily.  .  Cholecalciferol (VITAMIN D) 2000 UNITS CAPS Take 1 capsule by mouth 2 (two) times daily.    Marland Kitchen co-enzyme Q-10 30 MG capsule Take 30 mg by mouth as needed.    Marland Kitchen DHEA 25 MG CAPS Take by mouth daily.    . fish oil-omega-3 fatty acids 1000 MG capsule Take 1 g by mouth daily.  . Magnesium 250 MG TABS Take by mouth daily.    . Melatonin 3 MG TABS Take 10 mg by mouth at bedtime.   . Multiple Vitamins-Minerals (CENTRUM SILVER ULTRA WOMENS) TABS Take by mouth daily.    . NON FORMULARY Progesterone pellet per outside clinic.  . progesterone (PROMETRIUM) 100 MG capsule   . thyroid (ARMOUR) 60 MG tablet 90mg  in AM and 60mg  in PM  . triamcinolone cream (KENALOG) 0.1 % Apply 1 application topically as needed.  . vitamin A 8000 UNIT capsule Take 8,000 Units by mouth daily.    . [DISCONTINUED] azelastine (ASTELIN) 0.1 % nasal spray Place 2 sprays into both nostrils 2 (two) times daily. (Patient not taking: Reported on 09/15/2019)  . [EXPIRED] methylPREDNISolone acetate (DEPO-MEDROL) injection 80 mg  No facility-administered encounter medications on file as of 04/12/2020.

## 2020-06-02 ENCOUNTER — Other Ambulatory Visit: Payer: Self-pay

## 2020-06-02 ENCOUNTER — Ambulatory Visit (INDEPENDENT_AMBULATORY_CARE_PROVIDER_SITE_OTHER): Payer: PPO | Admitting: Family Medicine

## 2020-06-02 ENCOUNTER — Encounter: Payer: Self-pay | Admitting: Family Medicine

## 2020-06-02 VITALS — BP 100/60 | HR 85 | Temp 98.5°F | Ht 63.5 in | Wt 169.2 lb

## 2020-06-02 DIAGNOSIS — M7501 Adhesive capsulitis of right shoulder: Secondary | ICD-10-CM

## 2020-06-02 DIAGNOSIS — M25811 Other specified joint disorders, right shoulder: Secondary | ICD-10-CM

## 2020-06-02 DIAGNOSIS — M19011 Primary osteoarthritis, right shoulder: Secondary | ICD-10-CM | POA: Diagnosis not present

## 2020-06-02 NOTE — Patient Instructions (Signed)
OSTEOARTHRITIS: Over the counter  Tylenol: 2 tablets up to 3-4 times a day Regular NSAIDS are helpful (avoid in kidney disease and ulcers)  Topical Capzaicin Cream, as needed (wear glove to put on) - THIS IS EXCEPTIONALLY HOT  Supplements: Tart cherry juice and Curcumin (Turmeric extract) have good scientific evidence  - get the concentrated capsules or gelcaps over the counter so you do not get the calories from the juice.  Weight loss will always take stress off of the joints and back  Volaren 1% gel. Over the counter You can apply up to 4 times a day Minimal is absorbed in the bloodstream Cost is about 9 dollars  Ice joints on bad days, 20 min, 2-3 x / day REGULAR EXERCISE: swimming, Yoga, Tai Chi, bicycle (NON-IMPACT activity)   

## 2020-06-02 NOTE — Progress Notes (Signed)
Shanquita Ronning T. Manuel Dall, MD, [place] at [facility] [place], [address]  Phone: [phone]  FAX: [phone]  Kim Brown - 69 y.o. female  MRN [id number]  Date of Birth: [birth year]  Date: 06/02/2020  PCP: Tonia Ghent, MD  Referral: Tonia Ghent, MD  Chief Complaint  Patient presents with  . Shoulder Pain    Right  . Foot Orthotics    would like to talk about getting othotics    This visit occurred during the SARS-CoV-2 public health emergency.  Safety protocols were in place, including screening questions prior to the visit, additional usage of staff PPE, and extensive cleaning of exam room while observing appropriate contact time as indicated for disinfecting solutions.   Subjective:   Kim Brown is a 69 y.o. very pleasant female patient with Body mass index is 29.51 kg/m. who presents with the following:  R shoulder pain: She continues to have some difficulty and pain deep in the shoulder as well as in the [facility] joint.  Her motion has improved as well as her pain decreased, but she is still symptomatic.  I brought up the patient's shoulder films and independently reviewed by myself with her and her husband face-to-face.  She does have moderate glenohumeral osteoarthritis as well as advanced acromioclavicular arthritis with numerous osteophytes.  She also has what appears to be calcification at the rotator cuff insertion.Electronically Signed  By: Owens Loffler, MD On: 06/02/2020 10:20 AM EDT   She has pain with terminal motion in abduction, flexion, as well as internal range of motion.  On her last office visit I did do an intra-articular injection of her shoulder which provided some relief of her symptoms, but this was not long-lasting.  She also has a palpable area in the caudal aspect of the shoulder just medial to the acromioclavicular joint.  This is elevated and  freely mobile.  Orthotics ?  Review of Systems is noted in the HPI, as appropriate   Objective:   BP 100/60   Pulse 85   Temp 98.5 F (36.9 C) (Temporal)   Ht 5' 3.5" (1.613 m)   Wt 169 lb 4 oz (76.8 kg)   SpO2 97%   BMI 29.51 kg/m    GEN: No acute distress; alert,appropriate. PULM: Breathing comfortably in no respiratory distress PSYCH: Normally interactive.   Shoulder: Right Inspection: No muscle wasting or winging Ecchymosis/edema: neg  AC joint, scapula, clavicle: Tender Cervical spine: NT, full ROM Spurling's: neg Abduction: 165, 5/5 Flexion: 165, 5/5 IR, 30 degrees decreased compared to contralateral side, lift-off: 5/5 ER at neutral: Approaches range of motion of the contralateral side l, 5/5 AC crossover and compression: Positive Neer: neg Hawkins: Positive drop Test: neg Empty Can: neg Supraspinatus insertion: NT Bicipital groove: NT Speed's: neg Yergason's: neg Sulcus sign: neg Scapular dyskinesis: none C5-T1 intact Sensation intact Grip 5/5   Caudal and medial to the acromioclavicular joint there is a palpable area that is approximately 2 cm across.  This is outside of the bony structures  Radiology: DG Shoulder Right  Result Date: 04/12/2020 CLINICAL DATA:  Pain with limitation of motion EXAM: RIGHT SHOULDER - 2+ VIEW COMPARISON:  None. FINDINGS: Oblique, Y scapular, axillary images were obtained. No fracture or dislocation. There is moderate generalized osteoarthritic change. No erosive change or intra-articular calcification. Visualized right lung clear. IMPRESSION: Moderate generalized osteoarthritic change. No fracture or dislocation. Electronically Signed  By: Lowella Grip III M.D.   On: 04/12/2020 10:03   Assessment and Plan:     ICD-10-CM   1. Mass of joint of right shoulder  M25.811 MR Shoulder Right Wo Contrast  2. Glenohumeral arthritis, right  M19.011 MR Shoulder Right Wo Contrast  3. Adhesive capsulitis of right shoulder  M75.01  MR Shoulder Right Wo Contrast   Total encounter time: 30 minutes. On the day of the patient encounter, this can include review of prior records, labs, and imaging.  Additional time can include counselling, consultation with peer MD in person or by telephone.  This also includes independent review of Radiology.  Obtain an MRI of the right shoulder without contrast to better evaluate the mass caudal of those shoulder bones.  Rotator cuff tear is possible.  Neoplasm is possible.  Further visualization needed to characterize.  Plan of care will be dictated by additional MRI findings.  Follow-up: No follow-ups on file.  No orders of the defined types were placed in this encounter.  There are no discontinued medications. Orders Placed This Encounter  Procedures  . MR Shoulder Right Wo Contrast    Signed,  Messiyah Waterson T. Leiam Hopwood, MD   Outpatient Encounter Medications as of 06/02/2020  Medication Sig  . Ascorbic Acid (VITAMIN C) 1000 MG tablet Take 1,000 mg by mouth 2 (two) times daily.  Marland Kitchen b complex vitamins tablet Take 1 tablet by mouth daily.    . Calcium Carbonate-Vitamin D (CALTRATE 600+D PO) Take by mouth daily.    . cetirizine (ZYRTEC ALLERGY) 10 MG tablet Take 10 mg by mouth daily.  . Cholecalciferol (VITAMIN D) 2000 UNITS CAPS Take 1 capsule by mouth 2 (two) times daily.    Marland Kitchen co-enzyme Q-10 30 MG capsule Take 30 mg by mouth as needed.    Marland Kitchen DHEA 25 MG CAPS Take by mouth daily.    . fish oil-omega-3 fatty acids 1000 MG capsule Take 1 g by mouth daily.  . Magnesium 250 MG TABS Take by mouth daily.    . Melatonin 3 MG TABS Take 10 mg by mouth at bedtime.   . Multiple Vitamins-Minerals (CENTRUM SILVER ULTRA WOMENS) TABS Take by mouth daily.    . NON FORMULARY Progesterone pellet per outside clinic.  . progesterone (PROMETRIUM) 100 MG capsule   . thyroid (ARMOUR) 60 MG tablet 90mg  in AM and 60mg  in PM  . triamcinolone cream (KENALOG) 0.1 % Apply 1 application topically as needed.  .  vitamin A 8000 UNIT capsule Take 8,000 Units by mouth daily.     No facility-administered encounter medications on file as of 06/02/2020.

## 2020-06-06 ENCOUNTER — Other Ambulatory Visit: Payer: Self-pay

## 2020-06-06 ENCOUNTER — Ambulatory Visit (INDEPENDENT_AMBULATORY_CARE_PROVIDER_SITE_OTHER): Payer: PPO

## 2020-06-06 DIAGNOSIS — M19011 Primary osteoarthritis, right shoulder: Secondary | ICD-10-CM

## 2020-06-06 DIAGNOSIS — M7501 Adhesive capsulitis of right shoulder: Secondary | ICD-10-CM | POA: Diagnosis not present

## 2020-06-06 DIAGNOSIS — M25811 Other specified joint disorders, right shoulder: Secondary | ICD-10-CM

## 2020-06-06 DIAGNOSIS — M25511 Pain in right shoulder: Secondary | ICD-10-CM | POA: Diagnosis not present

## 2020-06-07 ENCOUNTER — Telehealth: Payer: Self-pay

## 2020-06-07 DIAGNOSIS — M75101 Unspecified rotator cuff tear or rupture of right shoulder, not specified as traumatic: Secondary | ICD-10-CM

## 2020-06-07 DIAGNOSIS — S46219A Strain of muscle, fascia and tendon of other parts of biceps, unspecified arm, initial encounter: Secondary | ICD-10-CM

## 2020-06-07 DIAGNOSIS — S46811A Strain of other muscles, fascia and tendons at shoulder and upper arm level, right arm, initial encounter: Secondary | ICD-10-CM

## 2020-06-07 NOTE — Telephone Encounter (Signed)
Patient contacted the office and states that she spoke with Butch Penny earlier regarding a referral to a shoulder surgeon. Patient states she reviewed the information and she would like to move forward with the referral to Dr. Tamera Punt.

## 2020-06-07 NOTE — Telephone Encounter (Signed)
Patient stated that she missed a phone call about getting a referral completed. See message below  She also wanted to know, Dr Lorelei Pont gave her a list of exercises that she could do and wanted to know if she should continue doing those

## 2020-06-08 NOTE — Addendum Note (Signed)
Addended by: Owens Loffler on: 06/08/2020 08:51 AM   Modules accepted: Orders

## 2020-06-08 NOTE — Telephone Encounter (Signed)
Definitely keep up with the home rehab  I put in a referral to see Dr. Tamera Punt  It may take a couple of weeks to get in based on his schedule, but it is ok if that is the case.

## 2020-06-08 NOTE — Telephone Encounter (Signed)
Kim Brown notified as instructed by telephone.

## 2020-06-23 DIAGNOSIS — M19211 Secondary osteoarthritis, right shoulder: Secondary | ICD-10-CM | POA: Diagnosis not present

## 2020-06-23 DIAGNOSIS — M24811 Other specific joint derangements of right shoulder, not elsewhere classified: Secondary | ICD-10-CM | POA: Diagnosis not present

## 2020-06-25 ENCOUNTER — Other Ambulatory Visit: Payer: Self-pay | Admitting: Orthopedic Surgery

## 2020-06-27 ENCOUNTER — Encounter: Payer: Self-pay | Admitting: Family Medicine

## 2020-07-09 NOTE — Patient Instructions (Addendum)
DUE TO COVID-19 ONLY ONE VISITOR IS ALLOWED TO COME WITH YOU AND STAY IN THE WAITING ROOM ONLY DURING PRE OP AND PROCEDURE DAY OF SURGERY. THE 2 VISITORS  MAY VISIT WITH YOU AFTER SURGERY IN YOUR PRIVATE ROOM DURING VISITING HOURS ONLY!  YOU NEED TO HAVE A COVID 19 TEST ON_7/26______ @__12 :50_____, THIS TEST MUST BE DONE BEFORE SURGERY, COME  [place] , [address].  ([place]) ONCE YOUR COVID TEST IS COMPLETED, PLEASE BEGIN THE QUARANTINE INSTRUCTIONS AS OUTLINED IN YOUR HANDOUT.                Kim Brown    Your procedure is scheduled on: 07/15/20   Report to [facility] Main  Entrance   Report to admitting at  10:10 AM     Call this number if you have problems the morning of surgery [phone]    . BRUSH YOUR TEETH MORNING OF SURGERY AND RINSE YOUR MOUTH OUT, NO CHEWING GUM CANDY OR MINTS.    Do not eat food After Midnight.    YOU MAY HAVE CLEAR LIQUIDS FROM MIDNIGHT UNTIL 9:30AM    CLEAR LIQUID DIET   Foods Allowed                                                                     Foods Excluded  Coffee and tea, regular and decaf                             liquids that you cannot  Plain Jell-O any favor except red or purple                                           see through such as: Fruit ices (not with fruit pulp)                                     milk, soups, orange juice  Iced Popsicles                                    All solid food Carbonated beverages, regular and diet                                    Cranberry, grape and apple juices Sports drinks like Gatorade Lightly seasoned clear broth or consume(fat free) Sugar, honey syrup   At 9:30AM Please finish the prescribed Pre-Surgery  drink  . Nothing by mouth after you finish the drink !    Take these medicines the morning of surgery with A SIP OF WATER: Armour                                 You may not have any metal on your body including hair  pins and  piercings  Do not wear jewelry, make-up, lotions, powders or perfumes, deodorant             Do not wear nail polish on your fingernails.  Do not shave  48 hours prior to surgery.     Do not bring valuables to the hospital. [place].  Contacts, dentures or bridgework may not be worn into surgery.  Leave suitcase in the car. After surgery it may be brought to your room.   Patients discharged the day of surgery will not be allowed to drive home . IF YOU ARE HAVING SURGERY AND GOING HOME THE SAME DAY, YOU MUST HAVE AN ADULT TO DRIVE YOU HOME AND BE WITH YOU FOR 24 HOURS.  YOU MAY GO HOME BY TAXI OR UBER OR ORTHERWISE, BUT AN ADULT MUST ACCOMPANY YOU HOME AND STAY WITH YOU FOR 24 HOURS.  Name and phone number of your driver:  Special Instructions: N/A              Please read over the following fact sheets you were given: _____________________________________________________________________             [facility]- Preparing for Total Shoulder Arthroplasty    Before surgery, you can play an important role. Because skin is not sterile, your skin needs to be as free of germs as possible. You can reduce the number of germs on your skin by using the following products. . Benzoyl Peroxide Gel o Reduces the number of germs present on the skin o Applied twice a day to shoulder area starting two days before surgery    ==================================================================  Please follow these instructions carefully:  BENZOYL PEROXIDE 5% GEL  Please do not use if you have an allergy to benzoyl peroxide.   If your skin becomes reddened/irritated stop using the benzoyl peroxide.  Starting two days before surgery, apply as follows: 1. Apply benzoyl peroxide in the morning and at night. Apply after taking a shower. If you are not taking a shower clean entire shoulder front, back, and side along with the armpit  with a clean wet washcloth.  2. Place a quarter-sized dollop on your shoulder and rub in thoroughly, making sure to cover the front, back, and side of your shoulder, along with the armpit.   2 days before ____ AM   ____ PM              1 day before ____ AM   ____ PM                         3. Do this twice a day for two days.  (Last application is the night before surgery, AFTER using the CHG soap as described below).  4. Do NOT apply benzoyl peroxide gel on the day of surgery. 5.  [place] - Preparing for Surgery  Before surgery, you can play an important role .  Because skin is not sterile, your skin needs to be as free of germs as possible.   You can reduce the number of germs on your skin by washing with CHG (chlorahexidine gluconate) soap before surgery.   CHG is an antiseptic cleaner which kills germs and bonds with the skin to continue killing germs even after washing. Please DO NOT use if you have an allergy to CHG or antibacterial soaps.   If your  skin becomes reddened/irritated stop using the CHG and inform your nurse when you arrive at Short Stay. Do not shave (including legs and underarms) for at least 48 hours prior to the first CHG shower.   Please follow these instructions carefully:  1.  Shower with CHG Soap the night before surgery and the  morning of Surgery.  2.  If you choose to wash your hair, wash your hair first as usual with your  normal  shampoo.  3.  After you shampoo, rinse your hair and body thoroughly to remove the  shampoo.                                        4.  Use CHG as you would any other liquid soap.  You can apply chg directly  to the skin and wash                       Gently with a scrungie or clean washcloth.  5.  Apply the CHG Soap to your body ONLY FROM THE NECK DOWN.   Do not use on face/ open                           Wound or open sores. Avoid contact with eyes, ears mouth and genitals (private parts).                       Wash face,   Genitals (private parts) with your normal soap.             6.  Wash thoroughly, paying special attention to the area where your surgery  will be performed.  7.  Thoroughly rinse your body with warm water from the neck down.  8.  DO NOT shower/wash with your normal soap after using and rinsing off  the CHG Soap.             9.  Pat yourself dry with a clean towel.            10.  Wear clean pajamas.            11.  Place clean sheets on your bed the night of your first shower and do not  sleep with pets. Day of Surgery : Do not apply any lotions/deodorants the morning of surgery.  Please wear clean clothes to the hospital/surgery center.  FAILURE TO FOLLOW THESE INSTRUCTIONS MAY RESULT IN THE CANCELLATION OF YOUR SURGERY PATIENT SIGNATURE_________________________________  NURSE SIGNATURE__________________________________  ________________________________________________________________________   Adam Phenix  An incentive spirometer is a tool that can help keep your lungs clear and active. This tool measures how well you are filling your lungs with each breath. Taking long deep breaths may help reverse or decrease the chance of developing breathing (pulmonary) problems (especially infection) following:  A long period of time when you are unable to move or be active. BEFORE THE PROCEDURE   If the spirometer includes an indicator to show your best effort, your nurse or respiratory therapist will set it to a desired goal.  If possible, sit up straight or lean slightly forward. Try not to slouch.  Hold the incentive spirometer in an upright position. INSTRUCTIONS FOR USE  1. Sit on the edge of your bed if possible, or sit up as far as you can in  bed or on a chair. 2. Hold the incentive spirometer in an upright position. 3. Breathe out normally. 4. Place the mouthpiece in your mouth and seal your lips tightly around it. 5. Breathe in slowly and as deeply as possible, raising the  piston or the ball toward the top of the column. 6. Hold your breath for 3-5 seconds or for as long as possible. Allow the piston or ball to fall to the bottom of the column. 7. Remove the mouthpiece from your mouth and breathe out normally. 8. Rest for a few seconds and repeat Steps 1 through 7 at least 10 times every 1-2 hours when you are awake. Take your time and take a few normal breaths between deep breaths. 9. The spirometer may include an indicator to show your best effort. Use the indicator as a goal to work toward during each repetition. 10. After each set of 10 deep breaths, practice coughing to be sure your lungs are clear. If you have an incision (the cut made at the time of surgery), support your incision when coughing by placing a pillow or rolled up towels firmly against it. Once you are able to get out of bed, walk around indoors and cough well. You may stop using the incentive spirometer when instructed by your caregiver.  RISKS AND COMPLICATIONS  Take your time so you do not get dizzy or light-headed.  If you are in pain, you may need to take or ask for pain medication before doing incentive spirometry. It is harder to take a deep breath if you are having pain. AFTER USE  Rest and breathe slowly and easily.  It can be helpful to keep track of a log of your progress. Your caregiver can provide you with a simple table to help with this. If you are using the spirometer at home, follow these instructions: [place] IF:   You are having difficultly using the spirometer.  You have trouble using the spirometer as often as instructed.  Your pain medication is not giving enough relief while using the spirometer.  You develop fever of 100.5 F (38.1 C) or higher. SEEK IMMEDIATE MEDICAL CARE IF:   You cough up bloody sputum that had not been present before.  You develop fever of 102 F (38.9 C) or greater.  You develop worsening pain at or near the incision  site. MAKE SURE YOU:   Understand these instructions.  Will watch your condition.  Will get help right away if you are not doing well or get worse. Document Released: 04/16/2007 Document Revised: 02/26/2012 Document Reviewed: 06/17/2007 [facility] Patient Information 2014 [place], [place].   ________________________________________________________________________

## 2020-07-12 ENCOUNTER — Other Ambulatory Visit (HOSPITAL_COMMUNITY)
Admission: RE | Admit: 2020-07-12 | Discharge: 2020-07-12 | Disposition: A | Discharge status: Home / Self Care | Payer: PPO | Source: Ambulatory Visit | Attending: Orthopedic Surgery | Admitting: Orthopedic Surgery

## 2020-07-12 ENCOUNTER — Encounter (HOSPITAL_COMMUNITY): Payer: Self-pay

## 2020-07-12 ENCOUNTER — Encounter (HOSPITAL_COMMUNITY)
Admission: RE | Admit: 2020-07-12 | Discharge: 2020-07-12 | Disposition: A | Discharge status: Home / Self Care | Payer: PPO | Source: Ambulatory Visit | Attending: Orthopedic Surgery | Admitting: Orthopedic Surgery

## 2020-07-12 ENCOUNTER — Other Ambulatory Visit: Payer: Self-pay

## 2020-07-12 ENCOUNTER — Ambulatory Visit (HOSPITAL_COMMUNITY)
Admission: RE | Admit: 2020-07-12 | Discharge: 2020-07-12 | Disposition: A | Discharge status: Home / Self Care | Payer: PPO | Source: Ambulatory Visit | Attending: Orthopedic Surgery | Admitting: Orthopedic Surgery

## 2020-07-12 DIAGNOSIS — Z01811 Encounter for preprocedural respiratory examination: Secondary | ICD-10-CM

## 2020-07-12 DIAGNOSIS — Z20822 Contact with and (suspected) exposure to covid-19: Secondary | ICD-10-CM | POA: Insufficient documentation

## 2020-07-12 DIAGNOSIS — Z01818 Encounter for other preprocedural examination: Secondary | ICD-10-CM | POA: Insufficient documentation

## 2020-07-12 LAB — COMPREHENSIVE METABOLIC PANEL
ALT: 16 U/L (ref 0–44)
AST: 16 U/L (ref 15–41)
Albumin: 4.2 g/dL (ref 3.5–5.0)
Alkaline Phosphatase: 66 U/L (ref 38–126)
Anion gap: 12 (ref 5–15)
BUN: 15 mg/dL (ref 8–23)
CO2: 26 mmol/L (ref 22–32)
Calcium: 9.5 mg/dL (ref 8.9–10.3)
Chloride: 101 mmol/L (ref 98–111)
Creatinine, Ser: 0.81 mg/dL (ref 0.44–1.00)
GFR calc Af Amer: >60 mL/min (ref >60)
GFR calc non Af Amer: >60 mL/min (ref >60)
Glucose, Bld: 112 mg/dL — ABNORMAL HIGH (ref 70–99)
Potassium: 4.2 mmol/L (ref 3.5–5.1)
Sodium: 139 mmol/L (ref 135–145)
Total Bilirubin: 0.5 mg/dL (ref 0.3–1.2)
Total Protein: 7.3 g/dL (ref 6.5–8.1)

## 2020-07-12 LAB — PROTIME-INR
INR: 1 (ref 0.8–1.2)
Prothrombin Time: 13.1 seconds (ref 11.4–15.2)

## 2020-07-12 LAB — CBC WITH DIFFERENTIAL/PLATELET
Abs Immature Granulocytes: 0.07 10*3/uL (ref 0.00–0.07)
Basophils Absolute: 0.1 10*3/uL (ref 0.0–0.1)
Basophils Relative: 0 %
Eosinophils Absolute: 0.1 10*3/uL (ref 0.0–0.5)
Eosinophils Relative: 1 %
HCT: 46.4 % — ABNORMAL HIGH (ref 36.0–46.0)
Hemoglobin: 15 g/dL (ref 12.0–15.0)
Immature Granulocytes: 1 %
Lymphocytes Relative: 18 %
Lymphs Abs: 2 10*3/uL (ref 0.7–4.0)
MCH: 30.5 pg (ref 26.0–34.0)
MCHC: 32.3 g/dL (ref 30.0–36.0)
MCV: 94.3 fL (ref 80.0–100.0)
Monocytes Absolute: 0.7 10*3/uL (ref 0.1–1.0)
Monocytes Relative: 6 %
Neutro Abs: 8.3 10*3/uL — ABNORMAL HIGH (ref 1.7–7.7)
Neutrophils Relative %: 74 %
Platelets: 308 10*3/uL (ref 150–400)
RBC: 4.92 MIL/uL (ref 3.87–5.11)
RDW: 13.7 % (ref 11.5–15.5)
WBC: 11.2 10*3/uL — ABNORMAL HIGH (ref 4.0–10.5)
nRBC: 0 % (ref 0.0–0.2)

## 2020-07-12 LAB — URINALYSIS, ROUTINE W REFLEX MICROSCOPIC
Bilirubin Urine: NEGATIVE
Glucose, UA: NEGATIVE mg/dL
Ketones, ur: NEGATIVE mg/dL
Leukocytes,Ua: NEGATIVE
Nitrite: NEGATIVE
Protein, ur: NEGATIVE mg/dL
Specific Gravity, Urine: 1.002 — ABNORMAL LOW (ref 1.005–1.030)
pH: 8 (ref 5.0–8.0)

## 2020-07-12 LAB — SURGICAL PCR SCREEN
MRSA, PCR: NEGATIVE
Staphylococcus aureus: POSITIVE — AB

## 2020-07-12 LAB — SARS CORONAVIRUS 2 (TAT 6-24 HRS): SARS Coronavirus 2: NEGATIVE

## 2020-07-12 LAB — APTT: aPTT: 29 seconds (ref 24–36)

## 2020-07-12 NOTE — Progress Notes (Signed)
COVID Vaccine Completed:No Date COVID Vaccine completed: COVID vaccine manufacturer: [employer]   PCP - Dr. Orlinda Blalock Cardiologist -   Chest x-ray - 07/12/20 EKG - 07/12/20 Stress Test - Pt states [facility] She doesn't recall the MD who did the cardiac work up. ECHO -  Cardiac Cath -   Sleep Study - no CPAP -   Fasting Blood Sugar - NA Checks Blood Sugar _____ times a day  Blood Thinner Instructions:NA Aspirin Instructions: Last Dose:  Anesthesia review:   Patient denies shortness of breath, fever, cough and chest pain at PAT appointment  yes   Patient verbalized understanding of instructions that were given to them at the PAT appointment. Patient was also instructed that they will need to review over the PAT instructions again at home before surgery. Yes  Pt is able to climb 2 flights of stairs. She has no SOB with house work or ADLs.  She has RSD syndrome in the Rt foot.

## 2020-07-13 ENCOUNTER — Other Ambulatory Visit: Payer: Self-pay

## 2020-07-15 ENCOUNTER — Ambulatory Visit (HOSPITAL_COMMUNITY)
Admission: RE | Admit: 2020-07-15 | Discharge: 2020-07-15 | Disposition: A | Discharge status: Home / Self Care | Payer: PPO | Source: Ambulatory Visit | Attending: Orthopedic Surgery | Admitting: Orthopedic Surgery

## 2020-07-15 ENCOUNTER — Ambulatory Visit (HOSPITAL_COMMUNITY): Payer: PPO | Admitting: Certified Registered Nurse Anesthetist

## 2020-07-15 ENCOUNTER — Encounter (HOSPITAL_COMMUNITY)
Admission: RE | Disposition: A | Discharge status: Home / Self Care | Payer: Self-pay | Source: Ambulatory Visit | Attending: Orthopedic Surgery

## 2020-07-15 ENCOUNTER — Ambulatory Visit (HOSPITAL_COMMUNITY): Payer: PPO

## 2020-07-15 ENCOUNTER — Encounter (HOSPITAL_COMMUNITY): Payer: Self-pay | Admitting: Orthopedic Surgery

## 2020-07-15 DIAGNOSIS — Z79890 Hormone replacement therapy (postmenopausal): Secondary | ICD-10-CM | POA: Insufficient documentation

## 2020-07-15 DIAGNOSIS — M25711 Osteophyte, right shoulder: Secondary | ICD-10-CM | POA: Insufficient documentation

## 2020-07-15 DIAGNOSIS — Z888 Allergy status to other drugs, medicaments and biological substances status: Secondary | ICD-10-CM | POA: Insufficient documentation

## 2020-07-15 DIAGNOSIS — I10 Essential (primary) hypertension: Secondary | ICD-10-CM | POA: Diagnosis not present

## 2020-07-15 DIAGNOSIS — M12811 Other specific arthropathies, not elsewhere classified, right shoulder: Secondary | ICD-10-CM | POA: Diagnosis not present

## 2020-07-15 DIAGNOSIS — Z79899 Other long term (current) drug therapy: Secondary | ICD-10-CM | POA: Insufficient documentation

## 2020-07-15 DIAGNOSIS — M19071 Primary osteoarthritis, right ankle and foot: Secondary | ICD-10-CM | POA: Insufficient documentation

## 2020-07-15 DIAGNOSIS — M19042 Primary osteoarthritis, left hand: Secondary | ICD-10-CM | POA: Diagnosis not present

## 2020-07-15 DIAGNOSIS — Z8261 Family history of arthritis: Secondary | ICD-10-CM | POA: Diagnosis not present

## 2020-07-15 DIAGNOSIS — Z471 Aftercare following joint replacement surgery: Secondary | ICD-10-CM | POA: Diagnosis not present

## 2020-07-15 DIAGNOSIS — E7800 Pure hypercholesterolemia, unspecified: Secondary | ICD-10-CM | POA: Diagnosis not present

## 2020-07-15 DIAGNOSIS — G8918 Other acute postprocedural pain: Secondary | ICD-10-CM | POA: Diagnosis not present

## 2020-07-15 DIAGNOSIS — M19072 Primary osteoarthritis, left ankle and foot: Secondary | ICD-10-CM | POA: Diagnosis not present

## 2020-07-15 DIAGNOSIS — M75121 Complete rotator cuff tear or rupture of right shoulder, not specified as traumatic: Secondary | ICD-10-CM | POA: Diagnosis not present

## 2020-07-15 DIAGNOSIS — E039 Hypothyroidism, unspecified: Secondary | ICD-10-CM | POA: Diagnosis not present

## 2020-07-15 DIAGNOSIS — M19011 Primary osteoarthritis, right shoulder: Secondary | ICD-10-CM | POA: Diagnosis not present

## 2020-07-15 DIAGNOSIS — Z96611 Presence of right artificial shoulder joint: Secondary | ICD-10-CM

## 2020-07-15 DIAGNOSIS — M19041 Primary osteoarthritis, right hand: Secondary | ICD-10-CM | POA: Insufficient documentation

## 2020-07-15 DIAGNOSIS — M75101 Unspecified rotator cuff tear or rupture of right shoulder, not specified as traumatic: Secondary | ICD-10-CM | POA: Insufficient documentation

## 2020-07-15 DIAGNOSIS — Z8249 Family history of ischemic heart disease and other diseases of the circulatory system: Secondary | ICD-10-CM | POA: Insufficient documentation

## 2020-07-15 HISTORY — PX: REVERSE SHOULDER ARTHROPLASTY: SHX5054

## 2020-07-15 LAB — TYPE AND SCREEN
ABO/RH(D): A POS
Antibody Screen: NEGATIVE

## 2020-07-15 LAB — ABO/RH: ABO/RH(D): A POS

## 2020-07-15 SURGERY — ARTHROPLASTY, SHOULDER, TOTAL, REVERSE
Anesthesia: General | Site: Shoulder | Laterality: Right

## 2020-07-15 MED ORDER — PHENYLEPHRINE HCL (PRESSORS) 10 MG/ML IV SOLN
INTRAVENOUS | Status: AC
  Filled 2020-07-15: qty 1

## 2020-07-15 MED ORDER — DEXAMETHASONE SODIUM PHOSPHATE 10 MG/ML IJ SOLN
INTRAMUSCULAR | Status: DC | PRN
  Administered 2020-07-15: 5 mg via INTRAVENOUS

## 2020-07-15 MED ORDER — ONDANSETRON HCL 4 MG/2ML IJ SOLN
INTRAMUSCULAR | Status: AC
  Filled 2020-07-15: qty 2

## 2020-07-15 MED ORDER — FENTANYL CITRATE (PF) 100 MCG/2ML IJ SOLN
50.0000 ug | INTRAMUSCULAR | Status: DC
  Administered 2020-07-15: 50 ug via INTRAVENOUS
  Filled 2020-07-15: qty 2

## 2020-07-15 MED ORDER — CEFAZOLIN SODIUM-DEXTROSE 2-4 GM/100ML-% IV SOLN
2.0000 g | INTRAVENOUS | Status: AC
  Administered 2020-07-15: 2 g via INTRAVENOUS
  Filled 2020-07-15: qty 100

## 2020-07-15 MED ORDER — TIZANIDINE HCL 4 MG PO TABS
4.0000 mg | ORAL_TABLET | Freq: Three times a day (TID) | ORAL | 1 refills | Status: DC | PRN
Start: 2020-07-15 — End: 2020-11-24

## 2020-07-15 MED ORDER — ACETAMINOPHEN 500 MG PO TABS
1000.0000 mg | ORAL_TABLET | Freq: Once | ORAL | Status: AC
  Administered 2020-07-15: 1000 mg via ORAL
  Filled 2020-07-15: qty 2

## 2020-07-15 MED ORDER — ROCURONIUM BROMIDE 100 MG/10ML IV SOLN
INTRAVENOUS | Status: DC | PRN
  Administered 2020-07-15: 60 mg via INTRAVENOUS

## 2020-07-15 MED ORDER — SODIUM CHLORIDE 0.9 % IR SOLN
Status: DC | PRN
  Administered 2020-07-15: 1000 mL

## 2020-07-15 MED ORDER — BUPIVACAINE HCL (PF) 0.5 % IJ SOLN
INTRAMUSCULAR | Status: DC | PRN
  Administered 2020-07-15: 10 mL via PERINEURAL

## 2020-07-15 MED ORDER — FENTANYL CITRATE (PF) 100 MCG/2ML IJ SOLN
INTRAMUSCULAR | Status: DC | PRN
  Administered 2020-07-15: 50 ug via INTRAVENOUS

## 2020-07-15 MED ORDER — MIDAZOLAM HCL 2 MG/2ML IJ SOLN
1.0000 mg | INTRAMUSCULAR | Status: DC
  Administered 2020-07-15: 2 mg via INTRAVENOUS
  Filled 2020-07-15: qty 2

## 2020-07-15 MED ORDER — BUPIVACAINE LIPOSOME 1.3 % IJ SUSP
INTRAMUSCULAR | Status: DC | PRN
  Administered 2020-07-15: 10 mL via PERINEURAL

## 2020-07-15 MED ORDER — DEXAMETHASONE SODIUM PHOSPHATE 10 MG/ML IJ SOLN
INTRAMUSCULAR | Status: AC
  Filled 2020-07-15: qty 1

## 2020-07-15 MED ORDER — ONDANSETRON HCL 4 MG/2ML IJ SOLN
INTRAMUSCULAR | Status: DC | PRN
  Administered 2020-07-15: 4 mg via INTRAVENOUS

## 2020-07-15 MED ORDER — PHENYLEPHRINE HCL-NACL 10-0.9 MG/250ML-% IV SOLN
INTRAVENOUS | Status: DC | PRN
  Administered 2020-07-15: 35 ug/min via INTRAVENOUS

## 2020-07-15 MED ORDER — LACTATED RINGERS IV SOLN: INTRAVENOUS | Status: DC

## 2020-07-15 MED ORDER — 0.9 % SODIUM CHLORIDE (POUR BTL) OPTIME
TOPICAL | Status: DC | PRN
  Administered 2020-07-15: 1000 mL

## 2020-07-15 MED ORDER — FENTANYL CITRATE (PF) 100 MCG/2ML IJ SOLN
INTRAMUSCULAR | Status: AC
  Filled 2020-07-15: qty 2

## 2020-07-15 MED ORDER — CHLORHEXIDINE GLUCONATE 0.12 % MT SOLN
15.0000 mL | Freq: Once | OROMUCOSAL | Status: AC
  Administered 2020-07-15: 15 mL via OROMUCOSAL

## 2020-07-15 MED ORDER — TRANEXAMIC ACID-NACL 1000-0.7 MG/100ML-% IV SOLN
1000.0000 mg | INTRAVENOUS | Status: AC
  Administered 2020-07-15: 1000 mg via INTRAVENOUS
  Filled 2020-07-15: qty 100

## 2020-07-15 MED ORDER — ROCURONIUM BROMIDE 10 MG/ML (PF) SYRINGE
PREFILLED_SYRINGE | INTRAVENOUS | Status: AC
  Filled 2020-07-15: qty 10

## 2020-07-15 MED ORDER — HYDROCODONE-ACETAMINOPHEN 5-325 MG PO TABS: 1.0000 | ORAL_TABLET | ORAL | 0 refills | Status: DC | PRN

## 2020-07-15 MED ORDER — PHENYLEPHRINE 40 MCG/ML (10ML) SYRINGE FOR IV PUSH (FOR BLOOD PRESSURE SUPPORT)
PREFILLED_SYRINGE | INTRAVENOUS | Status: DC | PRN
  Administered 2020-07-15: 80 ug via INTRAVENOUS

## 2020-07-15 MED ORDER — LIDOCAINE 2% (20 MG/ML) 5 ML SYRINGE
INTRAMUSCULAR | Status: AC
  Filled 2020-07-15: qty 5

## 2020-07-15 MED ORDER — LIDOCAINE 2% (20 MG/ML) 5 ML SYRINGE
INTRAMUSCULAR | Status: DC | PRN
  Administered 2020-07-15: 60 mg via INTRAVENOUS

## 2020-07-15 MED ORDER — PROPOFOL 10 MG/ML IV BOLUS
INTRAVENOUS | Status: DC | PRN
  Administered 2020-07-15: 130 mg via INTRAVENOUS

## 2020-07-15 MED ORDER — WATER FOR IRRIGATION, STERILE IR SOLN
Status: DC | PRN
  Administered 2020-07-15: 2000 mL

## 2020-07-15 MED ORDER — SUGAMMADEX SODIUM 200 MG/2ML IV SOLN
INTRAVENOUS | Status: DC | PRN
  Administered 2020-07-15: 150 mg via INTRAVENOUS

## 2020-07-15 MED ORDER — ORAL CARE MOUTH RINSE: 15.0000 mL | Freq: Once | OROMUCOSAL | Status: AC

## 2020-07-15 MED ORDER — PROPOFOL 10 MG/ML IV BOLUS
INTRAVENOUS | Status: AC
  Filled 2020-07-15: qty 20

## 2020-07-15 SURGICAL SUPPLY — 79 items
AID PSTN UNV HD RSTRNT DISP (MISCELLANEOUS)
BAG SPEC THK2 15X12 ZIP CLS (MISCELLANEOUS) ×1
BAG ZIPLOCK 12X15 (MISCELLANEOUS) ×3 IMPLANT
BASEPLATE P2 COATD GLND 6.5X30 (Shoulder) ×1 IMPLANT
BIT DRILL 1.6MX128 (BIT) IMPLANT
BIT DRILL 1.6MX128MM (BIT)
BIT DRILL 2.5 DIA 127 CALI (BIT) ×3 IMPLANT
BIT DRILL 4 DIA CALIBRATED (BIT) ×3 IMPLANT
BLADE SAW SAG 73X25 THK (BLADE) ×2
BLADE SAW SGTL 73X25 THK (BLADE) ×1 IMPLANT
BOOTIES KNEE HIGH SLOAN (MISCELLANEOUS) ×6 IMPLANT
BSPLAT GLND 30 STRL LF SHLDR (Shoulder) ×1 IMPLANT
CLOSURE WOUND 1/2 X4 (GAUZE/BANDAGES/DRESSINGS) ×1
COOLER ICEMAN CLASSIC (MISCELLANEOUS) IMPLANT
COVER BACK TABLE 60X90IN (DRAPES) ×3 IMPLANT
COVER SURGICAL LIGHT HANDLE (MISCELLANEOUS) ×3 IMPLANT
COVER WAND RF STERILE (DRAPES) IMPLANT
DRAPE INCISE IOBAN 66X45 STRL (DRAPES) ×3 IMPLANT
DRAPE ORTHO SPLIT 77X108 STRL (DRAPES)
DRAPE POUCH INSTRU U-SHP 10X18 (DRAPES) ×3 IMPLANT
DRAPE SHEET LG 3/4 BI-LAMINATE (DRAPES) ×6 IMPLANT
DRAPE SURG 17X11 SM STRL (DRAPES) ×3 IMPLANT
DRAPE SURG ORHT 6 SPLT 77X108 (DRAPES) IMPLANT
DRAPE U-SHAPE 47X51 STRL (DRAPES) ×3 IMPLANT
DRSG AQUACEL AG ADV 3.5X 6 (GAUZE/BANDAGES/DRESSINGS) ×3 IMPLANT
DURAPREP 26ML APPLICATOR (WOUND CARE) ×3 IMPLANT
ELECT BLADE TIP CTD 4 INCH (ELECTRODE) ×3 IMPLANT
ELECT REM PT RETURN 15FT ADLT (MISCELLANEOUS) ×3 IMPLANT
FACESHIELD WRAPAROUND (MASK) IMPLANT
GLOVE BIO SURGEON STRL SZ7 (GLOVE) ×3 IMPLANT
GLOVE BIO SURGEON STRL SZ7.5 (GLOVE) ×3 IMPLANT
GLOVE BIOGEL PI IND STRL 7.0 (GLOVE) ×1 IMPLANT
GLOVE BIOGEL PI IND STRL 8 (GLOVE) ×1 IMPLANT
GLOVE BIOGEL PI INDICATOR 7.0 (GLOVE) ×2
GLOVE BIOGEL PI INDICATOR 8 (GLOVE) ×2
GOWN STRL REUS W/TWL LRG LVL3 (GOWN DISPOSABLE) ×3 IMPLANT
GOWN STRL REUS W/TWL XL LVL3 (GOWN DISPOSABLE) ×3 IMPLANT
HANDPIECE INTERPULSE COAX TIP (DISPOSABLE) ×3
HOOD PEEL AWAY FLYTE STAYCOOL (MISCELLANEOUS) ×9 IMPLANT
INSERT SMALL SOCKET 32MM NEU (Insert) ×3 IMPLANT
KIT BASIN OR (CUSTOM PROCEDURE TRAY) ×3 IMPLANT
KIT TURNOVER KIT A (KITS) IMPLANT
MANIFOLD NEPTUNE II (INSTRUMENTS) ×3 IMPLANT
NEEDLE TROCAR POINT SZ 2 1/2 (NEEDLE) IMPLANT
NS IRRIG 1000ML POUR BTL (IV SOLUTION) ×3 IMPLANT
P2 COATDE GLNOID BSEPLT 6.5X30 (Shoulder) ×3 IMPLANT
PACK SHOULDER (CUSTOM PROCEDURE TRAY) ×3 IMPLANT
PAD COLD SHLDR WRAP-ON (PAD) IMPLANT
PROTECTOR NERVE ULNAR (MISCELLANEOUS) IMPLANT
RESTRAINT HEAD UNIVERSAL NS (MISCELLANEOUS) IMPLANT
RETRIEVER SUT HEWSON (MISCELLANEOUS) IMPLANT
SCREW BONE LOCKING RSP 5.0X14 (Screw) ×3 IMPLANT
SCREW BONE LOCKING RSP 5.0X30 (Screw) ×3 IMPLANT
SCREW BONE RSP LOCK 5X14 (Screw) ×1 IMPLANT
SCREW BONE RSP LOCK 5X18 (Screw) ×1 IMPLANT
SCREW BONE RSP LOCK 5X26 (Screw) ×1 IMPLANT
SCREW BONE RSP LOCK 5X30 (Screw) ×1 IMPLANT
SCREW BONE RSP LOCKING 18MM LG (Screw) ×3 IMPLANT
SCREW BONE RSP LOCKING 5.0X26 (Screw) ×3 IMPLANT
SCREW RETAIN W/HEAD 4MM OFFSET (Shoulder) ×3 IMPLANT
SET HNDPC FAN SPRY TIP SCT (DISPOSABLE) ×1 IMPLANT
SLING ARM IMMOBILIZER LRG (SOFTGOODS) IMPLANT
SLING ARM IMMOBILIZER MED (SOFTGOODS) IMPLANT
SPONGE LAP 18X18 RF (DISPOSABLE) ×3 IMPLANT
STEM HUMERAL 8X48 SHOULDER (Miscellaneous) ×3 IMPLANT
STRIP CLOSURE SKIN 1/2X4 (GAUZE/BANDAGES/DRESSINGS) ×2 IMPLANT
SUCTION FRAZIER HANDLE 10FR (MISCELLANEOUS)
SUCTION TUBE FRAZIER 10FR DISP (MISCELLANEOUS) IMPLANT
SUPPORT WRAP ARM LG (MISCELLANEOUS) IMPLANT
SUT ETHIBOND 2 OS 4 DA (SUTURE) IMPLANT
SUT FIBERWIRE #2 38 REV NDL BL (SUTURE)
SUT MNCRL AB 4-0 PS2 18 (SUTURE) ×3 IMPLANT
SUT VIC AB 2-0 CT1 27 (SUTURE) ×3
SUT VIC AB 2-0 CT1 TAPERPNT 27 (SUTURE) ×1 IMPLANT
SUTURE FIBERWR#2 38 REV NDL BL (SUTURE) IMPLANT
TOWEL OR 17X26 10 PK STRL BLUE (TOWEL DISPOSABLE) ×3 IMPLANT
TOWEL OR NON WOVEN STRL DISP B (DISPOSABLE) ×3 IMPLANT
WATER STERILE IRR 1000ML POUR (IV SOLUTION) ×3 IMPLANT
YANKAUER SUCT BULB TIP 10FT TU (MISCELLANEOUS) ×3 IMPLANT

## 2020-07-15 NOTE — Progress Notes (Signed)
Assisted Dr. Turk with right, ultrasound guided, interscalene  block. Side rails up, monitors on throughout procedure. See vital signs in flow sheet. Tolerated Procedure well. 

## 2020-07-15 NOTE — H&P (Signed)
Kim Brown is an 69 y.o. female.   Chief Complaint: R shoulder pain and dysfunction HPI: Endstage R shoulder rotator cuff tear arthropathy with significant pain and dysfunction, failed conservative measures.  Pain interferes with sleep and quality of life.   Past Medical History:  Diagnosis Date  . Allergy   . Anemia   . Arthritis    feet and hands , neck, shoulder  . Dysrhythmia 2006   atrial flutter  . Exogenous obesity   . Heel spur   . Hyperlipidemia   . Hypertension   . Hypothyroid   . Irregular heart beats   . Palpitations   . Prolapsed, anus    uses mag oxide powder nightly   . RSD (reflex sympathetic dystrophy)    after surgery on R foot  . Stress incontinence, female     Past Surgical History:  Procedure Laterality Date  . ABDOMINAL HYSTERECTOMY  2005  . BUNIONECTOMY    . COLONOSCOPY    . DILATION AND CURETTAGE OF UTERUS      Family History  Problem Relation Age of Onset  . Arthritis Mother   . Hypertension Mother   . Colon polyps Mother   . Heart disease Father   . Hypertension Father   . Arthritis Father   . Stroke Father   . Heart failure Brother   . Diabetes Brother   . Pancreatic cancer Brother   . Colon cancer Neg Hx   . Breast cancer Neg Hx   . Esophageal cancer Neg Hx   . Rectal cancer Neg Hx   . Stomach cancer Neg Hx    Social History:  reports that she has never smoked. She has never used smokeless tobacco. She reports that she does not drink alcohol and does not use drugs.  Allergies:  Allergies  Allergen Reactions  . Albuterol     Jittery sensation  . Lovastatin     Myalgias    Medications Prior to Admission  Medication Sig Dispense Refill  . Ascorbic Acid (VITAMIN C) 1000 MG tablet Take 1,000 mg by mouth 2 (two) times daily.    Marland Kitchen b complex vitamins tablet Take 1 tablet by mouth 3 (three) times a week.     . beta carotene 10000 UNIT capsule Take 10,000 Units by mouth daily.    . Calcium Carbonate-Vitamin D (CALTRATE 600+D  PO) Take by mouth daily.      . Cholecalciferol (VITAMIN D) 2000 UNITS CAPS Take 1 capsule by mouth 2 (two) times daily.      Marland Kitchen DHEA 25 MG CAPS Take 25 mg by mouth daily.     . fish oil-omega-3 fatty acids 1000 MG capsule Take 1 g by mouth daily.    . Magnesium 250 MG TABS Take by mouth daily.      . Melatonin 10 MG TABS Take 10 mg by mouth at bedtime.     . progesterone (PROMETRIUM) 100 MG capsule Take 300 mg by mouth at bedtime.     Marland Kitchen thyroid (ARMOUR) 60 MG tablet Take 60-90 mg by mouth See admin instructions. 90mg  in AM and 60mg  in PM     . zinc gluconate 50 MG tablet Take 50 mg by mouth daily.    . NON FORMULARY Progesterone pellet/ estrogen  per outside clinic.      Results for orders placed or performed during the hospital encounter of 07/15/20 (from the past 48 hour(s))  ABO/Rh     Status: None (Preliminary result)   Collection  Time: 07/15/20 10:40 AM  Result Value Ref Range   ABO/RH(D) PENDING    No results found.  Review of Systems  All other systems reviewed and are negative.   Blood pressure (!) 137/80, pulse 91, temperature 98.3 F (36.8 C), temperature source Oral, resp. rate 15, height 5\' 4"  (1.626 m), weight 75.1 kg, SpO2 98 %. Physical Exam HENT:     Head: Atraumatic.  Eyes:     Extraocular Movements: Extraocular movements intact.  Cardiovascular:     Pulses: Normal pulses.  Pulmonary:     Effort: Pulmonary effort is normal.  Musculoskeletal:     Comments: R shoulder pain with limited motion, NVID.  Skin:    General: Skin is warm and dry.  Neurological:     Mental Status: She is alert.  Psychiatric:        Mood and Affect: Mood normal.      Assessment/Plan R shoulder rotator cuff tear arthropathy Plan R reverse TSA Risks / benefits of surgery discussed Consent on chart  NPO for OR Preop antibiotics   Isabella Stalling, MD 07/15/2020, 11:07 AM

## 2020-07-15 NOTE — Anesthesia Procedure Notes (Signed)
Procedure Name: Intubation Date/Time: 07/15/2020 12:10 PM Performed by: Silas Sacramento, CRNA Pre-anesthesia Checklist: Patient identified, Emergency Drugs available, Suction available and Patient being monitored Patient Re-evaluated:Patient Re-evaluated prior to induction Oxygen Delivery Method: Circle system utilized Preoxygenation: Pre-oxygenation with 100% oxygen Induction Type: IV induction Ventilation: Mask ventilation without difficulty Laryngoscope Size: Mac and 3 Grade View: Grade I Tube type: Oral Number of attempts: 1 Airway Equipment and Method: Stylet Placement Confirmation: ETT inserted through vocal cords under direct vision,  positive ETCO2 and breath sounds checked- equal and bilateral Secured at: 22 cm Tube secured with: Tape Dental Injury: Teeth and Oropharynx as per pre-operative assessment

## 2020-07-15 NOTE — Op Note (Signed)
Procedure(s): REVERSE SHOULDER ARTHROPLASTY Procedure Note  Gracianna Vink female 69 y.o. 07/15/2020   Preoperative diagnosis: Right shoulder rotator cuff tear arthropathy  Postoperative diagnosis: Same   Procedure(s) and Anesthesia Type: Right REVERSE SHOULDER ARTHROPLASTY - General   Indications:  69 y.o. female  With endstage right shoulder arthritis with irrepairable rotator cuff tear. Pain and dysfunction interfered with quality of life and nonoperative treatment with activity modification, NSAIDS and injections failed.     Surgeon: Isabella Stalling   Assistants: Jeanmarie Hubert PA-C [facility] was present and scrubbed throughout the procedure and was essential in positioning, retraction, exposure, and closure)  Anesthesia: General endotracheal anesthesia with preoperative interscalene block given by attending anesthesiologist    Procedure Detail  REVERSE SHOULDER ARTHROPLASTY   Estimated Blood Loss:  200 mL         Drains: none  Blood Given: none          Specimens: none        Complications:  * No complications entered in OR log *         Disposition: PACU - hemodynamically stable.         Condition: stable      OPERATIVE FINDINGS:  A DJO Altivate pressfit reverse total shoulder arthroplasty was placed with a  size 8 stem, a 32-4 glenosphere, and a 32 standard-mm poly insert. The base plate  fixation was excellent.  PROCEDURE: The patient was identified in the preoperative holding area  where I personally marked the operative site after verifying site, side,  and procedure with the patient. An interscalene block given by  the attending anesthesiologist in the holding area and the patient was taken back to the operating room where all extremities were  carefully padded in position after general anesthesia was induced. She  was placed in a beach-chair position and the operative upper extremity was  prepped and draped in a standard sterile fashion. An  approximately 10-  cm incision was made from the tip of the coracoid process to the center  point of the humerus at the level of the axilla. Dissection was carried  down through subcutaneous tissues to the level of the cephalic vein  which was taken laterally with the deltoid. The pectoralis major was  retracted medially. The subdeltoid space was developed and the lateral  edge of the conjoined tendon was identified. The undersurface of  conjoined tendon was palpated and the musculocutaneous nerve was not in  the field. Retractor was placed underneath the conjoined and second  retractor was placed lateral into the deltoid. The circumflex humeral  artery and vessels were identified and clamped and coagulated. The  biceps tendon was absent.  The subscapularis was extremely thinned and taken down as a peel.  The  joint was then gently externally rotated while the capsule was released  from the humeral neck around to just beyond the 6 o'clock position. At  this point, the joint was dislocated and the humeral head was presented  into the wound. The excessive osteophyte formation was removed with a  large rongeur.  The cutting guide was used to make the appropriate  head cut and the head was saved for potentially bone grafting.  The glenoid was exposed with the arm in an  abducted extended position. The anterior and posterior labrum were  completely excised and the capsule was released circumferentially to  allow for exposure of the glenoid for preparation. The 2.5 mm drill was  placed using the guide in 5-10  inferior angulation and the tap was then advanced in the same hole. Small and large reamers were then used. The tap was then removed and the Metaglene was then screwed in with excellent purchase.  The peripheral guide was then used to drilled measured and filled peripheral locking screws. The size 32-4 glenosphere was then impacted on the [facility] taper and the central screw was placed. The humerus  was then again exposed and the diaphyseal reamers were used followed by the metaphyseal reamers. The final broach was left in place in the proximal trial was placed. The joint was reduced and with this implant it was felt that soft tissue tensioning was appropriate with excellent stability and excellent range of motion. Therefore, final humeral stem was placed press-fit with bone grafting.  And then the trial polyethylene inserts were tested again and the above implant was felt to be the most appropriate for final insertion. The joint was reduced taken through full range of motion and felt to be stable. Soft tissue tension was appropriate.  The joint was then copiously irrigated with pulse  lavage and the wound was then closed. The subscapularis was not repairable.  Skin was closed with 2-0 Vicryl in a deep dermal layer and 4-0  Monocryl for skin closure. Steri-Strips were applied. Sterile  dressings were then applied as well as a sling. The patient was allowed  to awaken from general anesthesia, transferred to stretcher, and taken  to recovery room in stable condition.   POSTOPERATIVE PLAN: The patient will be observed in the recovery room.  If her pain is well controlled and she is recovering well from anesthesia she could be discharged today with family.

## 2020-07-15 NOTE — Anesthesia Procedure Notes (Signed)
Anesthesia Regional Block: Interscalene brachial plexus block   Pre-Anesthetic Checklist: ,, timeout performed, Correct Patient, Correct Site, Correct Laterality, Correct Procedure, Correct Position, site marked, Risks and benefits discussed,  Surgical consent,  Pre-op evaluation,  At surgeon's request and post-op pain management  Laterality: Right  Prep: chloraprep       Needles:  Injection technique: Single-shot  Needle Type: Echogenic Stimulator Needle     Needle Length: 5cm  Needle Gauge: 22     Additional Needles:   Procedures:,,,, ultrasound used (permanent image in chart),,,,  Narrative:  Start time: 07/15/2020 11:22 AM End time: 07/15/2020 11:32 AM Injection made incrementally with aspirations every 5 mL.  Performed by: Personally  Anesthesiologist: Catalina Gravel, MD  Additional Notes: Functioning IV was confirmed and monitors were applied.  A 80mm 22ga Arrow echogenic stimulator needle was used. Sterile prep and drape, hand hygiene, and sterile gloves were used.  Negative aspiration and negative test dose prior to incremental administration of local anesthetic. The patient tolerated the procedure well.  Ultrasound guidance: relevent anatomy identified, needle position confirmed, local anesthetic spread visualized around nerve(s), vascular puncture avoided.  Image printed for medical record.

## 2020-07-15 NOTE — Anesthesia Postprocedure Evaluation (Signed)
Anesthesia Post Note  Patient: Natalyah Cummiskey  Procedure(s) Performed: REVERSE SHOULDER ARTHROPLASTY (Right Shoulder)     Patient location during evaluation: PACU Anesthesia Type: General Level of consciousness: awake and alert and awake Pain management: pain level controlled Vital Signs Assessment: post-procedure vital signs reviewed and stable Respiratory status: spontaneous breathing, nonlabored ventilation, respiratory function stable and patient connected to nasal cannula oxygen Cardiovascular status: blood pressure returned to baseline and stable Postop Assessment: no apparent nausea or vomiting Anesthetic complications: no   No complications documented.  Last Vitals:  Vitals:   07/15/20 1600 07/15/20 1631  BP: 128/77 125/73  Pulse: 78 88  Resp: 18 16  Temp:    SpO2: 97% 96%    Last Pain:  Vitals:   07/15/20 1631  TempSrc:   PainSc: 0-No pain                 Catalina Gravel

## 2020-07-15 NOTE — Transfer of Care (Signed)
Immediate Anesthesia Transfer of Care Note  Patient: Kim Brown  Procedure(s) Performed: REVERSE SHOULDER ARTHROPLASTY (Right Shoulder)  Patient Location: PACU  Anesthesia Type:General and Regional  Level of Consciousness: drowsy and patient cooperative  Airway & Oxygen Therapy: Patient Spontanous Breathing and Patient connected to face mask oxygen  Post-op Assessment: Report given to RN and Post -op Vital signs reviewed and stable  Post vital signs: Reviewed and stable  Last Vitals:  Vitals Value Taken Time  BP 143/90 07/15/20 1334  Temp 36.9 C 07/15/20 1334  Pulse 75 07/15/20 1337  Resp 12 07/15/20 1337  SpO2 100 % 07/15/20 1337  Vitals shown include unvalidated device data.  Last Pain:  Vitals:   07/15/20 1334  TempSrc:   PainSc: 0-No pain      Patients Stated Pain Goal: 3 (91/79/21 7837)  Complications: No complications documented.

## 2020-07-15 NOTE — Anesthesia Preprocedure Evaluation (Signed)
Anesthesia Evaluation  Patient identified by MRN, date of birth, ID band Patient awake    Reviewed: Allergy & Precautions, NPO status , Patient's Chart, lab work & pertinent test results  Airway Mallampati: II  TM Distance: >3 FB Neck ROM: Full    Dental  (+) Teeth Intact, Dental Advisory Given   Pulmonary neg pulmonary ROS,    Pulmonary exam normal breath sounds clear to auscultation       Cardiovascular hypertension, Normal cardiovascular exam Rhythm:Regular Rate:Normal     Neuro/Psych  Neuromuscular disease negative psych ROS   GI/Hepatic negative GI ROS, Neg liver ROS,   Endo/Other  Hypothyroidism   Renal/GU negative Renal ROS     Musculoskeletal  (+) Arthritis , Osteoarthritis,  RIGHT SHOULDER ROTATOR CUFF TEAR ARTHROPATHY   Abdominal   Peds  Hematology negative hematology ROS (+)   Anesthesia Other Findings Day of surgery medications reviewed with the patient.  Reproductive/Obstetrics                             Anesthesia Physical Anesthesia Plan  ASA: II  Anesthesia Plan: General   Post-op Pain Management:  Regional for Post-op pain   Induction: Intravenous  PONV Risk Score and Plan: 3 and Midazolam, Dexamethasone and Ondansetron  Airway Management Planned: Oral ETT  Additional Equipment:   Intra-op Plan:   Post-operative Plan: Extubation in OR  Informed Consent: I have reviewed the patients History and Physical, chart, labs and discussed the procedure including the risks, benefits and alternatives for the proposed anesthesia with the patient or authorized representative who has indicated his/her understanding and acceptance.     Dental advisory given  Plan Discussed with: CRNA  Anesthesia Plan Comments:         Anesthesia Quick Evaluation

## 2020-07-15 NOTE — Evaluation (Signed)
Occupational Therapy Evaluation Patient Details Name: Kim Brown MRN: [id number] DOB: 10/17/1951 Today's Date: 07/15/2020    History of Present Illness [name] is a 69 year old woman s/p reverse total shoulder   Clinical Impression   Patient presents s/p right shoulder replacement without functional use of right dominant upper extremity secondary to effects of surgery and interscalene block and shoulder precautions. Therapist provided education and instruction to patient and spouse in regards to exercises, precautions, positioning, donning upper extremity clothing and bathing while maintaining shoulder precautions, ice and edema management and donning/doffing sling. Patient and spouse verbalized understanding and demonstrated as needed. Patient needed assistance to donn shirt, pants, socks and shoes and provided with instruction on compensatory strategies to perform ADLs at home. Patient's spouse present to assist with ADLs. Patient to follow up with MD for further therapy needs.      Follow Up Recommendations  Follow surgeon's recommendation for DC plan and follow-up therapies    Equipment Recommendations  None recommended by OT    Recommendations for Other Services       Precautions / Restrictions Precautions Precautions: Shoulder Type of Shoulder Precautions: Conservative Protocol, hand,wrist,elbow ROM only Shoulder Interventions: Shoulder sling/immobilizer;At all times;Off for dressing/bathing/exercises Precaution Booklet Issued: No Required Braces or Orthoses: Sling Restrictions Weight Bearing Restrictions: Yes RUE Weight Bearing: Non weight bearing      Mobility Bed Mobility                  Transfers                 General transfer comment: Min guard for standing    Balance Overall balance assessment: No apparent balance deficits (not formally assessed)                                         ADL either performed or  assessed with clinical judgement   ADL Overall ADL's : Needs assistance/impaired Eating/Feeding: Set up   Grooming: Set up   Upper Body Bathing: Minimal assistance   Lower Body Bathing: Minimal assistance   Upper Body Dressing : Moderate assistance Upper Body Dressing Details (indicate cue type and reason): max assist to donn shirt for RUE and pull shirt down Lower Body Dressing: Maximal assistance Lower Body Dressing Details (indicate cue type and reason): Max assist to donn pants, shoes and socks. Assistance from husband. Educated patient to not use RUE to pull up clothing.             Functional mobility during ADLs: Min guard       Vision Baseline Vision/History: Wears glasses Wears Glasses: At all times       Perception     Praxis      Pertinent Vitals/Pain Pain Assessment: No/denies pain (No pain secondary to interscalene block)     Hand Dominance Right   Extremity/Trunk Assessment Upper Extremity Assessment Upper Extremity Assessment: RUE deficits/detail RUE Deficits / Details: Decreased ROM/strength of shoulder secondary to effects of block, shoulder precautions and impaired ROM/strength, no active elbow movement at this time, grossly able to move wrist and fingers           Communication Communication Communication: No difficulties   Cognition Arousal/Alertness: Awake/alert Behavior During Therapy: WFL for tasks assessed/performed Overall Cognitive Status: Within Functional Limits for tasks assessed  General Comments       Exercises     Shoulder Instructions Shoulder Instructions Donning/doffing shirt without moving shoulder: Patient able to independently direct caregiver;Caregiver independent with task;Moderate assistance Method for sponge bathing under operated UE: Patient able to independently direct caregiver;Caregiver independent with task;Minimal assistance Donning/doffing  sling/immobilizer: Patient able to independently direct caregiver;Caregiver independent with task;Maximal assistance Correct positioning of sling/immobilizer: Patient able to independently direct caregiver;Caregiver independent with task ROM for elbow, wrist and digits of operated UE: Caregiver independent with task;Patient able to independently direct caregiver Sling wearing schedule (on at all times/off for ADL's): Patient able to independently direct caregiver;Caregiver independent with task Proper positioning of operated UE when showering: Caregiver independent with task;Patient able to independently direct caregiver Dressing change: Patient able to independently direct caregiver;Caregiver independent with task Positioning of UE while sleeping: Caregiver independent with task    Home Living Family/patient expects to be discharged to:: Private residence Living Arrangements: Spouse/significant other Available Help at Discharge: Family;Available 24 hours/day Type of Home: House                                  Prior Functioning/Environment Level of Independence: Independent                 OT Problem List: Decreased strength;Decreased range of motion;Impaired UE functional use      OT Treatment/Interventions:      OT Goals(Current goals can be found in the care plan section) Acute Rehab OT Goals Patient Stated Goal: To play with grandchildren OT Goal Formulation: All assessment and education complete, DC therapy Time For Goal Achievement: 07/15/20 Potential to Achieve Goals: Good  OT Frequency:     Barriers to D/C:            Co-evaluation              AM-PAC OT "6 Clicks" Daily Activity     Outcome Measure Help from another person eating meals?: A Little Help from another person taking care of personal grooming?: A Little Help from another person toileting, which includes using toliet, bedpan, or urinal?: A Little Help from another person bathing  (including washing, rinsing, drying)?: A Little Help from another person to put on and taking off regular upper body clothing?: A Lot Help from another person to put on and taking off regular lower body clothing?: A Lot 6 Click Score: 16   End of Session Nurse Communication:  (OT education complete)  Activity Tolerance: Patient tolerated treatment well Patient left: in chair  OT Visit Diagnosis: Muscle weakness (generalized) (M62.81)                Time: 9622-2979 OT Time Calculation (min): 28 min Charges:  OT General Charges $OT Visit: 1 Visit OT Evaluation $OT Eval Low Complexity: 1 Low OT Treatments $Self Care/Home Management : 8-22 mins  Osamu Olguin, OTR/L [place]  Office [phone] Pager: [phone]   Lenward Chancellor 07/15/2020, 5:45 PM

## 2020-07-15 NOTE — Discharge Instructions (Signed)

## 2020-07-16 ENCOUNTER — Encounter (HOSPITAL_COMMUNITY): Payer: Self-pay | Admitting: Orthopedic Surgery

## 2020-07-28 DIAGNOSIS — Z9889 Other specified postprocedural states: Secondary | ICD-10-CM | POA: Diagnosis not present

## 2020-08-03 DIAGNOSIS — M25611 Stiffness of right shoulder, not elsewhere classified: Secondary | ICD-10-CM | POA: Diagnosis not present

## 2020-08-03 DIAGNOSIS — Z471 Aftercare following joint replacement surgery: Secondary | ICD-10-CM | POA: Diagnosis not present

## 2020-08-03 DIAGNOSIS — Z96611 Presence of right artificial shoulder joint: Secondary | ICD-10-CM | POA: Diagnosis not present

## 2020-08-05 DIAGNOSIS — M25611 Stiffness of right shoulder, not elsewhere classified: Secondary | ICD-10-CM | POA: Diagnosis not present

## 2020-08-05 DIAGNOSIS — Z471 Aftercare following joint replacement surgery: Secondary | ICD-10-CM | POA: Diagnosis not present

## 2020-08-05 DIAGNOSIS — Z96611 Presence of right artificial shoulder joint: Secondary | ICD-10-CM | POA: Diagnosis not present

## 2020-08-09 DIAGNOSIS — Z96611 Presence of right artificial shoulder joint: Secondary | ICD-10-CM | POA: Diagnosis not present

## 2020-08-09 DIAGNOSIS — Z471 Aftercare following joint replacement surgery: Secondary | ICD-10-CM | POA: Diagnosis not present

## 2020-08-09 DIAGNOSIS — M25611 Stiffness of right shoulder, not elsewhere classified: Secondary | ICD-10-CM | POA: Diagnosis not present

## 2020-08-12 DIAGNOSIS — Z96611 Presence of right artificial shoulder joint: Secondary | ICD-10-CM | POA: Diagnosis not present

## 2020-08-12 DIAGNOSIS — Z471 Aftercare following joint replacement surgery: Secondary | ICD-10-CM | POA: Diagnosis not present

## 2020-08-12 DIAGNOSIS — M25611 Stiffness of right shoulder, not elsewhere classified: Secondary | ICD-10-CM | POA: Diagnosis not present

## 2020-08-17 DIAGNOSIS — Z471 Aftercare following joint replacement surgery: Secondary | ICD-10-CM | POA: Diagnosis not present

## 2020-08-17 DIAGNOSIS — M25611 Stiffness of right shoulder, not elsewhere classified: Secondary | ICD-10-CM | POA: Diagnosis not present

## 2020-08-17 DIAGNOSIS — Z96611 Presence of right artificial shoulder joint: Secondary | ICD-10-CM | POA: Diagnosis not present

## 2020-08-19 DIAGNOSIS — Z96611 Presence of right artificial shoulder joint: Secondary | ICD-10-CM | POA: Diagnosis not present

## 2020-08-19 DIAGNOSIS — Z471 Aftercare following joint replacement surgery: Secondary | ICD-10-CM | POA: Diagnosis not present

## 2020-08-19 DIAGNOSIS — M25611 Stiffness of right shoulder, not elsewhere classified: Secondary | ICD-10-CM | POA: Diagnosis not present

## 2020-08-25 DIAGNOSIS — Z471 Aftercare following joint replacement surgery: Secondary | ICD-10-CM | POA: Diagnosis not present

## 2020-08-25 DIAGNOSIS — M25611 Stiffness of right shoulder, not elsewhere classified: Secondary | ICD-10-CM | POA: Diagnosis not present

## 2020-08-25 DIAGNOSIS — Z96611 Presence of right artificial shoulder joint: Secondary | ICD-10-CM | POA: Diagnosis not present

## 2020-08-25 DIAGNOSIS — Z9889 Other specified postprocedural states: Secondary | ICD-10-CM | POA: Diagnosis not present

## 2020-08-31 DIAGNOSIS — Z96611 Presence of right artificial shoulder joint: Secondary | ICD-10-CM | POA: Diagnosis not present

## 2020-08-31 DIAGNOSIS — M25611 Stiffness of right shoulder, not elsewhere classified: Secondary | ICD-10-CM | POA: Diagnosis not present

## 2020-08-31 DIAGNOSIS — Z471 Aftercare following joint replacement surgery: Secondary | ICD-10-CM | POA: Diagnosis not present

## 2020-09-02 DIAGNOSIS — M25611 Stiffness of right shoulder, not elsewhere classified: Secondary | ICD-10-CM | POA: Diagnosis not present

## 2020-09-02 DIAGNOSIS — Z96611 Presence of right artificial shoulder joint: Secondary | ICD-10-CM | POA: Diagnosis not present

## 2020-09-02 DIAGNOSIS — Z471 Aftercare following joint replacement surgery: Secondary | ICD-10-CM | POA: Diagnosis not present

## 2020-09-06 DIAGNOSIS — M25611 Stiffness of right shoulder, not elsewhere classified: Secondary | ICD-10-CM | POA: Diagnosis not present

## 2020-09-06 DIAGNOSIS — Z96611 Presence of right artificial shoulder joint: Secondary | ICD-10-CM | POA: Diagnosis not present

## 2020-09-06 DIAGNOSIS — Z471 Aftercare following joint replacement surgery: Secondary | ICD-10-CM | POA: Diagnosis not present

## 2020-09-09 DIAGNOSIS — M25611 Stiffness of right shoulder, not elsewhere classified: Secondary | ICD-10-CM | POA: Diagnosis not present

## 2020-09-09 DIAGNOSIS — Z96611 Presence of right artificial shoulder joint: Secondary | ICD-10-CM | POA: Diagnosis not present

## 2020-09-09 DIAGNOSIS — Z471 Aftercare following joint replacement surgery: Secondary | ICD-10-CM | POA: Diagnosis not present

## 2020-09-13 DIAGNOSIS — M25611 Stiffness of right shoulder, not elsewhere classified: Secondary | ICD-10-CM | POA: Diagnosis not present

## 2020-09-13 DIAGNOSIS — Z471 Aftercare following joint replacement surgery: Secondary | ICD-10-CM | POA: Diagnosis not present

## 2020-09-13 DIAGNOSIS — Z96611 Presence of right artificial shoulder joint: Secondary | ICD-10-CM | POA: Diagnosis not present

## 2020-09-16 DIAGNOSIS — Z471 Aftercare following joint replacement surgery: Secondary | ICD-10-CM | POA: Diagnosis not present

## 2020-09-16 DIAGNOSIS — M25611 Stiffness of right shoulder, not elsewhere classified: Secondary | ICD-10-CM | POA: Diagnosis not present

## 2020-09-16 DIAGNOSIS — Z96611 Presence of right artificial shoulder joint: Secondary | ICD-10-CM | POA: Diagnosis not present

## 2020-09-21 DIAGNOSIS — M25611 Stiffness of right shoulder, not elsewhere classified: Secondary | ICD-10-CM | POA: Diagnosis not present

## 2020-09-21 DIAGNOSIS — Z96611 Presence of right artificial shoulder joint: Secondary | ICD-10-CM | POA: Diagnosis not present

## 2020-09-21 DIAGNOSIS — Z471 Aftercare following joint replacement surgery: Secondary | ICD-10-CM | POA: Diagnosis not present

## 2020-09-29 DIAGNOSIS — M25611 Stiffness of right shoulder, not elsewhere classified: Secondary | ICD-10-CM | POA: Diagnosis not present

## 2020-09-29 DIAGNOSIS — Z96611 Presence of right artificial shoulder joint: Secondary | ICD-10-CM | POA: Diagnosis not present

## 2020-09-29 DIAGNOSIS — Z471 Aftercare following joint replacement surgery: Secondary | ICD-10-CM | POA: Diagnosis not present

## 2020-10-04 DIAGNOSIS — Z471 Aftercare following joint replacement surgery: Secondary | ICD-10-CM | POA: Diagnosis not present

## 2020-10-04 DIAGNOSIS — Z96611 Presence of right artificial shoulder joint: Secondary | ICD-10-CM | POA: Diagnosis not present

## 2020-10-04 DIAGNOSIS — M25611 Stiffness of right shoulder, not elsewhere classified: Secondary | ICD-10-CM | POA: Diagnosis not present

## 2020-10-06 DIAGNOSIS — Z9889 Other specified postprocedural states: Secondary | ICD-10-CM | POA: Diagnosis not present

## 2020-10-06 DIAGNOSIS — Z96611 Presence of right artificial shoulder joint: Secondary | ICD-10-CM | POA: Diagnosis not present

## 2020-10-11 DIAGNOSIS — Z471 Aftercare following joint replacement surgery: Secondary | ICD-10-CM | POA: Diagnosis not present

## 2020-10-11 DIAGNOSIS — M25611 Stiffness of right shoulder, not elsewhere classified: Secondary | ICD-10-CM | POA: Diagnosis not present

## 2020-10-11 DIAGNOSIS — Z96611 Presence of right artificial shoulder joint: Secondary | ICD-10-CM | POA: Diagnosis not present

## 2020-10-15 DIAGNOSIS — Z471 Aftercare following joint replacement surgery: Secondary | ICD-10-CM | POA: Diagnosis not present

## 2020-10-15 DIAGNOSIS — Z96611 Presence of right artificial shoulder joint: Secondary | ICD-10-CM | POA: Diagnosis not present

## 2020-10-15 DIAGNOSIS — M25611 Stiffness of right shoulder, not elsewhere classified: Secondary | ICD-10-CM | POA: Diagnosis not present

## 2020-10-19 DIAGNOSIS — Z96611 Presence of right artificial shoulder joint: Secondary | ICD-10-CM | POA: Diagnosis not present

## 2020-10-19 DIAGNOSIS — M25611 Stiffness of right shoulder, not elsewhere classified: Secondary | ICD-10-CM | POA: Diagnosis not present

## 2020-10-19 DIAGNOSIS — Z471 Aftercare following joint replacement surgery: Secondary | ICD-10-CM | POA: Diagnosis not present

## 2020-10-25 DIAGNOSIS — Z96611 Presence of right artificial shoulder joint: Secondary | ICD-10-CM | POA: Diagnosis not present

## 2020-10-25 DIAGNOSIS — M25611 Stiffness of right shoulder, not elsewhere classified: Secondary | ICD-10-CM | POA: Diagnosis not present

## 2020-10-25 DIAGNOSIS — Z471 Aftercare following joint replacement surgery: Secondary | ICD-10-CM | POA: Diagnosis not present

## 2020-10-29 DIAGNOSIS — M25611 Stiffness of right shoulder, not elsewhere classified: Secondary | ICD-10-CM | POA: Diagnosis not present

## 2020-10-29 DIAGNOSIS — Z471 Aftercare following joint replacement surgery: Secondary | ICD-10-CM | POA: Diagnosis not present

## 2020-10-29 DIAGNOSIS — Z96611 Presence of right artificial shoulder joint: Secondary | ICD-10-CM | POA: Diagnosis not present

## 2020-11-02 DIAGNOSIS — M25611 Stiffness of right shoulder, not elsewhere classified: Secondary | ICD-10-CM | POA: Diagnosis not present

## 2020-11-02 DIAGNOSIS — Z96611 Presence of right artificial shoulder joint: Secondary | ICD-10-CM | POA: Diagnosis not present

## 2020-11-02 DIAGNOSIS — Z471 Aftercare following joint replacement surgery: Secondary | ICD-10-CM | POA: Diagnosis not present

## 2020-11-14 ENCOUNTER — Other Ambulatory Visit: Payer: Self-pay | Admitting: Family Medicine

## 2020-11-14 DIAGNOSIS — E7800 Pure hypercholesterolemia, unspecified: Secondary | ICD-10-CM

## 2020-11-14 DIAGNOSIS — E039 Hypothyroidism, unspecified: Secondary | ICD-10-CM

## 2020-11-19 DIAGNOSIS — Z471 Aftercare following joint replacement surgery: Secondary | ICD-10-CM | POA: Diagnosis not present

## 2020-11-19 DIAGNOSIS — Z96611 Presence of right artificial shoulder joint: Secondary | ICD-10-CM | POA: Diagnosis not present

## 2020-11-19 DIAGNOSIS — M25611 Stiffness of right shoulder, not elsewhere classified: Secondary | ICD-10-CM | POA: Diagnosis not present

## 2020-11-22 ENCOUNTER — Other Ambulatory Visit (INDEPENDENT_AMBULATORY_CARE_PROVIDER_SITE_OTHER): Payer: PPO

## 2020-11-22 ENCOUNTER — Other Ambulatory Visit: Payer: Self-pay

## 2020-11-22 DIAGNOSIS — E7800 Pure hypercholesterolemia, unspecified: Secondary | ICD-10-CM

## 2020-11-22 DIAGNOSIS — E039 Hypothyroidism, unspecified: Secondary | ICD-10-CM

## 2020-11-22 LAB — COMPREHENSIVE METABOLIC PANEL
ALT: 12 U/L (ref 0–35)
AST: 14 U/L (ref 0–37)
Albumin: 4.1 g/dL (ref 3.5–5.2)
Alkaline Phosphatase: 65 U/L (ref 39–117)
BUN: 14 mg/dL (ref 6–23)
CO2: 30 mEq/L (ref 19–32)
Calcium: 9.3 mg/dL (ref 8.4–10.5)
Chloride: 102 mEq/L (ref 96–112)
Creatinine, Ser: 0.85 mg/dL (ref 0.40–1.20)
GFR: 69.95 mL/min (ref >60.00)
Glucose, Bld: 97 mg/dL (ref 70–99)
Potassium: 4.8 mEq/L (ref 3.5–5.1)
Sodium: 139 mEq/L (ref 135–145)
Total Bilirubin: 0.5 mg/dL (ref 0.2–1.2)
Total Protein: 6.5 g/dL (ref 6.0–8.3)

## 2020-11-22 LAB — CBC WITH DIFFERENTIAL/PLATELET
Basophils Absolute: 0.1 10*3/uL (ref 0.0–0.1)
Basophils Relative: 0.8 % (ref 0.0–3.0)
Eosinophils Absolute: 0.2 10*3/uL (ref 0.0–0.7)
Eosinophils Relative: 2.2 % (ref 0.0–5.0)
HCT: 42.9 % (ref 36.0–46.0)
Hemoglobin: 14.4 g/dL (ref 12.0–15.0)
Lymphocytes Relative: 19.9 % (ref 12.0–46.0)
Lymphs Abs: 1.7 10*3/uL (ref 0.7–4.0)
MCHC: 33.6 g/dL (ref 30.0–36.0)
MCV: 90.6 fl (ref 78.0–100.0)
Monocytes Absolute: 0.6 10*3/uL (ref 0.1–1.0)
Monocytes Relative: 6.6 % (ref 3.0–12.0)
Neutro Abs: 6.1 10*3/uL (ref 1.4–7.7)
Neutrophils Relative %: 70.5 % (ref 43.0–77.0)
Platelets: 291 10*3/uL (ref 150.0–400.0)
RBC: 4.73 Mil/uL (ref 3.87–5.11)
RDW: 14.4 % (ref 11.5–15.5)
WBC: 8.6 10*3/uL (ref 4.0–10.5)

## 2020-11-22 LAB — LIPID PANEL
Cholesterol: 253 mg/dL — ABNORMAL HIGH (ref 0–200)
HDL: 39.8 mg/dL (ref >39.00)
LDL Cholesterol: 176 mg/dL — ABNORMAL HIGH (ref 0–99)
NonHDL: 213.34
Total CHOL/HDL Ratio: 6
Triglycerides: 188 mg/dL — ABNORMAL HIGH (ref 0.0–149.0)
VLDL: 37.6 mg/dL (ref 0.0–40.0)

## 2020-11-22 LAB — TSH: TSH: 0.34 u[IU]/mL — ABNORMAL LOW (ref 0.35–4.50)

## 2020-11-23 DIAGNOSIS — Z471 Aftercare following joint replacement surgery: Secondary | ICD-10-CM | POA: Diagnosis not present

## 2020-11-23 DIAGNOSIS — Z96611 Presence of right artificial shoulder joint: Secondary | ICD-10-CM | POA: Diagnosis not present

## 2020-11-23 DIAGNOSIS — M25611 Stiffness of right shoulder, not elsewhere classified: Secondary | ICD-10-CM | POA: Diagnosis not present

## 2020-11-24 ENCOUNTER — Other Ambulatory Visit: Payer: Self-pay

## 2020-11-24 ENCOUNTER — Ambulatory Visit (INDEPENDENT_AMBULATORY_CARE_PROVIDER_SITE_OTHER): Payer: PPO | Admitting: Family Medicine

## 2020-11-24 ENCOUNTER — Encounter: Payer: Self-pay | Admitting: Family Medicine

## 2020-11-24 VITALS — BP 140/80 | HR 98 | Temp 98.7°F | Ht 63.5 in | Wt 165.2 lb

## 2020-11-24 DIAGNOSIS — G8929 Other chronic pain: Secondary | ICD-10-CM | POA: Diagnosis not present

## 2020-11-24 DIAGNOSIS — M7741 Metatarsalgia, right foot: Secondary | ICD-10-CM | POA: Diagnosis not present

## 2020-11-24 DIAGNOSIS — M79673 Pain in unspecified foot: Secondary | ICD-10-CM

## 2020-11-24 DIAGNOSIS — M21622 Bunionette of left foot: Secondary | ICD-10-CM

## 2020-11-24 DIAGNOSIS — M7742 Metatarsalgia, left foot: Secondary | ICD-10-CM | POA: Diagnosis not present

## 2020-11-24 DIAGNOSIS — M21612 Bunion of left foot: Secondary | ICD-10-CM | POA: Diagnosis not present

## 2020-11-24 DIAGNOSIS — T819XXS Unspecified complication of procedure, sequela: Secondary | ICD-10-CM

## 2020-11-24 DIAGNOSIS — M21611 Bunion of right foot: Secondary | ICD-10-CM | POA: Diagnosis not present

## 2020-11-24 DIAGNOSIS — M21621 Bunionette of right foot: Secondary | ICD-10-CM | POA: Diagnosis not present

## 2020-11-25 DIAGNOSIS — Z96611 Presence of right artificial shoulder joint: Secondary | ICD-10-CM | POA: Diagnosis not present

## 2020-11-25 DIAGNOSIS — Z471 Aftercare following joint replacement surgery: Secondary | ICD-10-CM | POA: Diagnosis not present

## 2020-11-25 DIAGNOSIS — M25611 Stiffness of right shoulder, not elsewhere classified: Secondary | ICD-10-CM | POA: Diagnosis not present

## 2020-11-26 ENCOUNTER — Ambulatory Visit (INDEPENDENT_AMBULATORY_CARE_PROVIDER_SITE_OTHER): Payer: PPO

## 2020-11-26 ENCOUNTER — Other Ambulatory Visit: Payer: Self-pay

## 2020-11-26 DIAGNOSIS — Z0000 Encounter for general adult medical examination without abnormal findings: Secondary | ICD-10-CM

## 2020-11-26 NOTE — Progress Notes (Signed)
Javier Gell T. Josedaniel Haye, MD, [place] at [facility] [place], [address]  Phone: [phone]  FAX: [phone]  Jaila Schellhorn - 69 y.o. female  MRN [id number]  Date of Birth: 1951/01/27  Date: 11/24/2020  PCP: Tonia Ghent, MD  Referral: Tonia Ghent, MD  Chief Complaint  Patient presents with  . Foot Orthotics    This visit occurred during the SARS-CoV-2 public health emergency.  Safety protocols were in place, including screening questions prior to the visit, additional usage of staff PPE, and extensive cleaning of exam room while observing appropriate contact time as indicated for disinfecting solutions.   Subjective:   Kim Brown is a 69 y.o. very pleasant female patient with Body mass index is 28.81 kg/m. who presents with the following:  Almost patient, and she presents with a longstanding history of foot pain and specifically forefoot pain with significant bunion and bunionette formation and diffuse metatarsalgia.  She did have 1 bunionectomy with failure and continued ongoing chronic pain.  She also has significant tenderness throughout the forefoot with additional breakdown.  She did have an older pair of orthotics crafted by one of the podiatrist, and this did well for her for a longer term  Review of Systems is noted in the HPI, as appropriate   Objective:   BP 140/80   Pulse 98   Temp 98.7 F (37.1 C) (Temporal)   Ht 5' 3.5" (1.613 m)   Wt 165 lb 4 oz (75 kg)   SpO2 97%   BMI 28.81 kg/m   Bilateral feet: Extensive forefoot breakdown and complete transverse arch collapse with drop metatarsal heads.  Very extensive bunions and bunionette formation bilaterally.  Diffuse hammertoes.  Tender to palpation at calluses on the plantar aspect of the foot in multiple locations.  Longitudinal arch is grossly preserved as well as no significant pain at the ankle and  hindfoot.  Radiology: No results found.  Assessment and Plan:     ICD-10-CM   1. Metatarsalgia of both feet  M77.41    M77.42   2. Bilateral bunions  M21.611    M21.612   3. Tailor's bunionette, bilateral  M21.621    M21.622   4. Chronic foot pain, unspecified laterality  M79.673    G89.29   5. Failed bunionectomy, sequela  T81.9XXS    Total encounter time: 40 minutes. This includes total time spent on the day of encounter.  Additional time spent in custom crafting the patient's orthotics, fitting, as well as gait analysis.  Patient was fitted for a standard, cushioned, semi-rigid orthotic.  The orthotic was heated and the patient stood on the orthotic blank positioned on the orthotic stand. The patient was positioned in subtalar neutral position and 10 degrees of ankle dorsiflexion in a weight bearing stance. After molding, a stable Fast-Tech EVA base was applied to the orthotic blank.   The blank was ground to a stable position for weight bearing. Size: 8 men's Base: F2 green  posting: none additional orthotic padding: medium MT pad placed  No orders of the defined types were placed in this encounter.  Medications Discontinued During This Encounter  Medication Reason  . tiZANidine (ZANAFLEX) 4 MG tablet Completed Course  . HYDROcodone-acetaminophen (NORCO/VICODIN) 5-325 MG tablet Completed Course   No orders of the defined types were placed in this encounter.   Follow-up: No follow-ups on file.  Signed,  Maud Deed. Nasser Ku,  MD   Outpatient Encounter Medications as of 11/24/2020  Medication Sig  . Ascorbic Acid (VITAMIN C) 1000 MG tablet Take 1,000 mg by mouth 2 (two) times daily.  Marland Kitchen b complex vitamins tablet Take 1 tablet by mouth 3 (three) times a week.  . beta carotene 10000 UNIT capsule Take 10,000 Units by mouth daily.  . Calcium Carbonate-Vitamin D (CALTRATE 600+D PO) Take by mouth daily.  . Cholecalciferol (VITAMIN D) 2000 UNITS CAPS Take 1 capsule by mouth 2  (two) times daily.  Marland Kitchen DHEA 25 MG CAPS Take 25 mg by mouth daily.  . fish oil-omega-3 fatty acids 1000 MG capsule Take 1 g by mouth daily.  . Magnesium 250 MG TABS Take by mouth daily.  . Melatonin 10 MG TABS Take 10 mg by mouth at bedtime.   . NON FORMULARY Progesterone pellet/ estrogen  per outside clinic.  . progesterone (PROMETRIUM) 100 MG capsule Take 300 mg by mouth at bedtime.   Marland Kitchen thyroid (ARMOUR) 60 MG tablet Take 60-90 mg by mouth See admin instructions. 90mg  in AM and 60mg  in PM  . zinc gluconate 50 MG tablet Take 50 mg by mouth daily.  . [DISCONTINUED] HYDROcodone-acetaminophen (NORCO/VICODIN) 5-325 MG tablet Take 1-2 tablets by mouth every 4 (four) hours as needed for moderate pain.  . [DISCONTINUED] tiZANidine (ZANAFLEX) 4 MG tablet Take 1 tablet (4 mg total) by mouth every 8 (eight) hours as needed for muscle spasms.   No facility-administered encounter medications on file as of 11/24/2020.

## 2020-11-26 NOTE — Progress Notes (Signed)
Subjective:   Kim Brown is a 69 y.o. female who presents for Medicare Annual (Subsequent) preventive examination.  Review of Systems: N/A      I connected with the patient today by telephone and verified that I am speaking with the correct person using two identifiers. Location patient: home Location nurse: work Persons participating in the telephone visit: patient, nurse.   I discussed the limitations, risks, security and privacy concerns of performing an evaluation and management service by telephone and the availability of in person appointments. I also discussed with the patient that there may be a patient responsible charge related to this service. The patient expressed understanding and verbally consented to this telephonic visit.        Cardiac Risk Factors include: advanced age (>4men, >53 women);Other (see comment), Risk factor comments: hypercholesterolemia     Objective:    Today's Vitals   11/26/20 1100  PainSc: 5    There is no height or weight on file to calculate BMI.  Advanced Directives 11/26/2020 07/12/2020 09/05/2019 04/24/2018 05/17/2017  Does Patient Have a Medical Advance Directive? Yes Yes No Yes Yes  Type of Paramedic of [place];Living will [place];Living will - [place];Living will [place];Living will  Copy of [place] in Chart? No - copy requested - - No - copy requested -  Would patient like information on creating a medical advance directive? - - No - Patient declined - -    Current Medications (verified) Outpatient Encounter Medications as of 11/26/2020  Medication Sig  . Ascorbic Acid (VITAMIN C) 1000 MG tablet Take 1,000 mg by mouth 2 (two) times daily.  Marland Kitchen b complex vitamins tablet Take 1 tablet by mouth 3 (three) times a week.  . beta carotene 10000 UNIT capsule Take 10,000 Units by mouth daily.  . Calcium Carbonate-Vitamin D (CALTRATE  600+D PO) Take by mouth daily.  . Cholecalciferol (VITAMIN D) 2000 UNITS CAPS Take 1 capsule by mouth 2 (two) times daily.  Marland Kitchen DHEA 25 MG CAPS Take 25 mg by mouth daily.  . fish oil-omega-3 fatty acids 1000 MG capsule Take 1 g by mouth daily.  . Magnesium 250 MG TABS Take by mouth daily.  . Melatonin 10 MG TABS Take 10 mg by mouth at bedtime.   . NON FORMULARY Progesterone pellet/ estrogen  per outside clinic.  . progesterone (PROMETRIUM) 100 MG capsule Take 300 mg by mouth at bedtime.   Marland Kitchen thyroid (ARMOUR) 60 MG tablet Take 60-90 mg by mouth See admin instructions. 90mg  in AM and 60mg  in PM  . zinc gluconate 50 MG tablet Take 50 mg by mouth daily.   No facility-administered encounter medications on file as of 11/26/2020.    Allergies (verified) Albuterol and Lovastatin   History: Past Medical History:  Diagnosis Date  . Allergy   . Anemia   . Arthritis    feet and hands , neck, shoulder  . Dysrhythmia 2006   atrial flutter  . Exogenous obesity   . Heel spur   . Hyperlipidemia   . Hypertension   . Hypothyroid   . Irregular heart beats   . Palpitations   . Prolapsed, anus    uses mag oxide powder nightly   . RSD (reflex sympathetic dystrophy)    after surgery on R foot  . Stress incontinence, female    Past Surgical History:  Procedure Laterality Date  . ABDOMINAL HYSTERECTOMY  2005  .  BUNIONECTOMY    . COLONOSCOPY    . DILATION AND CURETTAGE OF UTERUS    . REVERSE SHOULDER ARTHROPLASTY Right 07/15/2020   Procedure: REVERSE SHOULDER ARTHROPLASTY;  Surgeon: Tania Ade, MD;  Location: [facility];  Service: Orthopedics;  Laterality: Right;   Family History  Problem Relation Age of Onset  . Arthritis Mother   . Hypertension Mother   . Colon polyps Mother   . Heart disease Father   . Hypertension Father   . Arthritis Father   . Stroke Father   . Heart failure Brother   . Diabetes Brother   . Pancreatic cancer Brother   . Colon cancer Neg Hx   . Breast cancer Neg  Hx   . Esophageal cancer Neg Hx   . Rectal cancer Neg Hx   . Stomach cancer Neg Hx    Social History   Socioeconomic History  . Marital status: Married    Spouse name: Not on file  . Number of children: Not on file  . Years of education: Not on file  . Highest education level: Not on file  Occupational History  . Not on file  Tobacco Use  . Smoking status: Never Smoker  . Smokeless tobacco: Never Used  Vaping Use  . Vaping Use: Never used  Substance and Sexual Activity  . Alcohol use: No  . Drug use: No  . Sexual activity: Yes  Other Topics Concern  . Not on file  Social History Narrative   Married 1972   2 daughters Gari [place] Bells is one) and 1 son   Occ subs at [employer]   Social Determinants of Radio broadcast assistant Strain: Low Risk   . Difficulty of Paying Living Expenses: Not hard at all  Food Insecurity: No Food Insecurity  . Worried About Charity fundraiser in the Last Year: Never true  . Ran Out of Food in the Last Year: Never true  Transportation Needs: No Transportation Needs  . Lack of Transportation (Medical): No  . Lack of Transportation (Non-Medical): No  Physical Activity: Insufficiently Active  . Days of Exercise per Week: 7 days  . Minutes of Exercise per Session: 10 min  Stress: No Stress Concern Present  . Feeling of Stress : Not at all  Social Connections: Not on file    Tobacco Counseling Counseling given: Not Answered   Clinical Intake:  Pre-visit preparation completed: Yes  Pain : 0-10 Pain Score: 5  Pain Type: Chronic pain Pain Location: Shoulder Pain Orientation: Right Pain Descriptors / Indicators: Aching Pain Onset: More than a month ago Pain Frequency: Intermittent     Nutritional Risks: None Diabetes: No  How often do you need to have someone help you when you read instructions, pamphlets, or other written materials from your doctor or pharmacy?: 1 - Never What is the last grade level you completed  in school?: Associates degree  Diabetic: No Nutrition Risk Assessment:  Has the patient had any N/V/D within the last 2 months?  No  Does the patient have any non-healing wounds?  No  Has the patient had any unintentional weight loss or weight gain?  No   Diabetes:  Is the patient diabetic?  No  If diabetic, was a CBG obtained today?  N/A Did the patient bring in their glucometer from home?  N/A How often do you monitor your CBG's? N/A.   Financial Strains and Diabetes Management:  Are you having any financial strains with the device,  your supplies or your medication? N/A.  Does the patient want to be seen by Chronic Care Management for management of their diabetes?  N/A Would the patient like to be referred to a Nutritionist or for Diabetic Management?  N/A    Interpreter Needed?: No  Information entered by :: CJohnson, LPN   Activities of Daily Living In your present state of health, do you have any difficulty performing the following activities: 11/26/2020 07/12/2020  Hearing? N N  Vision? N N  Difficulty concentrating or making decisions? N N  Walking or climbing stairs? N N  Dressing or bathing? N N  Doing errands, shopping? N N  Preparing Food and eating ? N -  Using the Toilet? N -  In the past six months, have you accidently leaked urine? N -  Do you have problems with loss of bowel control? N -  Managing your Medications? N -  Managing your Finances? N -  Housekeeping or managing your Housekeeping? N -  Some recent data might be hidden    Patient Care Team: Tonia Ghent, MD as PCP - General (Family Medicine)  Indicate any recent Medical Services you may have received from other than [facility] providers in the past year (date may be approximate).     Assessment:   This is a routine wellness examination for [place].  Hearing/Vision screen  Hearing Screening   125Hz  250Hz  500Hz  1000Hz  2000Hz  3000Hz  4000Hz  6000Hz  8000Hz   Right ear:           Left ear:            Vision Screening Comments: Patient gets annual eye exams  Dietary issues and exercise activities discussed: Current Exercise Habits: Home exercise routine, Type of exercise: stretching, Time (Minutes): 10, Frequency (Times/Week): 7, Weekly Exercise (Minutes/Week): 70, Intensity: Moderate, Exercise limited by: None identified  Goals    . Increase physical activity     Starting 04/24/2018, I will continue to exercise for 20-30 minutes 5 days per week.     . Patient Stated     09/05/2019, Patient wants to maintain her current status and not change anything right now    . Patient Stated     11/26/2020, I will continue to do stretching exercises everyday for about 10 minutes.      Depression Screen PHQ 2/9 Scores 11/26/2020 09/05/2019 04/24/2018  PHQ - 2 Score 0 0 0  PHQ- 9 Score 0 0 0    Fall Risk Fall Risk  11/26/2020 07/13/2020 09/05/2019 04/24/2018  Falls in the past year? 0 0 0 No  Comment - Emmi Telephone Survey: data to providers prior to load - -  Number falls in past yr: 0 - 0 -  Injury with Fall? 0 - - -  Risk for fall due to : Medication side effect - - -  Follow up Falls evaluation completed;Falls prevention discussed - Falls evaluation completed;Falls prevention discussed -    FALL RISK PREVENTION PERTAINING TO THE HOME:  Any stairs in or around the home? Yes  If so, are there any without handrails? No  Home free of loose throw rugs in walkways, pet beds, electrical cords, etc? Yes  Adequate lighting in your home to reduce risk of falls? Yes   ASSISTIVE DEVICES UTILIZED TO PREVENT FALLS:  Life alert? No  Use of a cane, walker or w/c? No  Grab bars in the bathroom? Yes  Shower chair or bench in shower? No  Elevated toilet seat or a handicapped  toilet? No   TIMED UP AND GO:  Was the test performed? N/A, telephone visit.   Cognitive Function: MMSE - Mini Mental State Exam 11/26/2020 09/05/2019 04/24/2018  Orientation to time 5 5 5   Orientation to Place 5 5 5    Registration 3 3 3   Attention/ Calculation 5 5 0  Recall 3 3 3   Language- name 2 objects - - 0  Language- repeat 1 1 1   Language- follow 3 step command - - 3  Language- read & follow direction - - 0  Write a sentence - - 0  Copy design - - 0  Total score - - 20  Mini Cog  Mini-Cog screen was completed. Maximum score is 22. A value of 0 denotes this part of the MMSE was not completed or the patient failed this part of the Mini-Cog screening.       Immunizations Immunization History  Administered Date(s) Administered  . Influenza Split 11/10/2011  . Influenza, Seasonal, Injecte, Preservative Fre 11/18/2012  . Influenza,inj,Quad PF,6+ Mos 09/15/2019  . Pneumococcal Conjugate-13 03/20/2017  . Pneumococcal Polysaccharide-23 04/24/2018  . Tdap 12/18/2005    TDAP status: Due, Education has been provided regarding the importance of this vaccine. Advised may receive this vaccine at local pharmacy or Health Dept. Aware to provide a copy of the vaccination record if obtained from local pharmacy or Health Dept. Verbalized acceptance and understanding.  Flu Vaccine status: Declined, Education has been provided regarding the importance of this vaccine but patient still declined. Advised may receive this vaccine at local pharmacy or Health Dept. Aware to provide a copy of the vaccination record if obtained from local pharmacy or Health Dept. Verbalized acceptance and understanding.  Pneumococcal vaccine status: Up to date  Covid-19 vaccine status: Declined, Education has been provided regarding the importance of this vaccine but patient still declined. Advised may receive this vaccine at local pharmacy or Health Dept.or vaccine clinic. Aware to provide a copy of the vaccination record if obtained from local pharmacy or Health Dept. Verbalized acceptance and understanding.  Qualifies for Shingles Vaccine? Yes   Zostavax completed No   Shingrix Completed?: No.    Education has been provided  regarding the importance of this vaccine. Patient has been advised to call insurance company to determine out of pocket expense if they have not yet received this vaccine. Advised may also receive vaccine at local pharmacy or Health Dept. Verbalized acceptance and understanding.  Screening Tests Health Maintenance  Topic Date Due  . INFLUENZA VACCINE  03/17/2021 (Originally 07/18/2020)  . COVID-19 Vaccine (1) 12/12/2021 (Originally 07/26/1963)  . TETANUS/TDAP  11/26/2024 (Originally 12/19/2015)  . MAMMOGRAM  02/05/2022  . COLONOSCOPY  06/01/2027  . DEXA SCAN  Completed  . Hepatitis C Screening  Completed  . PNA vac Low Risk Adult  Completed    Health Maintenance  There are no preventive care reminders to display for this patient.  Colorectal cancer screening: Type of screening: Colonoscopy. Completed 05/31/2017. Repeat every 10 years  Mammogram status: Completed 02/06/2020. Repeat every year  Bone Density status: Completed 04/10/2017. Results reflect: Bone density results: NORMAL. Repeat every 2-5 years.  Lung Cancer Screening: (Low Dose CT Chest recommended if Age 21-80 years, 30 pack-year currently smoking OR have quit w/in 15years.) does not qualify.    Additional Screening:  Hepatitis C Screening: does qualify; Completed 12/18/2006  Vision Screening: Recommended annual ophthalmology exams for early detection of glaucoma and other disorders of the eye. Is the patient up to date with  their annual eye exam?  Yes  Who is the provider or what is the name of the office in which the patient attends annual eye exams? Dr. Lysbeth Penner If pt is not established with a provider, would they like to be referred to a provider to establish care? No .   Dental Screening: Recommended annual dental exams for proper oral hygiene  Community Resource Referral / Chronic Care Management: CRR required this visit?  No   CCM required this visit?  No      Plan:     I have personally reviewed and noted  the following in the patient's chart:   . Medical and social history . Use of alcohol, tobacco or illicit drugs  . Current medications and supplements . Functional ability and status . Nutritional status . Physical activity . Advanced directives . List of other physicians . Hospitalizations, surgeries, and ER visits in previous 12 months . Vitals . Screenings to include cognitive, depression, and falls . Referrals and appointments  In addition, I have reviewed and discussed with patient certain preventive protocols, quality metrics, and best practice recommendations. A written personalized care plan for preventive services as well as general preventive health recommendations were provided to patient.   Due to this being a telephonic visit, the after visit summary with patients personalized plan was offered to patient via office or my-chart. Patient preferred to pick up at office at next visit or via mychart.   Andrez Grime, LPN   [id number]

## 2020-11-26 NOTE — Patient Instructions (Signed)
Kim Brown , Thank you for taking time to come for your Medicare Wellness Visit. I appreciate your ongoing commitment to your health goals. Please review the following plan we discussed and let me know if I can assist you in the future.   Screening recommendations/referrals: Colonoscopy: Up to date, completed 05/31/2017, due 05/2027 Mammogram: Up to date, completed 02/06/2020, due 01/2021 Bone Density: Up to date, completed 04/10/2017, due 2-5 years  Recommended yearly ophthalmology/optometry visit for glaucoma screening and checkup Recommended yearly dental visit for hygiene and checkup  Vaccinations: Influenza vaccine: declined Pneumococcal vaccine: Completed series Tdap vaccine: decline-insurance Shingles vaccine: due, check with your insurance regarding coverage if interested    Covid-19:declined  Advanced directives: Please bring a copy of your POA (Power of Attorney) and/or Living Will to your next appointment.   Conditions/risks identified: hypercholesterolemia  Next appointment: Follow up in one year for your annual wellness visit    Preventive Care 32 Years and Older, Female Preventive care refers to lifestyle choices and visits with your health care provider that can promote health and wellness. What does preventive care include?  A yearly physical exam. This is also called an annual well check.  Dental exams once or twice a year.  Routine eye exams. Ask your health care provider how often you should have your eyes checked.  Personal lifestyle choices, including:  Daily care of your teeth and gums.  Regular physical activity.  Eating a healthy diet.  Avoiding tobacco and drug use.  Limiting alcohol use.  Practicing safe sex.  Taking low-dose aspirin every day.  Taking vitamin and mineral supplements as recommended by your health care provider. What happens during an annual well check? The services and screenings done by your health care provider during your  annual well check will depend on your age, overall health, lifestyle risk factors, and family history of disease. Counseling  Your health care provider may ask you questions about your:  Alcohol use.  Tobacco use.  Drug use.  Emotional well-being.  Home and relationship well-being.  Sexual activity.  Eating habits.  History of falls.  Memory and ability to understand (cognition).  Work and work Statistician.  Reproductive health. Screening  You may have the following tests or measurements:  Height, weight, and BMI.  Blood pressure.  Lipid and cholesterol levels. These may be checked every 5 years, or more frequently if you are over 41 years old.  Skin check.  Lung cancer screening. You may have this screening every year starting at age 31 if you have a 30-pack-year history of smoking and currently smoke or have quit within the past 15 years.  Fecal occult blood test (FOBT) of the stool. You may have this test every year starting at age 49.  Flexible sigmoidoscopy or colonoscopy. You may have a sigmoidoscopy every 5 years or a colonoscopy every 10 years starting at age 56.  Hepatitis C blood test.  Hepatitis B blood test.  Sexually transmitted disease (STD) testing.  Diabetes screening. This is done by checking your blood sugar (glucose) after you have not eaten for a while (fasting). You may have this done every 1-3 years.  Bone density scan. This is done to screen for osteoporosis. You may have this done starting at age 56.  Mammogram. This may be done every 1-2 years. Talk to your health care provider about how often you should have regular mammograms. Talk with your health care provider about your test results, treatment options, and if necessary, the need  for more tests. Vaccines  Your health care provider may recommend certain vaccines, such as:  Influenza vaccine. This is recommended every year.  Tetanus, diphtheria, and acellular pertussis (Tdap, Td)  vaccine. You may need a Td booster every 10 years.  Zoster vaccine. You may need this after age 62.  Pneumococcal 13-valent conjugate (PCV13) vaccine. One dose is recommended after age 32.  Pneumococcal polysaccharide (PPSV23) vaccine. One dose is recommended after age 67. Talk to your health care provider about which screenings and vaccines you need and how often you need them. This information is not intended to replace advice given to you by your health care provider. Make sure you discuss any questions you have with your health care provider. Document Released: 12/31/2015 Document Revised: 08/23/2016 Document Reviewed: 10/05/2015 Elsevier Interactive Patient Education  2017 [place] Prevention in the Home Falls can cause injuries. They can happen to people of all ages. There are many things you can do to make your home safe and to help prevent falls. What can I do on the outside of my home?  Regularly fix the edges of walkways and driveways and fix any cracks.  Remove anything that might make you trip as you walk through a door, such as a raised step or threshold.  Trim any bushes or trees on the path to your home.  Use bright outdoor lighting.  Clear any walking paths of anything that might make someone trip, such as rocks or tools.  Regularly check to see if handrails are loose or broken. Make sure that both sides of any steps have handrails.  Any raised decks and porches should have guardrails on the edges.  Have any leaves, snow, or ice cleared regularly.  Use sand or salt on walking paths during winter.  Clean up any spills in your garage right away. This includes oil or grease spills. What can I do in the bathroom?  Use night lights.  Install grab bars by the toilet and in the tub and shower. Do not use towel bars as grab bars.  Use non-skid mats or decals in the tub or shower.  If you need to sit down in the shower, use a plastic, non-slip  stool.  Keep the floor dry. Clean up any water that spills on the floor as soon as it happens.  Remove soap buildup in the tub or shower regularly.  Attach bath mats securely with double-sided non-slip rug tape.  Do not have throw rugs and other things on the floor that can make you trip. What can I do in the bedroom?  Use night lights.  Make sure that you have a light by your bed that is easy to reach.  Do not use any sheets or blankets that are too big for your bed. They should not hang down onto the floor.  Have a firm chair that has side arms. You can use this for support while you get dressed.  Do not have throw rugs and other things on the floor that can make you trip. What can I do in the kitchen?  Clean up any spills right away.  Avoid walking on wet floors.  Keep items that you use a lot in easy-to-reach places.  If you need to reach something above you, use a strong step stool that has a grab bar.  Keep electrical cords out of the way.  Do not use floor polish or wax that makes floors slippery. If you must use wax, use  non-skid floor wax.  Do not have throw rugs and other things on the floor that can make you trip. What can I do with my stairs?  Do not leave any items on the stairs.  Make sure that there are handrails on both sides of the stairs and use them. Fix handrails that are broken or loose. Make sure that handrails are as long as the stairways.  Check any carpeting to make sure that it is firmly attached to the stairs. Fix any carpet that is loose or worn.  Avoid having throw rugs at the top or bottom of the stairs. If you do have throw rugs, attach them to the floor with carpet tape.  Make sure that you have a light switch at the top of the stairs and the bottom of the stairs. If you do not have them, ask someone to add them for you. What else can I do to help prevent falls?  Wear shoes that:  Do not have high heels.  Have rubber bottoms.  Are  comfortable and fit you well.  Are closed at the toe. Do not wear sandals.  If you use a stepladder:  Make sure that it is fully opened. Do not climb a closed stepladder.  Make sure that both sides of the stepladder are locked into place.  Ask someone to hold it for you, if possible.  Clearly mark and make sure that you can see:  Any grab bars or handrails.  First and last steps.  Where the edge of each step is.  Use tools that help you move around (mobility aids) if they are needed. These include:  Canes.  Walkers.  Scooters.  Crutches.  Turn on the lights when you go into a dark area. Replace any light bulbs as soon as they burn out.  Set up your furniture so you have a clear path. Avoid moving your furniture around.  If any of your floors are uneven, fix them.  If there are any pets around you, be aware of where they are.  Review your medicines with your doctor. Some medicines can make you feel dizzy. This can increase your chance of falling. Ask your doctor what other things that you can do to help prevent falls. This information is not intended to replace advice given to you by your health care provider. Make sure you discuss any questions you have with your health care provider. Document Released: 09/30/2009 Document Revised: 05/11/2016 Document Reviewed: 01/08/2015 Elsevier Interactive Patient Education  2017 [employer].

## 2020-11-26 NOTE — Progress Notes (Signed)
PCP notes:  Health Maintenance: Tdap- insurance Covid- declined Flu- declined   Abnormal Screenings: none   Patient concerns: Urinary issues- trouble emptying bladder at times  Corn on her toes   Nurse concerns: none   Next PCP appt.: 11/30/2020 @ 9:30 am

## 2020-11-29 DIAGNOSIS — Z96611 Presence of right artificial shoulder joint: Secondary | ICD-10-CM | POA: Diagnosis not present

## 2020-11-29 DIAGNOSIS — M25611 Stiffness of right shoulder, not elsewhere classified: Secondary | ICD-10-CM | POA: Diagnosis not present

## 2020-11-29 DIAGNOSIS — Z471 Aftercare following joint replacement surgery: Secondary | ICD-10-CM | POA: Diagnosis not present

## 2020-11-30 ENCOUNTER — Other Ambulatory Visit: Payer: Self-pay

## 2020-11-30 ENCOUNTER — Ambulatory Visit (INDEPENDENT_AMBULATORY_CARE_PROVIDER_SITE_OTHER): Payer: PPO | Admitting: Family Medicine

## 2020-11-30 ENCOUNTER — Encounter: Payer: Self-pay | Admitting: Family Medicine

## 2020-11-30 VITALS — BP 130/72 | HR 93 | Temp 97.5°F | Ht 64.5 in | Wt 163.2 lb

## 2020-11-30 DIAGNOSIS — R399 Unspecified symptoms and signs involving the genitourinary system: Secondary | ICD-10-CM | POA: Diagnosis not present

## 2020-11-30 DIAGNOSIS — Z79890 Hormone replacement therapy (postmenopausal): Secondary | ICD-10-CM | POA: Diagnosis not present

## 2020-11-30 DIAGNOSIS — M25519 Pain in unspecified shoulder: Secondary | ICD-10-CM

## 2020-11-30 DIAGNOSIS — E039 Hypothyroidism, unspecified: Secondary | ICD-10-CM

## 2020-11-30 DIAGNOSIS — Z0000 Encounter for general adult medical examination without abnormal findings: Secondary | ICD-10-CM

## 2020-11-30 DIAGNOSIS — Z7189 Other specified counseling: Secondary | ICD-10-CM

## 2020-11-30 DIAGNOSIS — Z978 Presence of other specified devices: Secondary | ICD-10-CM

## 2020-11-30 NOTE — Progress Notes (Signed)
This visit occurred during the SARS-CoV-2 public health emergency.  Safety protocols were in place, including screening questions prior to the visit, additional usage of staff PPE, and extensive cleaning of exam room while observing appropriate contact time as indicated for disinfecting solutions.  S/p shoulder surgery.  R ULNAR ROM is getting better.  Still in PT.  Some pain after PT but is better than prev.     TSH slightly low.  Taking 150mg  armour thyroid daily.  Per outside clinic.  No neck mass, no lumps.  Advised to either recheck her TSH here or at outside clinic.  HRT per outside clinic.  I'll defer.  She is on estrogen per outside clinic.  Cancer risk, etc d/w pt.  I'll defer to patient and outside clinic.  I asked her specifically to find out about the plan to get off estrogen replacement gradually.  Urinary issues- trouble emptying bladder at times. She has trouble with stream stopping inappropriately.  Noted more in the AMs.  Initial stream is okay.  No burning with urination.  She is able to void eventually.  She has some leakage with laughing.  D/w pt about kegel exercises.  She can update me as needed.  She is not at the point of wanting surgical intervention.  Lesion on her medial L 5th toe.  Looks like a callous.  D/w pt about toe spacer use  Tetanus can be done at pharmacy. Shingles d/w pt.   Flu shot 2020.  encouraged.  Declined. Pap not due.  covid vaccine encouraged.  Declined. DXA 2018, she'll call about scheduling.  Colonoscopy 2018 Mammogram 2021 Has a living will. Husband is designated if patient were incapacitated.Then daughter [place] designated if husband is incapacitated.   Diet and exercise d/w pt.    Meds, vitals, and allergies reviewed.   ROS: Per HPI unless specifically indicated in ROS section   GEN: nad, alert and oriented HEENT: ncat NECK: supple w/o LA CV: rrr. PULM: ctab, no inc wob ABD: soft, +bs EXT: no edema SKIN: no acute rash Chronic  R 1st toe changes with prior surgery noted.  She has a benign-appearing callus on the medial left fifth toe  At least 30 minutes were devoted to patient care in this encounter (this can include time spent reviewing the patient's file/history, interviewing and examining the patient, counseling/reviewing plan with patient, ordering referrals, ordering tests, reviewing relevant laboratory or x-ray data, and documenting the encounter).

## 2020-11-30 NOTE — Patient Instructions (Addendum)
I would get repeat thyroid testing done in about 2-3 months.   See if you can find a comfortable toe spacer.   I'll await your bone density test.   Try kegel exercises.  Update me as needed.   Take care.  Glad to see you.

## 2020-12-01 DIAGNOSIS — Z978 Presence of other specified devices: Secondary | ICD-10-CM | POA: Insufficient documentation

## 2020-12-01 DIAGNOSIS — Z79890 Hormone replacement therapy (postmenopausal): Secondary | ICD-10-CM | POA: Insufficient documentation

## 2020-12-01 DIAGNOSIS — R399 Unspecified symptoms and signs involving the genitourinary system: Secondary | ICD-10-CM | POA: Insufficient documentation

## 2020-12-01 DIAGNOSIS — E039 Hypothyroidism, unspecified: Secondary | ICD-10-CM | POA: Insufficient documentation

## 2020-12-01 NOTE — Assessment & Plan Note (Signed)
We can refer her over to urology but it would make sense to try Kegel exercises in the meantime.  Anatomy and exercises discussed with patient.  She can update me as needed.

## 2020-12-01 NOTE — Assessment & Plan Note (Signed)
HRT per outside clinic.  I'll defer.  She is on estrogen per outside clinic.  Cancer risk, etc d/w pt.  I'll defer to patient and outside clinic.  I asked her specifically to find out about the plan to get off estrogen replacement gradually.

## 2020-12-01 NOTE — Assessment & Plan Note (Signed)
Nearly full range of motion on the right shoulder with abduction.  Continue with PT.

## 2020-12-01 NOTE — Assessment & Plan Note (Signed)
She is picking up orthotic today to use.  See discussion above.

## 2020-12-01 NOTE — Assessment & Plan Note (Signed)
TSH slightly low.  Taking 150mg  armour thyroid daily.  Per outside clinic.  No neck mass, no lumps.  Advised to either recheck her TSH here or at outside clinic.

## 2020-12-01 NOTE — Assessment & Plan Note (Signed)
Has a living will.  Husband is designated if patient were incapacitated. Then daughter Brittany designated if husband is incapacitated.   

## 2020-12-01 NOTE — Assessment & Plan Note (Signed)
Tetanus can be done at pharmacy. Shingles d/w pt.   Flu shot 2020.  encouraged.  Declined. Pap not due.  covid vaccine encouraged.  Declined. DXA 2018, she'll call about scheduling.  Colonoscopy 2018 Mammogram 2021 Has a living will. Husband is designated if patient were incapacitated.Then daughter [place] designated if husband is incapacitated.   Diet and exercise d/w pt.

## 2020-12-02 DIAGNOSIS — M25611 Stiffness of right shoulder, not elsewhere classified: Secondary | ICD-10-CM | POA: Diagnosis not present

## 2020-12-02 DIAGNOSIS — Z96611 Presence of right artificial shoulder joint: Secondary | ICD-10-CM | POA: Diagnosis not present

## 2020-12-02 DIAGNOSIS — Z471 Aftercare following joint replacement surgery: Secondary | ICD-10-CM | POA: Diagnosis not present

## 2020-12-14 DIAGNOSIS — Z471 Aftercare following joint replacement surgery: Secondary | ICD-10-CM | POA: Diagnosis not present

## 2020-12-14 DIAGNOSIS — M25611 Stiffness of right shoulder, not elsewhere classified: Secondary | ICD-10-CM | POA: Diagnosis not present

## 2020-12-14 DIAGNOSIS — Z96611 Presence of right artificial shoulder joint: Secondary | ICD-10-CM | POA: Diagnosis not present

## 2020-12-16 DIAGNOSIS — M25611 Stiffness of right shoulder, not elsewhere classified: Secondary | ICD-10-CM | POA: Diagnosis not present

## 2020-12-16 DIAGNOSIS — Z96611 Presence of right artificial shoulder joint: Secondary | ICD-10-CM | POA: Diagnosis not present

## 2020-12-16 DIAGNOSIS — Z471 Aftercare following joint replacement surgery: Secondary | ICD-10-CM | POA: Diagnosis not present

## 2020-12-24 DIAGNOSIS — Z96611 Presence of right artificial shoulder joint: Secondary | ICD-10-CM | POA: Diagnosis not present

## 2020-12-24 DIAGNOSIS — M25611 Stiffness of right shoulder, not elsewhere classified: Secondary | ICD-10-CM | POA: Diagnosis not present

## 2020-12-24 DIAGNOSIS — Z471 Aftercare following joint replacement surgery: Secondary | ICD-10-CM | POA: Diagnosis not present

## 2020-12-24 IMAGING — DX DG SHOULDER 2+V PORT*R*
1 series · 1 of 1 positions shown · non-contrast
Comparison: April 12, 2020.

CLINICAL DATA: Status post reverse total shoulder arthroplasty.

EXAM:
PORTABLE RIGHT SHOULDER

[shoulder ap]
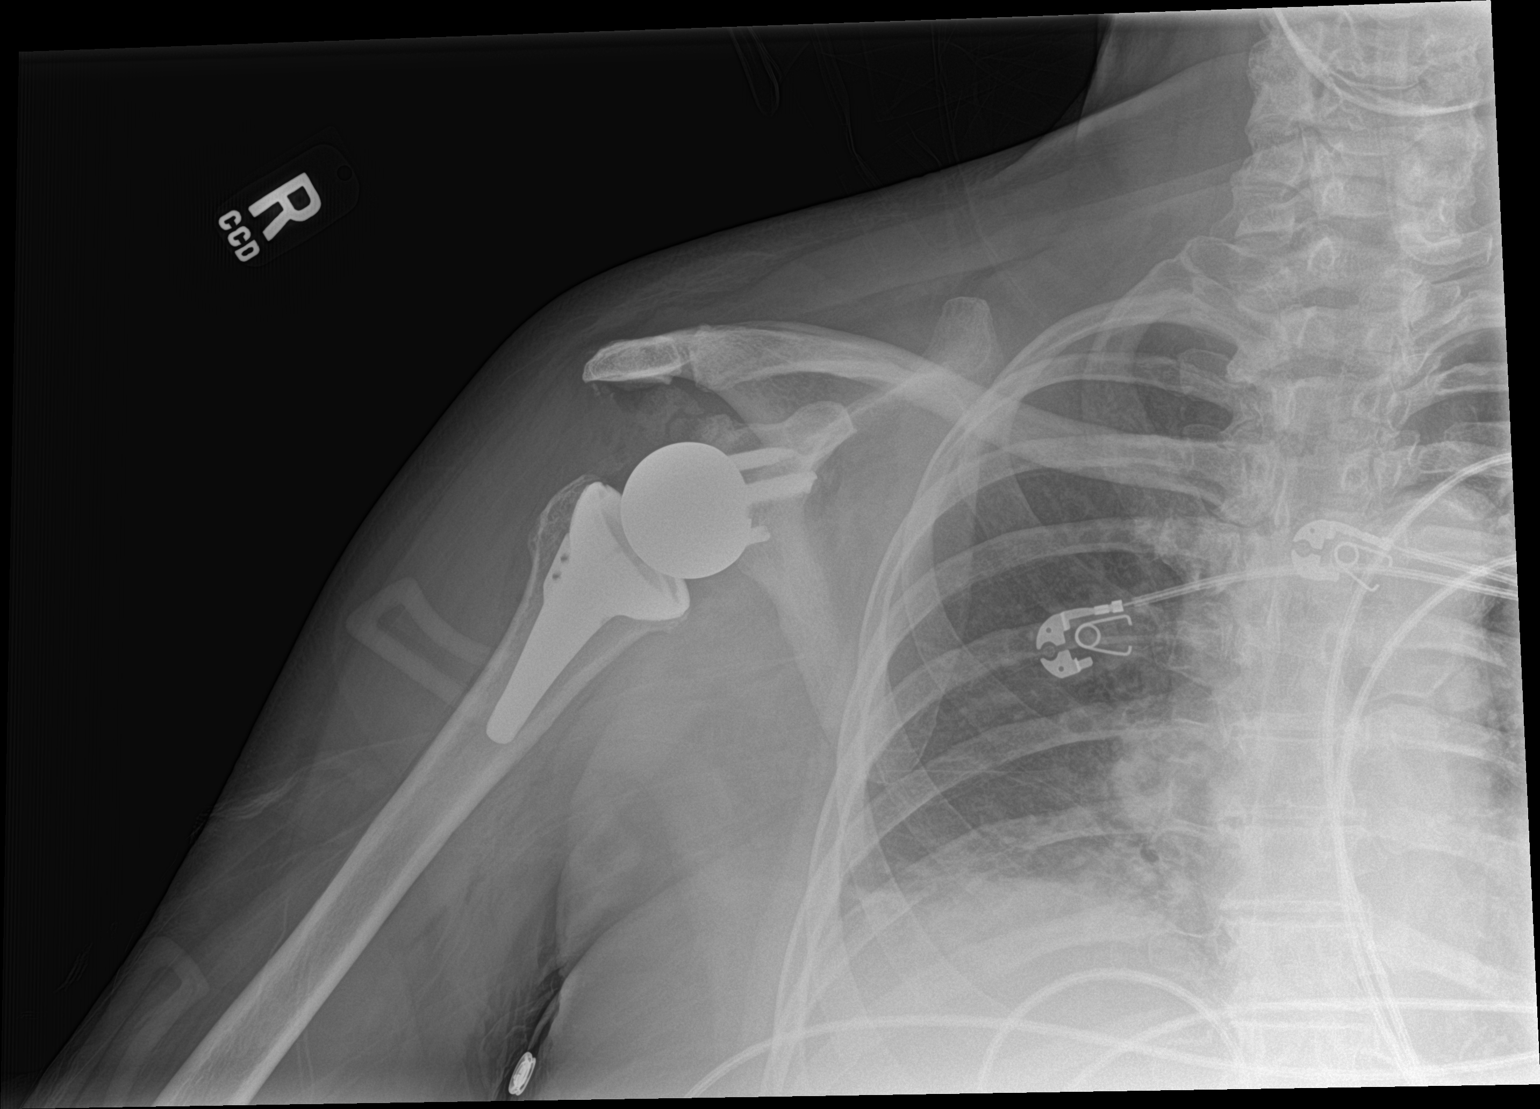

[1 of 1 positions shown; findings below may reference images not displayed]

FINDINGS: The right glenoid and humeral components appear to be well situated.
No dislocation is noted.
IMPRESSION: Status post right total shoulder arthroplasty.

## 2021-01-05 DIAGNOSIS — Z96611 Presence of right artificial shoulder joint: Secondary | ICD-10-CM | POA: Diagnosis not present

## 2021-01-05 DIAGNOSIS — Z471 Aftercare following joint replacement surgery: Secondary | ICD-10-CM | POA: Diagnosis not present

## 2021-01-21 ENCOUNTER — Telehealth: Payer: Self-pay | Admitting: *Deleted

## 2021-01-21 DIAGNOSIS — R399 Unspecified symptoms and signs involving the genitourinary system: Secondary | ICD-10-CM

## 2021-01-21 DIAGNOSIS — R3911 Hesitancy of micturition: Secondary | ICD-10-CM | POA: Diagnosis not present

## 2021-01-21 DIAGNOSIS — N393 Stress incontinence (female) (male): Secondary | ICD-10-CM | POA: Diagnosis not present

## 2021-01-21 NOTE — Telephone Encounter (Signed)
Patient left a voicemail stating that she saw Dr. Damita Dunnings back in December and at that visit she mentioned problems with urinating. Patient stated that she has had a problem with stopping and going with urination. Called patient back and was advised that she went to [facility] in [place] after leaving the message for Dr. Damita Dunnings. Patient stated that they checked her for a UTI and she was advised that she does not have an infection. Patient stated  the provider there that saw her at the [facility] told her that she needed a referral to a urologist. Patient stated that she does not have a fever. Patient stated the provider at the UC told her if she started with a fever or abdominal pain she should go to the ER. Patient was advised to drink lots of water. Patient was given ER precautions and she verbalized understanding.

## 2021-01-21 NOTE — Telephone Encounter (Signed)
For clarification- is the only issue now that she needs urology referral?  If so, I can order it.  Please let me know . Thanks.

## 2021-01-21 NOTE — Telephone Encounter (Signed)
Spoke with patient and yes this is the only issue

## 2021-01-23 NOTE — Telephone Encounter (Signed)
Referral placed  Thanks!

## 2021-01-27 DIAGNOSIS — H16143 Punctate keratitis, bilateral: Secondary | ICD-10-CM | POA: Diagnosis not present

## 2021-01-27 DIAGNOSIS — R3914 Feeling of incomplete bladder emptying: Secondary | ICD-10-CM | POA: Diagnosis not present

## 2021-01-27 DIAGNOSIS — R39198 Other difficulties with micturition: Secondary | ICD-10-CM | POA: Diagnosis not present

## 2021-01-27 DIAGNOSIS — H02889 Meibomian gland dysfunction of unspecified eye, unspecified eyelid: Secondary | ICD-10-CM | POA: Diagnosis not present

## 2021-02-11 DIAGNOSIS — Z780 Asymptomatic menopausal state: Secondary | ICD-10-CM | POA: Diagnosis not present

## 2021-02-11 DIAGNOSIS — Z13820 Encounter for screening for osteoporosis: Secondary | ICD-10-CM | POA: Diagnosis not present

## 2021-02-11 DIAGNOSIS — Z1231 Encounter for screening mammogram for malignant neoplasm of breast: Secondary | ICD-10-CM | POA: Diagnosis not present

## 2021-02-11 LAB — HM MAMMOGRAPHY

## 2021-02-11 LAB — HM DEXA SCAN: HM Dexa Scan: NORMAL

## 2021-02-15 DIAGNOSIS — R3914 Feeling of incomplete bladder emptying: Secondary | ICD-10-CM | POA: Diagnosis not present

## 2021-02-22 DIAGNOSIS — R3914 Feeling of incomplete bladder emptying: Secondary | ICD-10-CM | POA: Diagnosis not present

## 2021-02-22 DIAGNOSIS — R39198 Other difficulties with micturition: Secondary | ICD-10-CM | POA: Diagnosis not present

## 2021-03-03 DIAGNOSIS — H16143 Punctate keratitis, bilateral: Secondary | ICD-10-CM | POA: Diagnosis not present

## 2021-03-03 DIAGNOSIS — H16223 Keratoconjunctivitis sicca, not specified as Sjogren's, bilateral: Secondary | ICD-10-CM | POA: Diagnosis not present

## 2021-03-03 DIAGNOSIS — H02889 Meibomian gland dysfunction of unspecified eye, unspecified eyelid: Secondary | ICD-10-CM | POA: Diagnosis not present

## 2021-03-15 DIAGNOSIS — R39198 Other difficulties with micturition: Secondary | ICD-10-CM | POA: Diagnosis not present

## 2021-03-15 DIAGNOSIS — R3914 Feeling of incomplete bladder emptying: Secondary | ICD-10-CM | POA: Diagnosis not present

## 2021-03-20 ENCOUNTER — Inpatient Hospital Stay (HOSPITAL_COMMUNITY): Payer: PPO

## 2021-03-20 ENCOUNTER — Inpatient Hospital Stay (HOSPITAL_COMMUNITY)
Admission: EM | Admit: 2021-03-20 | Discharge: 2021-03-22 | DRG: 246 | Disposition: A | Discharge status: Home / Self Care | Payer: PPO | Attending: Interventional Cardiology | Admitting: Interventional Cardiology

## 2021-03-20 ENCOUNTER — Emergency Department (HOSPITAL_COMMUNITY): Payer: PPO

## 2021-03-20 ENCOUNTER — Inpatient Hospital Stay (HOSPITAL_COMMUNITY)
Admission: EM | Disposition: A | Discharge status: Home / Self Care | Payer: Self-pay | Source: Home / Self Care | Attending: Interventional Cardiology

## 2021-03-20 DIAGNOSIS — I2109 ST elevation (STEMI) myocardial infarction involving other coronary artery of anterior wall: Secondary | ICD-10-CM | POA: Diagnosis not present

## 2021-03-20 DIAGNOSIS — Z888 Allergy status to other drugs, medicaments and biological substances status: Secondary | ICD-10-CM | POA: Diagnosis not present

## 2021-03-20 DIAGNOSIS — Z789 Other specified health status: Secondary | ICD-10-CM

## 2021-03-20 DIAGNOSIS — Z20822 Contact with and (suspected) exposure to covid-19: Secondary | ICD-10-CM | POA: Diagnosis not present

## 2021-03-20 DIAGNOSIS — Z79899 Other long term (current) drug therapy: Secondary | ICD-10-CM | POA: Diagnosis not present

## 2021-03-20 DIAGNOSIS — I110 Hypertensive heart disease with heart failure: Secondary | ICD-10-CM | POA: Diagnosis present

## 2021-03-20 DIAGNOSIS — E785 Hyperlipidemia, unspecified: Secondary | ICD-10-CM

## 2021-03-20 DIAGNOSIS — I2102 ST elevation (STEMI) myocardial infarction involving left anterior descending coronary artery: Secondary | ICD-10-CM

## 2021-03-20 DIAGNOSIS — I213 ST elevation (STEMI) myocardial infarction of unspecified site: Secondary | ICD-10-CM

## 2021-03-20 DIAGNOSIS — R110 Nausea: Secondary | ICD-10-CM | POA: Diagnosis not present

## 2021-03-20 DIAGNOSIS — I2510 Atherosclerotic heart disease of native coronary artery without angina pectoris: Secondary | ICD-10-CM | POA: Diagnosis present

## 2021-03-20 DIAGNOSIS — I472 Ventricular tachycardia: Secondary | ICD-10-CM | POA: Diagnosis not present

## 2021-03-20 DIAGNOSIS — Z8261 Family history of arthritis: Secondary | ICD-10-CM

## 2021-03-20 DIAGNOSIS — E039 Hypothyroidism, unspecified: Secondary | ICD-10-CM | POA: Diagnosis not present

## 2021-03-20 DIAGNOSIS — E782 Mixed hyperlipidemia: Secondary | ICD-10-CM | POA: Diagnosis not present

## 2021-03-20 DIAGNOSIS — Z90710 Acquired absence of both cervix and uterus: Secondary | ICD-10-CM

## 2021-03-20 DIAGNOSIS — I255 Ischemic cardiomyopathy: Secondary | ICD-10-CM | POA: Diagnosis present

## 2021-03-20 DIAGNOSIS — Z8249 Family history of ischemic heart disease and other diseases of the circulatory system: Secondary | ICD-10-CM

## 2021-03-20 DIAGNOSIS — Z79890 Hormone replacement therapy (postmenopausal): Secondary | ICD-10-CM | POA: Diagnosis not present

## 2021-03-20 DIAGNOSIS — R079 Chest pain, unspecified: Secondary | ICD-10-CM | POA: Diagnosis not present

## 2021-03-20 DIAGNOSIS — I959 Hypotension, unspecified: Secondary | ICD-10-CM | POA: Diagnosis not present

## 2021-03-20 DIAGNOSIS — R0902 Hypoxemia: Secondary | ICD-10-CM | POA: Diagnosis not present

## 2021-03-20 DIAGNOSIS — I5021 Acute systolic (congestive) heart failure: Secondary | ICD-10-CM | POA: Diagnosis not present

## 2021-03-20 DIAGNOSIS — I2582 Chronic total occlusion of coronary artery: Secondary | ICD-10-CM | POA: Diagnosis not present

## 2021-03-20 DIAGNOSIS — R0789 Other chest pain: Secondary | ICD-10-CM | POA: Diagnosis not present

## 2021-03-20 DIAGNOSIS — R61 Generalized hyperhidrosis: Secondary | ICD-10-CM | POA: Diagnosis not present

## 2021-03-20 DIAGNOSIS — I519 Heart disease, unspecified: Secondary | ICD-10-CM | POA: Diagnosis not present

## 2021-03-20 DIAGNOSIS — M1990 Unspecified osteoarthritis, unspecified site: Secondary | ICD-10-CM | POA: Diagnosis present

## 2021-03-20 DIAGNOSIS — Z955 Presence of coronary angioplasty implant and graft: Secondary | ICD-10-CM

## 2021-03-20 HISTORY — PX: LEFT HEART CATH AND CORONARY ANGIOGRAPHY: CATH118249

## 2021-03-20 HISTORY — DX: Ischemic cardiomyopathy: I25.5

## 2021-03-20 HISTORY — DX: ST elevation (STEMI) myocardial infarction of unspecified site: I21.3

## 2021-03-20 HISTORY — PX: CORONARY/GRAFT ACUTE MI REVASCULARIZATION: CATH118305

## 2021-03-20 HISTORY — PX: CORONARY STENT INTERVENTION: CATH118234

## 2021-03-20 LAB — CBC WITH DIFFERENTIAL/PLATELET
Abs Immature Granulocytes: 0.11 10*3/uL — ABNORMAL HIGH (ref 0.00–0.07)
Basophils Absolute: 0.1 10*3/uL (ref 0.0–0.1)
Basophils Relative: 0 %
Eosinophils Absolute: 0.1 10*3/uL (ref 0.0–0.5)
Eosinophils Relative: 1 %
HCT: 47.2 % — ABNORMAL HIGH (ref 36.0–46.0)
Hemoglobin: 15.7 g/dL — ABNORMAL HIGH (ref 12.0–15.0)
Immature Granulocytes: 1 %
Lymphocytes Relative: 18 %
Lymphs Abs: 2.9 10*3/uL (ref 0.7–4.0)
MCH: 30.9 pg (ref 26.0–34.0)
MCHC: 33.3 g/dL (ref 30.0–36.0)
MCV: 92.9 fL (ref 80.0–100.0)
Monocytes Absolute: 1 10*3/uL (ref 0.1–1.0)
Monocytes Relative: 6 %
Neutro Abs: 12.1 10*3/uL — ABNORMAL HIGH (ref 1.7–7.7)
Neutrophils Relative %: 74 %
Platelets: 251 10*3/uL (ref 150–400)
RBC: 5.08 MIL/uL (ref 3.87–5.11)
RDW: 13.3 % (ref 11.5–15.5)
WBC: 16.4 10*3/uL — ABNORMAL HIGH (ref 4.0–10.5)
nRBC: 0 % (ref 0.0–0.2)

## 2021-03-20 LAB — PROTIME-INR
INR: 0.9 (ref 0.8–1.2)
Prothrombin Time: 12.1 seconds (ref 11.4–15.2)

## 2021-03-20 LAB — MRSA PCR SCREENING: MRSA by PCR: NEGATIVE

## 2021-03-20 LAB — LIPID PANEL
Cholesterol: 240 mg/dL — ABNORMAL HIGH (ref 0–200)
HDL: 44 mg/dL (ref >40)
LDL Cholesterol: 163 mg/dL — ABNORMAL HIGH (ref 0–99)
Total CHOL/HDL Ratio: 5.5 RATIO
Triglycerides: 167 mg/dL — ABNORMAL HIGH (ref <150)
VLDL: 33 mg/dL (ref 0–40)

## 2021-03-20 LAB — ECHOCARDIOGRAM COMPLETE
AR max vel: 2.54 cm2
AV Area VTI: 2.46 cm2
AV Area mean vel: 1.99 cm2
AV Mean grad: 4 mmHg
AV Peak grad: 6.8 mmHg
Ao pk vel: 1.3 m/s
Area-P 1/2: 3.37 cm2
Calc EF: 47 %
Height: 64 in
MV VTI: 2.68 cm2
S' Lateral: 3.2 cm
Single Plane A2C EF: 42.2 %
Single Plane A4C EF: 50.5 %
Weight: 2666.68 oz

## 2021-03-20 LAB — COMPREHENSIVE METABOLIC PANEL
ALT: 16 U/L (ref 0–44)
AST: 50 U/L — ABNORMAL HIGH (ref 15–41)
Albumin: 3.9 g/dL (ref 3.5–5.0)
Alkaline Phosphatase: 60 U/L (ref 38–126)
Anion gap: 14 (ref 5–15)
BUN: 16 mg/dL (ref 8–23)
CO2: 20 mmol/L — ABNORMAL LOW (ref 22–32)
Calcium: 9.6 mg/dL (ref 8.9–10.3)
Chloride: 101 mmol/L (ref 98–111)
Creatinine, Ser: 1.05 mg/dL — ABNORMAL HIGH (ref 0.44–1.00)
GFR, Estimated: 58 mL/min — ABNORMAL LOW (ref >60)
Glucose, Bld: 181 mg/dL — ABNORMAL HIGH (ref 70–99)
Potassium: 4.7 mmol/L (ref 3.5–5.1)
Sodium: 135 mmol/L (ref 135–145)
Total Bilirubin: 0.9 mg/dL (ref 0.3–1.2)
Total Protein: 6.7 g/dL (ref 6.5–8.1)

## 2021-03-20 LAB — HEMOGLOBIN A1C
Hgb A1c MFr Bld: 5.4 % (ref 4.8–5.6)
Mean Plasma Glucose: 108.28 mg/dL

## 2021-03-20 LAB — GLUCOSE, CAPILLARY: Glucose-Capillary: 120 mg/dL — ABNORMAL HIGH (ref 70–99)

## 2021-03-20 LAB — POCT ACTIVATED CLOTTING TIME
Activated Clotting Time: 339 seconds
Activated Clotting Time: 273 seconds

## 2021-03-20 LAB — APTT: aPTT: 24 seconds (ref 24–36)

## 2021-03-20 LAB — TROPONIN I (HIGH SENSITIVITY)
Troponin I (High Sensitivity): 243 ng/L (ref <18)
Troponin I (High Sensitivity): 7259 ng/L (ref <18)

## 2021-03-20 LAB — MAGNESIUM: Magnesium: 2.1 mg/dL (ref 1.7–2.4)

## 2021-03-20 LAB — RESP PANEL BY RT-PCR (FLU A&B, COVID) ARPGX2
Influenza A by PCR: NEGATIVE
Influenza B by PCR: NEGATIVE
SARS Coronavirus 2 by RT PCR: NEGATIVE

## 2021-03-20 SURGERY — CORONARY/GRAFT ACUTE MI REVASCULARIZATION
Anesthesia: LOCAL

## 2021-03-20 MED ORDER — HEPARIN (PORCINE) IN NACL 1000-0.9 UT/500ML-% IV SOLN
INTRAVENOUS | Status: AC
  Filled 2021-03-20: qty 500

## 2021-03-20 MED ORDER — MIDAZOLAM HCL 2 MG/2ML IJ SOLN
INTRAMUSCULAR | Status: AC
  Filled 2021-03-20: qty 2

## 2021-03-20 MED ORDER — LIDOCAINE HCL (PF) 1 % IJ SOLN
INTRAMUSCULAR | Status: DC | PRN
  Administered 2021-03-20: 5 mL via SUBCUTANEOUS

## 2021-03-20 MED ORDER — IOHEXOL 350 MG/ML SOLN
INTRAVENOUS | Status: DC | PRN
  Administered 2021-03-20: 160 mL via INTRA_ARTERIAL

## 2021-03-20 MED ORDER — VERAPAMIL HCL 2.5 MG/ML IV SOLN
INTRAVENOUS | Status: AC
  Filled 2021-03-20: qty 2

## 2021-03-20 MED ORDER — NITROGLYCERIN 0.4 MG SL SUBL
0.4000 mg | SUBLINGUAL_TABLET | SUBLINGUAL | Status: DC | PRN
  Administered 2021-03-20: 0.4 mg via SUBLINGUAL
  Filled 2021-03-20: qty 1

## 2021-03-20 MED ORDER — SODIUM CHLORIDE 0.9 % IV SOLN
INTRAVENOUS | Status: AC | PRN
  Administered 2021-03-20: 10 mL/h via INTRAVENOUS

## 2021-03-20 MED ORDER — ONDANSETRON HCL 4 MG/2ML IJ SOLN: 4.0000 mg | Freq: Four times a day (QID) | INTRAMUSCULAR | Status: DC | PRN

## 2021-03-20 MED ORDER — NITROGLYCERIN 1 MG/10 ML FOR IR/CATH LAB
INTRA_ARTERIAL | Status: DC | PRN
  Administered 2021-03-20: 200 ug via INTRACORONARY
  Administered 2021-03-20: 400 mL via INTRA_ARTERIAL

## 2021-03-20 MED ORDER — CHLORHEXIDINE GLUCONATE CLOTH 2 % EX PADS
6.0000 | MEDICATED_PAD | Freq: Every day | CUTANEOUS | Status: DC
  Administered 2021-03-20 – 2021-03-22 (×3): 6 via TOPICAL

## 2021-03-20 MED ORDER — HEPARIN SODIUM (PORCINE) 5000 UNIT/ML IJ SOLN
4000.0000 [IU] | Freq: Once | INTRAMUSCULAR | Status: AC
  Administered 2021-03-20: 4000 [IU] via INTRAVENOUS
  Filled 2021-03-20: qty 1

## 2021-03-20 MED ORDER — TICAGRELOR 90 MG PO TABS
ORAL_TABLET | ORAL | Status: AC
  Filled 2021-03-20: qty 1

## 2021-03-20 MED ORDER — METOPROLOL TARTRATE 12.5 MG HALF TABLET
12.5000 mg | ORAL_TABLET | Freq: Two times a day (BID) | ORAL | Status: DC
  Administered 2021-03-20: 12.5 mg via ORAL
  Filled 2021-03-20: qty 1

## 2021-03-20 MED ORDER — SODIUM CHLORIDE 0.9% FLUSH
3.0000 mL | Freq: Two times a day (BID) | INTRAVENOUS | Status: DC
  Administered 2021-03-20 – 2021-03-22 (×5): 3 mL via INTRAVENOUS

## 2021-03-20 MED ORDER — DEXTROSE 50 % IV SOLN
INTRAVENOUS | Status: AC
  Filled 2021-03-20: qty 50

## 2021-03-20 MED ORDER — SODIUM CHLORIDE 0.9 % IV SOLN: INTRAVENOUS | Status: DC

## 2021-03-20 MED ORDER — FENTANYL CITRATE (PF) 100 MCG/2ML IJ SOLN
INTRAMUSCULAR | Status: DC | PRN
  Administered 2021-03-20: 25 ug via INTRAVENOUS

## 2021-03-20 MED ORDER — HEPARIN SODIUM (PORCINE) 1000 UNIT/ML IJ SOLN
INTRAMUSCULAR | Status: DC | PRN
  Administered 2021-03-20: 6000 [IU] via INTRAVENOUS

## 2021-03-20 MED ORDER — VERAPAMIL HCL 2.5 MG/ML IV SOLN
INTRAVENOUS | Status: DC | PRN
  Administered 2021-03-20: 10 mL via INTRA_ARTERIAL

## 2021-03-20 MED ORDER — ACETAMINOPHEN 325 MG PO TABS: 650.0000 mg | ORAL_TABLET | ORAL | Status: DC | PRN

## 2021-03-20 MED ORDER — HYDRALAZINE HCL 20 MG/ML IJ SOLN: 10.0000 mg | INTRAMUSCULAR | Status: AC | PRN

## 2021-03-20 MED ORDER — ASPIRIN 81 MG PO CHEW
81.0000 mg | CHEWABLE_TABLET | Freq: Every day | ORAL | Status: DC
  Administered 2021-03-20 – 2021-03-22 (×3): 81 mg via ORAL
  Filled 2021-03-20 (×3): qty 1

## 2021-03-20 MED ORDER — THYROID 60 MG PO TABS
90.0000 mg | ORAL_TABLET | Freq: Every day | ORAL | Status: DC
  Administered 2021-03-20 – 2021-03-22 (×3): 90 mg via ORAL
  Filled 2021-03-20 (×3): qty 2

## 2021-03-20 MED ORDER — HEPARIN (PORCINE) IN NACL 1000-0.9 UT/500ML-% IV SOLN
INTRAVENOUS | Status: DC | PRN
  Administered 2021-03-20 (×2): 500 mL

## 2021-03-20 MED ORDER — SODIUM CHLORIDE 0.9 % IV SOLN: 250.0000 mL | INTRAVENOUS | Status: DC | PRN

## 2021-03-20 MED ORDER — ONDANSETRON HCL 4 MG/2ML IJ SOLN
INTRAMUSCULAR | Status: AC
  Filled 2021-03-20: qty 2

## 2021-03-20 MED ORDER — METOPROLOL TARTRATE 25 MG PO TABS
25.0000 mg | ORAL_TABLET | Freq: Two times a day (BID) | ORAL | Status: DC
  Administered 2021-03-20 – 2021-03-22 (×4): 25 mg via ORAL
  Filled 2021-03-20 (×4): qty 1

## 2021-03-20 MED ORDER — HEPARIN SODIUM (PORCINE) 1000 UNIT/ML IJ SOLN
INTRAMUSCULAR | Status: AC
  Filled 2021-03-20: qty 1

## 2021-03-20 MED ORDER — OMEGA-3-ACID ETHYL ESTERS 1 G PO CAPS
1.0000 g | ORAL_CAPSULE | Freq: Every day | ORAL | Status: DC
  Administered 2021-03-20 – 2021-03-22 (×3): 1 g via ORAL
  Filled 2021-03-20 (×3): qty 1

## 2021-03-20 MED ORDER — THYROID 60 MG PO TABS
60.0000 mg | ORAL_TABLET | Freq: Every day | ORAL | Status: DC
  Administered 2021-03-20 – 2021-03-21 (×2): 60 mg via ORAL
  Filled 2021-03-20 (×3): qty 1

## 2021-03-20 MED ORDER — TIROFIBAN HCL IN NACL 5-0.9 MG/100ML-% IV SOLN
INTRAVENOUS | Status: DC | PRN
  Administered 2021-03-20: 0.15 ug/kg/min via INTRAVENOUS

## 2021-03-20 MED ORDER — SODIUM CHLORIDE 0.9% FLUSH: 3.0000 mL | INTRAVENOUS | Status: DC | PRN

## 2021-03-20 MED ORDER — FENTANYL CITRATE (PF) 100 MCG/2ML IJ SOLN
INTRAMUSCULAR | Status: AC
  Filled 2021-03-20: qty 2

## 2021-03-20 MED ORDER — ROSUVASTATIN CALCIUM 20 MG PO TABS
20.0000 mg | ORAL_TABLET | Freq: Every day | ORAL | Status: DC
  Administered 2021-03-20 – 2021-03-21 (×2): 20 mg via ORAL
  Filled 2021-03-20 (×2): qty 1

## 2021-03-20 MED ORDER — ONDANSETRON HCL 4 MG/2ML IJ SOLN
INTRAMUSCULAR | Status: DC | PRN
  Administered 2021-03-20: 4 mg via INTRAVENOUS

## 2021-03-20 MED ORDER — TICAGRELOR 90 MG PO TABS
ORAL_TABLET | ORAL | Status: DC | PRN
  Administered 2021-03-20: 180 mg via ORAL

## 2021-03-20 MED ORDER — IOHEXOL 350 MG/ML SOLN
INTRAVENOUS | Status: AC
  Filled 2021-03-20: qty 1

## 2021-03-20 MED ORDER — LIDOCAINE HCL (PF) 1 % IJ SOLN
INTRAMUSCULAR | Status: AC
  Filled 2021-03-20: qty 30

## 2021-03-20 MED ORDER — TIROFIBAN (AGGRASTAT) BOLUS VIA INFUSION
INTRAVENOUS | Status: DC | PRN
  Administered 2021-03-20: 1927.5 ug via INTRAVENOUS

## 2021-03-20 MED ORDER — MIDAZOLAM HCL 2 MG/2ML IJ SOLN
INTRAMUSCULAR | Status: DC | PRN
  Administered 2021-03-20: 1 mg via INTRAVENOUS

## 2021-03-20 MED ORDER — TICAGRELOR 90 MG PO TABS
90.0000 mg | ORAL_TABLET | Freq: Two times a day (BID) | ORAL | Status: DC
  Administered 2021-03-20 – 2021-03-22 (×5): 90 mg via ORAL
  Filled 2021-03-20 (×5): qty 1

## 2021-03-20 MED ORDER — PERFLUTREN LIPID MICROSPHERE
1.0000 mL | INTRAVENOUS | Status: AC | PRN
  Administered 2021-03-20: 3 mL via INTRAVENOUS
  Filled 2021-03-20: qty 10

## 2021-03-20 MED ORDER — TIROFIBAN HCL IN NACL 5-0.9 MG/100ML-% IV SOLN
INTRAVENOUS | Status: AC
  Filled 2021-03-20: qty 100

## 2021-03-20 MED ORDER — LABETALOL HCL 5 MG/ML IV SOLN: 10.0000 mg | INTRAVENOUS | Status: AC | PRN

## 2021-03-20 MED ORDER — TICAGRELOR 90 MG PO TABS: 90.0000 mg | ORAL_TABLET | Freq: Two times a day (BID) | ORAL | Status: DC

## 2021-03-20 SURGICAL SUPPLY — 27 items
BALLN SAPPHIRE 2.0X15 (BALLOONS) ×2
BALLN SAPPHIRE 3.0X15 (BALLOONS) ×2
BALLN SAPPHIRE ~~LOC~~ 2.5X12 (BALLOONS) ×2 IMPLANT
BALLN ~~LOC~~ EMERGE MR 3.75X8 (BALLOONS) ×2
BALLOON SAPPHIRE 2.0X15 (BALLOONS) ×1 IMPLANT
BALLOON SAPPHIRE 3.0X15 (BALLOONS) ×1 IMPLANT
BALLOON ~~LOC~~ EMERGE MR 3.75X8 (BALLOONS) ×1 IMPLANT
CATH 5FR JL3.5 JR4 ANG PIG MP (CATHETERS) ×2 IMPLANT
CATH EXTRAC PRONTO LP 6F RND (CATHETERS) ×2 IMPLANT
CATH LAUNCHER 6FR EBU3.5 (CATHETERS) ×2 IMPLANT
DEVICE RAD COMP TR BAND LRG (VASCULAR PRODUCTS) ×4 IMPLANT
GLIDESHEATH SLEND SS 6F .021 (SHEATH) ×2 IMPLANT
GUIDEWIRE INQWIRE 1.5J.035X260 (WIRE) ×1 IMPLANT
INQWIRE 1.5J .035X260CM (WIRE) ×2
KIT HEART LEFT (KITS) ×2 IMPLANT
KIT HEMO VALVE WATCHDOG (MISCELLANEOUS) ×2 IMPLANT
PACK CARDIAC CATHETERIZATION (CUSTOM PROCEDURE TRAY) ×2 IMPLANT
STENT SYNERGY XD 2.25X28 (Permanent Stent) ×1 IMPLANT
STENT SYNERGY XD 3.0X16 (Permanent Stent) ×1 IMPLANT
STENT SYNERGY XD 3.50X16 (Permanent Stent) ×1 IMPLANT
SYNERGY XD 2.25X28 (Permanent Stent) ×2 IMPLANT
SYNERGY XD 3.0X16 (Permanent Stent) ×2 IMPLANT
SYNERGY XD 3.50X16 (Permanent Stent) ×2 IMPLANT
TRANSDUCER W/STOPCOCK (MISCELLANEOUS) ×2 IMPLANT
TUBING CIL FLEX 10 FLL-RA (TUBING) ×2 IMPLANT
WIRE ASAHI PROWATER 180CM (WIRE) ×2 IMPLANT
WIRE HI TORQ BMW 190CM (WIRE) ×2 IMPLANT

## 2021-03-20 NOTE — ED Notes (Signed)
Cardiology at bedside.

## 2021-03-20 NOTE — ED Provider Notes (Signed)
[facility] EMERGENCY DEPARTMENT Provider Note   CSN: [id number] Arrival date & time: 03/20/21  0206     History Chief Complaint  Patient presents with  . Chest Pain    Kim Brown is a 70 y.o. female.  Patient with history of anemia, arthritis, hypertension hyperlipidemia here with chest pain.  Pain onset around 10:30 PM while she was going to bed.  Pain radiates to her mouth, jaw and teeth.  Tylenol did not help.  EMS gave aspirin.  Patient complains of nausea, sweating and dizziness and shortness of breath.  Denies any cardiac history. Denies any blood thinner use.  Denies any abdominal pain or back pain.  Complains of chest pressure going to her neck and jaw.  EKG shows ST elevation anteriorly with ST depression inferiorly and laterally. Code STEMI activated on initial evaluation.  The history is provided by the patient and the EMS personnel. The history is limited by the condition of the patient.  Chest Pain Associated symptoms: nausea and shortness of breath   Associated symptoms: no fever and no headache        Past Medical History:  Diagnosis Date  . Allergy   . Anemia   . Arthritis    feet and hands , neck, shoulder  . Dysrhythmia 2006   atrial flutter  . Exogenous obesity   . Heel spur   . Hyperlipidemia   . Hypertension   . Hypothyroid   . Irregular heart beats   . Palpitations   . Prolapsed, anus    uses mag oxide powder nightly   . RSD (reflex sympathetic dystrophy)    after surgery on R foot  . Stress incontinence, female     Patient Active Problem List   Diagnosis Date Noted  . Lower urinary tract symptoms (LUTS) 12/01/2020  . Hormone replacement therapy (HRT) 12/01/2020  . Hypothyroidism 12/01/2020  . Presence of orthotic device 12/01/2020  . Dysuria 09/18/2019  . Shoulder pain 09/18/2019  . Healthcare maintenance 05/01/2018  . Allergic urticaria 01/29/2018  . Perennial allergic rhinitis 01/29/2018  . Allergic reaction  01/29/2018  . Advance care planning 03/21/2017  . Unspecified constipation 06/22/2013  . Dyspnea 06/22/2013  . Hypercholesterolemia 09/07/2011    Past Surgical History:  Procedure Laterality Date  . ABDOMINAL HYSTERECTOMY  2005  . BUNIONECTOMY    . COLONOSCOPY    . DILATION AND CURETTAGE OF UTERUS    . REVERSE SHOULDER ARTHROPLASTY Right 07/15/2020   Procedure: REVERSE SHOULDER ARTHROPLASTY;  Surgeon: Tania Ade, MD;  Location: [facility] ORS;  Service: Orthopedics;  Laterality: Right;     OB History   No obstetric history on file.     Family History  Problem Relation Age of Onset  . Arthritis Mother   . Hypertension Mother   . Colon polyps Mother   . Heart disease Father   . Hypertension Father   . Arthritis Father   . Stroke Father   . Heart failure Brother   . Diabetes Brother   . Pancreatic cancer Brother   . Colon cancer Neg Hx   . Breast cancer Neg Hx   . Esophageal cancer Neg Hx   . Rectal cancer Neg Hx   . Stomach cancer Neg Hx     Social History   Tobacco Use  . Smoking status: Never Smoker  . Smokeless tobacco: Never Used  Vaping Use  . Vaping Use: Never used  Substance Use Topics  . Alcohol use: No  . Drug  use: No    Home Medications Prior to Admission medications   Medication Sig Start Date End Date Taking? Authorizing Provider  Ascorbic Acid (VITAMIN C) 1000 MG tablet Take 1,000 mg by mouth 2 (two) times daily.    [provider]  b complex vitamins tablet Take 1 tablet by mouth 3 (three) times a week.    [provider]  beta carotene 10000 UNIT capsule Take 10,000 Units by mouth daily.    [provider]  Calcium Carbonate-Vitamin D (CALTRATE 600+D PO) Take by mouth daily.    [provider]  Cholecalciferol (VITAMIN D) 2000 UNITS CAPS Take 1 capsule by mouth 2 (two) times daily.    [provider]  DHEA 25 MG CAPS Take 25 mg by mouth daily.    [provider]  fish oil-omega-3 fatty acids  1000 MG capsule Take 1 g by mouth daily.    [provider]  Magnesium 250 MG TABS Take by mouth daily.    [provider]  Melatonin 10 MG TABS Take 10 mg by mouth at bedtime.     [provider]  NON FORMULARY Progesterone pellet/ estrogen  per outside clinic.    [provider]  progesterone (PROMETRIUM) 100 MG capsule Take 300 mg by mouth at bedtime.  03/26/20   [provider]  thyroid (ARMOUR) 60 MG tablet Take 60-90 mg by mouth See admin instructions. 90mg  in AM and 60mg  in PM    [provider]  zinc gluconate 50 MG tablet Take 50 mg by mouth daily.    [provider]    Allergies    Albuterol and Lovastatin  Review of Systems   Review of Systems  Constitutional: Negative for fever.  HENT: Negative for congestion and nosebleeds.   Respiratory: Positive for chest tightness and shortness of breath.   Cardiovascular: Positive for chest pain.  Gastrointestinal: Positive for nausea.  Genitourinary: Negative for dysuria and hematuria.  Musculoskeletal: Negative for arthralgias and myalgias.  Neurological: Negative for headaches.   all other systems are negative except as noted in the HPI and PMH.    Physical Exam Updated Vital Signs BP (!) 130/91 (BP Location: Right Arm)   Pulse 87   Temp 97.9 F (36.6 C) (Oral)   Resp 16   Ht 5\' 4"  (1.626 m)   Wt 77.1 kg   SpO2 100%   BMI 29.18 kg/m   Physical Exam Vitals and nursing note reviewed.  Constitutional:      General: She is in acute distress.     Appearance: She is well-developed. She is diaphoretic.  HENT:     Head: Normocephalic and atraumatic.     Mouth/Throat:     Pharynx: No oropharyngeal exudate.  Eyes:     Conjunctiva/sclera: Conjunctivae normal.     Pupils: Pupils are equal, round, and reactive to light.  Neck:     Comments: No meningismus. Cardiovascular:     Rate and Rhythm: Normal rate and regular rhythm.     Heart sounds: Normal heart sounds.  No murmur heard.   Pulmonary:     Effort: Pulmonary effort is normal. No respiratory distress.     Breath sounds: Normal breath sounds.  Abdominal:     Palpations: Abdomen is soft.     Tenderness: There is no abdominal tenderness. There is no guarding or rebound.  Musculoskeletal:        General: No tenderness. Normal range of motion.     Cervical  back: Normal range of motion and neck supple.  Skin:    General: Skin is warm.  Neurological:     Mental Status: She is alert and oriented to person, place, and time.     Cranial Nerves: No cranial nerve deficit.     Motor: No abnormal muscle tone.     Coordination: Coordination normal.     Comments:  5/5 strength throughout. CN 2-12 intact.Equal grip strength.   Psychiatric:        Behavior: Behavior normal.     ED Results / Procedures / Treatments   Labs (all labs ordered are listed, but only abnormal results are displayed) Labs Reviewed  RESP PANEL BY RT-PCR (FLU A&B, COVID) ARPGX2  HEMOGLOBIN A1C  CBC WITH DIFFERENTIAL/PLATELET  PROTIME-INR  APTT  COMPREHENSIVE METABOLIC PANEL  LIPID PANEL  TROPONIN I (HIGH SENSITIVITY)    EKG EKG Interpretation  Date/Time:  Sunday March 20 2021 02:11:17 EDT Ventricular Rate:  81 PR Interval:  156 QRS Duration: 90 QT Interval:  375 QTC Calculation: 436 R Axis:   -68 Text Interpretation: Sinus rhythm Left anterior fascicular block Probable anteroseptal infarct, recent anterior STEMI inferior lateral ST depression Confirmed by Ezequiel Essex [phone]) on 03/20/2021 2:18:16 AM   Radiology DG Chest Port 1 View  Result Date: 03/20/2021 CLINICAL DATA:  Chest pain EXAM: PORTABLE CHEST 1 VIEW COMPARISON:  07/12/2020 FINDINGS: Heart and mediastinal contours are within normal limits. No focal opacities or effusions. No acute bony abnormality. IMPRESSION: No active disease. Electronically Signed   By: Rolm Baptise M.D.   On: 03/20/2021 02:43    Procedures .Critical Care Performed by:  Ezequiel Essex, MD Authorized by: Ezequiel Essex, MD   Critical care provider statement:    Critical care time (minutes):  35   Critical care was necessary to treat or prevent imminent or life-threatening deterioration of the following conditions: STEMI.   Critical care was time spent personally by me on the following activities:  Discussions with consultants, evaluation of patient's response to treatment, examination of patient, ordering and performing treatments and interventions, ordering and review of laboratory studies, ordering and review of radiographic studies, pulse oximetry, re-evaluation of patient's condition, obtaining history from patient or surrogate and review of old charts     Medications Ordered in ED Medications  0.9 %  sodium chloride infusion (has no administration in time range)  heparin injection 4,000 Units (has no administration in time range)    ED Course  I have reviewed the triage vital signs and the nursing notes.  Pertinent labs & imaging results that were available during my care of the patient were reviewed by me and considered in my medical decision making (see chart for details).    MDM Rules/Calculators/A&P                         Chest pressure going to neck and jaw with shortness of breath, dizziness and sweating.  EKG shows ST elevation anteriorly with ST depression inferolaterally which is new.  Code STEMI activated. Patient received aspirin by EMS.  Given heparin loading dose  Discussed with Dr. Irish Lack of cardiology.  He agrees EKG is concerning for acute ischemia.  Patient given aspirin, heparin as well as nitroglycerin.  She is taken emergently to the cardiac catheterization lab. Vitals and mental status remained stable in the ED. Final Clinical Impression(s) / ED Diagnoses Final diagnoses:  ST elevation myocardial infarction (STEMI), unspecified artery ([place])    Rx /  DC Orders ED Discharge Orders    None       Celica Kotowski,  Annie Main, MD 03/20/21 [phone]

## 2021-03-20 NOTE — Progress Notes (Signed)
  Echocardiogram 2D Echocardiogram has been performed with [facility].  Kim Brown 03/20/2021, 2:59 PM

## 2021-03-20 NOTE — H&P (Addendum)
Cardiology Admission History and Physical:   Patient ID: Kim Brown MRN: [id number]; DOB: 1951/07/19   Admission date: 03/20/2021  Primary Care Provider: Tonia Ghent, MD Primary Cardiologist: No primary care provider on file.  Primary Electrophysiologist:  None   Chief Complaint:  Chest pain, STEMI  Patient Profile:   Kim Brown is a 71 y.o. female with HLD, hypothyroidism presents with STEMI.   History of Present Illness:   [name] reports waxing and waning chest and jaw pain throughout the day yesterday.  This started shortly after waking up.  Throughout the day at this worsened and she developed epigastric symptoms with some nausea.  At approximately 10 PM, the symptoms became much worse and she eventually called EMS.  She took 324 mg of aspirin at home.  On arrival to the emergency department her vital signs were all within normal limits.  An ECG revealed ST elevation in the anterolateral distribution with reciprocal ST depression.  She was taken to the Cath Lab urgently for STEMI.  Heart Pathway Score:     Past Medical History:  Diagnosis Date  . Allergy   . Anemia   . Arthritis    feet and hands , neck, shoulder  . Dysrhythmia 2006   atrial flutter  . Exogenous obesity   . Heel spur   . Hyperlipidemia   . Hypertension   . Hypothyroid   . Irregular heart beats   . Palpitations   . Prolapsed, anus    uses mag oxide powder nightly   . RSD (reflex sympathetic dystrophy)    after surgery on R foot  . Stress incontinence, female     Past Surgical History:  Procedure Laterality Date  . ABDOMINAL HYSTERECTOMY  2005  . BUNIONECTOMY    . COLONOSCOPY    . DILATION AND CURETTAGE OF UTERUS    . REVERSE SHOULDER ARTHROPLASTY Right 07/15/2020   Procedure: REVERSE SHOULDER ARTHROPLASTY;  Surgeon: Kim Ade, MD;  Location: [facility];  Service: Orthopedics;  Laterality: Right;     Medications Prior to Admission: Prior to Admission medications   Medication  Sig Start Date End Date Taking? Authorizing Provider  Ascorbic Acid (VITAMIN C) 1000 MG tablet Take 1,000 mg by mouth 2 (two) times daily.    [provider]  b complex vitamins tablet Take 1 tablet by mouth 3 (three) times a week.    [provider]  beta carotene 10000 UNIT capsule Take 10,000 Units by mouth daily.    [provider]  Calcium Carbonate-Vitamin D (CALTRATE 600+D PO) Take by mouth daily.    [provider]  Cholecalciferol (VITAMIN D) 2000 UNITS CAPS Take 1 capsule by mouth 2 (two) times daily.    [provider]  DHEA 25 MG CAPS Take 25 mg by mouth daily.    [provider]  fish oil-omega-3 fatty acids 1000 MG capsule Take 1 g by mouth daily.    [provider]  Magnesium 250 MG TABS Take 250 mg by mouth daily.    [provider]  Melatonin 10 MG TABS Take 10 mg by mouth at bedtime.     [provider]  NON FORMULARY Progesterone pellet/ estrogen  per outside clinic.    [provider]  progesterone (PROMETRIUM) 100 MG capsule Take 300 mg by mouth at bedtime.  03/26/20   [provider]  thyroid (ARMOUR) 60 MG tablet Take 60-90 mg by mouth See admin instructions. 90mg  in AM and 60mg  in PM  [provider]  zinc gluconate 50 MG tablet Take 50 mg by mouth daily.    [provider]     Allergies:    Allergies  Allergen Reactions  . Albuterol     Jittery sensation  . Lovastatin     Myalgias    Social History:   Social History   Socioeconomic History  . Marital status: Married    Spouse name: Not on file  . Number of children: Not on file  . Years of education: Not on file  . Highest education level: Not on file  Occupational History  . Not on file  Tobacco Use  . Smoking status: Never Smoker  . Smokeless tobacco: Never Used  Vaping Use  . Vaping Use: Never used  Substance and Sexual Activity  . Alcohol use: No  . Drug use: No  . Sexual  activity: Yes  Other Topics Concern  . Not on file  Social History Narrative   Married 1972   2 daughters Kim Brown is one) and 1 son   9 grandkids    Occ subs at [employer]   Social Determinants of Radio broadcast assistant Strain: Low Risk   . Difficulty of Paying Living Expenses: Not hard at all  Food Insecurity: No Food Insecurity  . Worried About Charity fundraiser in the Last Year: Never true  . Ran Out of Food in the Last Year: Never true  Transportation Needs: No Transportation Needs  . Lack of Transportation (Medical): No  . Lack of Transportation (Non-Medical): No  Physical Activity: Insufficiently Active  . Days of Exercise per Week: 7 days  . Minutes of Exercise per Session: 10 min  Stress: No Stress Concern Present  . Feeling of Stress : Not at all  Social Connections: Not on file  Intimate Partner Violence: Not At Risk  . Fear of Current or Ex-Partner: No  . Emotionally Abused: No  . Physically Abused: No  . Sexually Abused: No    Family History:   The patient's family history includes Arthritis in her father and mother; Colon polyps in her mother; Diabetes in her brother; Heart disease in her father; Heart failure in her brother; Hypertension in her father and mother; Pancreatic cancer in her brother; Stroke in her father. There is no history of Colon cancer, Breast cancer, Esophageal cancer, Rectal cancer, or Stomach cancer.    ROS:  Please see the history of present illness.  All other ROS reviewed and negative.     Physical Exam/Data:   Vitals:   03/20/21 0215 03/20/21 0230 03/20/21 0245 03/20/21 0302  BP: 140/90 133/82 103/64   Pulse: 89 89 75   Resp: 16 16 20    Temp:      TempSrc:      SpO2: 100% 100% 97% 96%  Weight:      Height:       No intake or output data in the 24 hours ending 03/20/21 0404 Last 3 Weights 03/20/2021 11/30/2020 11/26/2020  Weight (lbs) 170 lb 163 lb 3.2 oz (No Data)  Weight (kg) 77.111 kg 74.027 kg (No  Data)     Body mass index is 29.18 kg/m.  General:  NAD Neck: no JVD Vascular: No carotid bruits; FA pulses 2+ bilaterally without bruits  Cardiac:  normal S1, S2; RRR; no murmur  Lungs:  clear to auscultation bilaterally, no wheezing, rhonchi or rales  Abd: soft, nontender, no hepatomegaly  Ext: no edema Musculoskeletal:  No deformities, BUE and BLE strength normal and equal Skin: warm and dry  Neuro:  CNs 2-12 intact, no focal abnormalities noted Psych:  Normal affect   EKG:  The ECG that was done on arrival to the ED was personally reviewed and demonstrates ST elevation in the anterior precordial leads and aVL with reciprocal ST depression throughout  Relevant CV Studies: None  Laboratory Data:  High Sensitivity Troponin:  No results for input(s): TROPONINIHS in the last 720 hours.    ChemistryNo results for input(s): NA, K, CL, CO2, GLUCOSE, BUN, CREATININE, CALCIUM, GFRNONAA, GFRAA, ANIONGAP in the last 168 hours.  No results for input(s): PROT, ALBUMIN, AST, ALT, ALKPHOS, BILITOT in the last 168 hours. Hematology Recent Labs  Lab 03/20/21 0219  WBC 16.4*  RBC 5.08  HGB 15.7*  HCT 47.2*  MCV 92.9  MCH 30.9  MCHC 33.3  RDW 13.3  PLT 251   BNPNo results for input(s): BNP, PROBNP in the last 168 hours.  DDimer No results for input(s): DDIMER in the last 168 hours.   Radiology/Studies:  DG Chest Port 1 View  Result Date: 03/20/2021 CLINICAL DATA:  Chest pain EXAM: PORTABLE CHEST 1 VIEW COMPARISON:  07/12/2020 FINDINGS: Heart and mediastinal contours are within normal limits. No focal opacities or effusions. No acute bony abnormality. IMPRESSION: No active disease. Electronically Signed   By: Rolm Baptise M.D.   On: 03/20/2021 02:43      TIMI Risk Score for ST  Elevation MI:   The patient's TIMI risk score is 4, which indicates a 7.3% risk of all cause mortality at 30 days.    Assessment and Plan:  Kim Brown is a 70 y.o. female with HLD, hypothyroidism  presents with STEMI.   Patient found to have mid-LAD occlusion and 90% circ disease, both successfully treated with PCI. LVEDP 72mmHg and LVEF reduced by visual estimate on LVgram.   #) STEMI - check lipids, A1c - ASA 81mg  daily - rosuvastatin 20mg  QHS - metoprolol tartrate 12.5mg  BID - start ACE in AM - SLN, nitro gtt PRN - ticagrelor 90mg  BID - cardiac rehab - echo in AM  #) Hypothyroid - cont home thyroid (armour)  Severity of Illness: The appropriate patient status for this patient is INPATIENT. Inpatient status is judged to be reasonable and necessary in order to provide the required intensity of service to ensure the patient's safety. The patient's presenting symptoms, physical exam findings, and initial radiographic and laboratory data in the context of their chronic comorbidities is felt to place them at high risk for further clinical deterioration. Furthermore, it is not anticipated that the patient will be medically stable for discharge from the hospital within 2 midnights of admission. The following factors support the patient status of inpatient.   " The patient's presenting symptoms include chest pain. " The worrisome physical exam findings include n/a. " The initial radiographic and laboratory data are worrisome because of ST elevation on ECG. " The chronic co-morbidities include hypothyroid.   * I certify that at the point of admission it is my clinical judgment that the patient will require inpatient hospital care spanning beyond 2 midnights from the point of admission due to high intensity of service, high risk for further deterioration and high frequency of surveillance required.*   For questions or updates, please contact [place] Please consult [email or web address] for contact info under   Signed, Marcie Mowers, MD  03/20/2021 4:04 AM   I have examined the patient and reviewed  assessment and plan and discussed with patient.  Agree with above as stated.     I personally reviewed the ECG and made the decision for the patient to have emergent cardiac cath.  She was still having 6/10 chest pain upon arrival to the cath lab.  2+ right radial pulse.  Stable BP.    She had occluded mid LAD and severe mid to distal LAD disease, along with severe mid circ disease.   LAD and Circ were successfully stented with DES as noted in the cath report.  Two stents to LAD and one stent to the circumflex.    Mid LAD-1 lesion is 100% stenosed. After thrombectomy, a drug-eluting stent was successfully placed using a SYNERGY XD 3.0X16, postdilated to 3.75 mm.  Post intervention, there is a 0% residual stenosis.  Mid LAD-2 lesion is 95% stenosed. A drug-eluting stent was successfully placed using a SYNERGY XD 2.25X28, postdilated to 2.5 mm.  Post intervention, there is a 0% residual stenosis.  Mid Cx lesion is 90% stenosed. A drug-eluting stent was successfully placed using a SYNERGY XD 3.50X16, postdilated to 3.75 mm.  Post intervention, there is a 0% residual stenosis.    She needs aggressive secondary prevention.   Continue dual antiplatelet therapy for 12 months.  She had myalgias with lovastatin in 2013.  Start rosuvastatin 20 mg daily.  She will need metoprolol and likely an ACE inhibitor depending on her ejection fraction.  We will plan for echocardiogram on Monday.  No IV fluids due to elevated LVEDP in the setting of anterior wall MI and decreased LVEF.  May need PCSK9 inhibitor if she does not tolerate statin.  I discussed the findings with her husband.   Larae Grooms

## 2021-03-20 NOTE — ED Notes (Signed)
STEMI paged

## 2021-03-20 NOTE — ED Notes (Signed)
Pt taken to Cath lab

## 2021-03-20 NOTE — ED Triage Notes (Addendum)
Pt BIB EMS from home. Pt reports CP for the last hour that radiates to her mouth/jaw. EMS reports Pt took 324 aspirin to see if it would help - no relief. Pt also endorses nausea, gas, diaphoresis, and dizziness.  Pt recently had her R shoulder replaced in August.  VS with EMS  BP 116/70 HR 75 NSR  O2 100% on RA  Temp 97.6

## 2021-03-20 NOTE — Progress Notes (Signed)
Progress Note  Patient Name: Kim Brown Date of Encounter: 03/20/2021  [facility] Cardiologist: No primary care provider on file.  [place]  Subjective   Presented with STEMI, jaw pain epigastric symptoms nausea took aspirin at home.  ST elevation anterolateral distribution with reciprocal ST depression.  Post catheterization LAD and circumflex stent.  Feels a little nervous this morning.  No chest pain no shortness of breath.  Inpatient Medications    Scheduled Meds: . aspirin  81 mg Oral Daily  . Chlorhexidine Gluconate Cloth  6 each Topical Q0600  . metoprolol tartrate  12.5 mg Oral BID  . omega-3 acid ethyl esters  1 g Oral Daily  . rosuvastatin  20 mg Oral Daily  . sodium chloride flush  3 mL Intravenous Q12H  . thyroid  60 mg Oral QHS  . thyroid  90 mg Oral Daily  . ticagrelor  90 mg Oral BID   Continuous Infusions: . sodium chloride 75 mL/hr at 03/20/21 0249  . sodium chloride     PRN Meds: sodium chloride, acetaminophen, hydrALAZINE, labetalol, nitroGLYCERIN, ondansetron (ZOFRAN) IV, sodium chloride flush   Vital Signs    Vitals:   03/20/21 0600 03/20/21 0700 03/20/21 0718 03/20/21 0800  BP: 97/63 103/70  104/67  Pulse: 90 85  97  Resp: 20 10  16   Temp:   98.3 F (36.8 C)   TempSrc:   Oral   SpO2: 95% 96%  97%  Weight:      Height:        Intake/Output Summary (Last 24 hours) at 03/20/2021 1012 Last data filed at 03/20/2021 0600 Gross per 24 hour  Intake 5.46 ml  Output --  Net 5.46 ml   Last 3 Weights 03/20/2021 03/20/2021 11/30/2020  Weight (lbs) 166 lb 10.7 oz 170 lb 163 lb 3.2 oz  Weight (kg) 75.6 kg 77.111 kg 74.027 kg      Telemetry    Sinus rhythm, brief 4, 5, longest 8 beats runs of nonsustained ventricular tachycardia.  Rare PVCs.- Personally Reviewed  ECG    Sinus rhythm with poor R wave progression, nonspecific ST-T wave changes, resolution of ST elevation- Personally Reviewed  Physical Exam   GEN: No acute distress.   Neck:  No JVD Cardiac: RRR, no murmurs, rubs, or gallops.  Respiratory: Clear to auscultation bilaterally. GI: Soft, nontender, non-distended  MS: No edema; No deformity. Neuro:  Nonfocal  Psych: Normal affect   Labs    High Sensitivity Troponin:   Recent Labs  Lab 03/20/21 0219 03/20/21 0549  TROPONINIHS 243* 7,259*      Chemistry Recent Labs  Lab 03/20/21 0219  NA 135  K 4.7  CL 101  CO2 20*  GLUCOSE 181*  BUN 16  CREATININE 1.05*  CALCIUM 9.6  PROT 6.7  ALBUMIN 3.9  AST 50*  ALT 16  ALKPHOS 60  BILITOT 0.9  GFRNONAA 58*  ANIONGAP 14     Hematology Recent Labs  Lab 03/20/21 0219  WBC 16.4*  RBC 5.08  HGB 15.7*  HCT 47.2*  MCV 92.9  MCH 30.9  MCHC 33.3  RDW 13.3  PLT 251    BNPNo results for input(s): BNP, PROBNP in the last 168 hours.   DDimer No results for input(s): DDIMER in the last 168 hours.   Radiology    CARDIAC CATHETERIZATION  Result Date: 03/20/2021  Mid LAD-1 lesion is 100% stenosed. After thrombectomy, a drug-eluting stent was successfully placed using a SYNERGY XD 3.0X16, postdilated to 3.75  mm.  Post intervention, there is a 0% residual stenosis.  Mid LAD-2 lesion is 95% stenosed. A drug-eluting stent was successfully placed using a SYNERGY XD 2.25X28, postdilated to 2.5 mm.  Post intervention, there is a 0% residual stenosis.  Mid Cx lesion is 90% stenosed. A drug-eluting stent was successfully placed using a SYNERGY XD 3.50X16, postdilated to 3.75 mm.  Post intervention, there is a 0% residual stenosis.  2nd Diag lesion is 50% stenosed.  Dist LAD lesion is 25% stenosed. The mid to distal LAD was a very small vessel in general.  There is mild to moderate left ventricular systolic dysfunction.  LV end diastolic pressure is moderately elevated. LVEDP 23 mm Hg.  The left ventricular ejection fraction is 35-45% by visual estimate.  There is no aortic valve stenosis.  Continue dual antiplatelet therapy for 12 months.  She will need  aggressive secondary prevention.  She had myalgias with lovastatin.  Start rosuvastatin 20 mg daily.  She will need metoprolol and likely an ACE inhibitor depending on her ejection fraction.  We will plan for echocardiogram on Monday.  No IV fluids due to elevated LVEDP in the setting of anterior wall MI and decreased LVEF. I discussed the findings with her husband.   DG Chest Port 1 View  Result Date: 03/20/2021 CLINICAL DATA:  Chest pain EXAM: PORTABLE CHEST 1 VIEW COMPARISON:  07/12/2020 FINDINGS: Heart and mediastinal contours are within normal limits. No focal opacities or effusions. No acute bony abnormality. IMPRESSION: No active disease. Electronically Signed   By: Rolm Baptise M.D.   On: 03/20/2021 02:43    Cardiac Studies   Cath Diagnostic Dominance: Left    Intervention      Patient Profile     70 y.o. female with STEMI anterolateral LAD and circumflex stent placement with ischemic cardiomyopathy EF 35 to 45% on cath  Assessment & Plan    Anterolateral STEMI -LAD and circumflex stent.  Dual antiplatelet therapy.  High intensity statin, Crestor 20.  EL 163 beta-blocker, 12.5 mg twice daily. -Cardiac rehab. -Echocardiogram. -BP 104/67 at last check.  I will hold off on ACE inhibitor or ARB today. -Brief NSVT noted on telemetry.  I will increase metoprolol to 25 mg twice a day -Peak troponin 7300  Ischemic cardiomyopathy -EF 35 to 45% on catheterization left ventriculogram.  Checking echocardiogram.  Ultimately would benefit from beta-blocker, Entresto, potential spironolactone and SGLT2 inhibitor.  Hopefully EF will improve post PCI.  Repeat in outpatient setting.  Hypothyroidism -Continue home thyroid medication.  CRITICAL CARE Performed by: Candee Furbish   Total critical care time: 35 minutes  Critical care time was exclusive of separately billable procedures and treating other patients.  Critical care was necessary to treat or prevent imminent or  life-threatening deterioration.  Critical care was time spent personally by me on the following activities: development of treatment plan with patient and/or surrogate as well as nursing, discussions with consultants, evaluation of patient's response to treatment, examination of patient, obtaining history from patient or surrogate, ordering and performing treatments and interventions, ordering and review of laboratory studies, ordering and review of radiographic studies, pulse oximetry and re-evaluation of patient's condition.   For questions or updates, please contact [place] Please consult [email or web address] for contact info under        Signed, Candee Furbish, MD  03/20/2021, 10:12 AM

## 2021-03-21 ENCOUNTER — Other Ambulatory Visit (HOSPITAL_COMMUNITY): Payer: Self-pay

## 2021-03-21 ENCOUNTER — Encounter (HOSPITAL_COMMUNITY): Payer: Self-pay | Admitting: Interventional Cardiology

## 2021-03-21 DIAGNOSIS — E782 Mixed hyperlipidemia: Secondary | ICD-10-CM

## 2021-03-21 DIAGNOSIS — I519 Heart disease, unspecified: Secondary | ICD-10-CM

## 2021-03-21 LAB — BASIC METABOLIC PANEL
Anion gap: 7 (ref 5–15)
BUN: 10 mg/dL (ref 8–23)
CO2: 26 mmol/L (ref 22–32)
Calcium: 8.6 mg/dL — ABNORMAL LOW (ref 8.9–10.3)
Chloride: 107 mmol/L (ref 98–111)
Creatinine, Ser: 0.91 mg/dL (ref 0.44–1.00)
GFR, Estimated: >60 mL/min (ref >60)
Glucose, Bld: 107 mg/dL — ABNORMAL HIGH (ref 70–99)
Potassium: 3.8 mmol/L (ref 3.5–5.1)
Sodium: 140 mmol/L (ref 135–145)

## 2021-03-21 LAB — POCT I-STAT, CHEM 8
BUN: 17 mg/dL (ref 8–23)
Calcium, Ion: 1.19 mmol/L (ref 1.15–1.40)
Chloride: 101 mmol/L (ref 98–111)
Creatinine, Ser: 0.7 mg/dL (ref 0.44–1.00)
Glucose, Bld: 209 mg/dL — ABNORMAL HIGH (ref 70–99)
HCT: 40 % (ref 36.0–46.0)
Hemoglobin: 13.6 g/dL (ref 12.0–15.0)
Potassium: 3.3 mmol/L — ABNORMAL LOW (ref 3.5–5.1)
Sodium: 136 mmol/L (ref 135–145)
TCO2: 21 mmol/L — ABNORMAL LOW (ref 22–32)

## 2021-03-21 LAB — CBC
HCT: 39.7 % (ref 36.0–46.0)
Hemoglobin: 13.2 g/dL (ref 12.0–15.0)
MCH: 31.1 pg (ref 26.0–34.0)
MCHC: 33.2 g/dL (ref 30.0–36.0)
MCV: 93.6 fL (ref 80.0–100.0)
Platelets: 260 10*3/uL (ref 150–400)
RBC: 4.24 MIL/uL (ref 3.87–5.11)
RDW: 13.7 % (ref 11.5–15.5)
WBC: 11.7 10*3/uL — ABNORMAL HIGH (ref 4.0–10.5)
nRBC: 0 % (ref 0.0–0.2)

## 2021-03-21 LAB — TROPONIN I (HIGH SENSITIVITY): Troponin I (High Sensitivity): 19551 ng/L (ref <18)

## 2021-03-21 MED ORDER — ROSUVASTATIN CALCIUM 20 MG PO TABS
40.0000 mg | ORAL_TABLET | Freq: Every day | ORAL | Status: DC
  Administered 2021-03-22: 40 mg via ORAL
  Filled 2021-03-21: qty 2

## 2021-03-21 MED FILL — Heparin Sod (Porcine)-NaCl IV Soln 1000 Unit/500ML-0.9%: INTRAVENOUS | Qty: 500 | Status: AC

## 2021-03-21 MED FILL — Perflutren Lipid Microsphere IV Susp 1.1 MG/ML: INTRAVENOUS | Qty: 10 | Status: AC

## 2021-03-21 NOTE — Plan of Care (Signed)
  Problem: Education: Goal: Knowledge of General Education information will improve Description: Including pain rating scale, medication(s)/side effects and non-pharmacologic comfort measures 03/21/2021 2020 by Harl Bowie, RN Outcome: Progressing 03/21/2021 2019 by Harl Bowie, RN Outcome: Progressing   Problem: Health Behavior/Discharge Planning: Goal: Ability to manage health-related needs will improve 03/21/2021 2020 by Harl Bowie, RN Outcome: Progressing 03/21/2021 2019 by Harl Bowie, RN Outcome: Progressing   Problem: Clinical Measurements: Goal: Ability to maintain clinical measurements within normal limits will improve 03/21/2021 2020 by Harl Bowie, RN Outcome: Progressing 03/21/2021 2019 by Harl Bowie, RN Outcome: Progressing Goal: Will remain free from infection 03/21/2021 2020 by Harl Bowie, RN Outcome: Progressing 03/21/2021 2019 by Harl Bowie, RN Outcome: Progressing Goal: Diagnostic test results will improve 03/21/2021 2020 by Harl Bowie, RN Outcome: Progressing 03/21/2021 2019 by Harl Bowie, RN Outcome: Progressing Goal: Respiratory complications will improve 03/21/2021 2020 by Harl Bowie, RN Outcome: Progressing 03/21/2021 2019 by Harl Bowie, RN Outcome: Progressing Goal: Cardiovascular complication will be avoided 03/21/2021 2020 by Harl Bowie, RN Outcome: Progressing 03/21/2021 2019 by Harl Bowie, RN Outcome: Progressing   Problem: Activity: Goal: Risk for activity intolerance will decrease 03/21/2021 2020 by Harl Bowie, RN Outcome: Progressing 03/21/2021 2019 by Harl Bowie, RN Outcome: Progressing   Problem: Nutrition: Goal: Adequate nutrition will be maintained 03/21/2021 2020 by Harl Bowie, RN Outcome: Progressing 03/21/2021 2019 by Harl Bowie, RN Outcome: Progressing   Problem: Coping: Goal: Level of anxiety will decrease 03/21/2021 2020 by Harl Bowie, RN Outcome: Progressing 03/21/2021 2019 by Harl Bowie, RN Outcome: Progressing   Problem: Elimination: Goal: Will not experience complications related to bowel motility 03/21/2021 2020 by Harl Bowie, RN Outcome: Progressing 03/21/2021 2019 by Harl Bowie, RN Outcome: Progressing Goal: Will not experience complications related to urinary retention 03/21/2021 2020 by Harl Bowie, RN Outcome: Progressing 03/21/2021 2019 by Harl Bowie, RN Outcome: Progressing   Problem: Pain Managment: Goal: General experience of comfort will improve 03/21/2021 2020 by Harl Bowie, RN Outcome: Progressing 03/21/2021 2019 by Harl Bowie, RN Outcome: Progressing   Problem: Safety: Goal: Ability to remain free from injury will improve 03/21/2021 2020 by Harl Bowie, RN Outcome: Progressing 03/21/2021 2019 by Harl Bowie, RN Outcome: Progressing   Problem: Skin Integrity: Goal: Risk for impaired skin integrity will decrease 03/21/2021 2020 by Harl Bowie, RN Outcome: Progressing 03/21/2021 2019 by Harl Bowie, RN Outcome: Progressing   Problem: Education: Goal: Understanding of CV disease, CV risk reduction, and recovery process will improve Outcome: Progressing Goal: Individualized Educational Video(s) Outcome: Progressing   Problem: Activity: Goal: Ability to return to baseline activity level will improve Outcome: Progressing   Problem: Cardiovascular: Goal: Ability to achieve and maintain adequate cardiovascular perfusion will improve Outcome: Progressing Goal: Vascular access site(s) Level 0-1 will be maintained Outcome: Progressing   Problem: Health Behavior/Discharge Planning: Goal: Ability to safely manage health-related needs after discharge will improve Outcome: Progressing

## 2021-03-21 NOTE — TOC Benefit Eligibility Note (Signed)
Patient Teacher, English as a foreign language completed.    The patient is currently admitted and upon discharge could be taking Brilinta 90 mg.  The current 30 day co-pay is, $45.00.   The patient is insured through [employer] Part D    Lyndel Safe, [place] Patient Advocate Specialist [place] Team Direct Number: [phone]  Fax: [phone]

## 2021-03-21 NOTE — Progress Notes (Signed)
CARDIAC REHAB PHASE I   PRE:  Rate/Rhythm: 93 SR  BP:  Supine:   Sitting: 111/63  Standing:    SaO2: 99%RA  MODE:  Ambulation: 740 ft   POST:  Rate/Rhythm: 114 ST  BP:  Supine: 127/84  Sitting:   Standing:    SaO2: 99%RA 5929-2446 Pt walked 740 ft on RA with steady gait and no CP. Tolerated well. MI education completed with pt who voiced understanding. Stressed importance of brilinta with stent. Reviewed NTG use, MI restrictions, walking for exercise, gave heart healthy diet and discussed CRP 2. Referred to [place] program. Back to bed after walk. Second walk today.   Graylon Good, RN BSN  03/21/2021 9:21 AM

## 2021-03-21 NOTE — Progress Notes (Addendum)
Progress Note  Patient Name: Kim Brown Date of Encounter: 03/21/2021  [facility] Cardiologist: No primary care provider on file. new- Kim Brown  Subjective   No further chest discomfort.  She walked with cardiac rehab and tolerated this well.  She is able to talk while walking with minimal shortness of breath.  Inpatient Medications    Scheduled Meds: . aspirin  81 mg Oral Daily  . Chlorhexidine Gluconate Cloth  6 each Topical Q0600  . metoprolol tartrate  25 mg Oral BID  . omega-3 acid ethyl esters  1 g Oral Daily  . rosuvastatin  20 mg Oral Daily  . sodium chloride flush  3 mL Intravenous Q12H  . thyroid  60 mg Oral QHS  . thyroid  90 mg Oral Daily  . ticagrelor  90 mg Oral BID   Continuous Infusions: . sodium chloride 75 mL/hr at 03/20/21 0249  . sodium chloride     PRN Meds: sodium chloride, acetaminophen, nitroGLYCERIN, ondansetron (ZOFRAN) IV, sodium chloride flush   Vital Signs    Vitals:   03/21/21 0600 03/21/21 0605 03/21/21 0625 03/21/21 0726  BP:  119/73    Pulse: 74 86    Resp: 12 16    Temp:    98.2 F (36.8 C)  TempSrc:    Oral  SpO2: 96% 97%    Weight:   72.5 kg   Height:        Intake/Output Summary (Last 24 hours) at 03/21/2021 0828 Last data filed at 03/21/2021 0630 Gross per 24 hour  Intake 183 ml  Output --  Net 183 ml   Last 3 Weights 03/21/2021 03/20/2021 03/20/2021  Weight (lbs) 159 lb 14.4 oz 166 lb 10.7 oz 170 lb  Weight (kg) 72.53 kg 75.6 kg 77.111 kg      Telemetry    Normal sinus rhythm- Personally Reviewed  ECG    Normal sinus rhythm, resolution of ST elevation but T wave inversions in anterior Q waves present.  Persistent inferior ST depression and poor R wave progression noted- Personally Reviewed  Physical Exam   GEN: No acute distress.   Neck: No JVD Cardiac: RRR, no murmurs, rubs, or gallops.  Respiratory: Clear to auscultation bilaterally. GI: Soft, nontender, non-distended  MS: No edema; No deformity.  No  right radial hematoma Neuro:  Nonfocal  Psych: Normal affect   Labs    High Sensitivity Troponin:   Recent Labs  Lab 03/20/21 0219 03/20/21 0549 03/21/21 0536  TROPONINIHS 243* 7,259* 19,551*      Chemistry Recent Labs  Lab 03/20/21 0219 03/21/21 0322  NA 135 140  K 4.7 3.8  CL 101 107  CO2 20* 26  GLUCOSE 181* 107*  BUN 16 10  CREATININE 1.05* 0.91  CALCIUM 9.6 8.6*  PROT 6.7  --   ALBUMIN 3.9  --   AST 50*  --   ALT 16  --   ALKPHOS 60  --   BILITOT 0.9  --   GFRNONAA 58* >60  ANIONGAP 14 7     Hematology Recent Labs  Lab 03/20/21 0219 03/21/21 0322  WBC 16.4* 11.7*  RBC 5.08 4.24  HGB 15.7* 13.2  HCT 47.2* 39.7  MCV 92.9 93.6  MCH 30.9 31.1  MCHC 33.3 33.2  RDW 13.3 13.7  PLT 251 260    BNPNo results for input(s): BNP, PROBNP in the last 168 hours.   DDimer No results for input(s): DDIMER in the last 168 hours.   Radiology  CARDIAC CATHETERIZATION  Result Date: 03/20/2021  Mid LAD-1 lesion is 100% stenosed. After thrombectomy, a drug-eluting stent was successfully placed using a SYNERGY XD 3.0X16, postdilated to 3.75 mm.  Post intervention, there is a 0% residual stenosis.  Mid LAD-2 lesion is 95% stenosed. A drug-eluting stent was successfully placed using a SYNERGY XD 2.25X28, postdilated to 2.5 mm.  Post intervention, there is a 0% residual stenosis.  Mid Cx lesion is 90% stenosed. A drug-eluting stent was successfully placed using a SYNERGY XD 3.50X16, postdilated to 3.75 mm.  Post intervention, there is a 0% residual stenosis.  2nd Diag lesion is 50% stenosed.  Dist LAD lesion is 25% stenosed. The mid to distal LAD was a very small vessel in general.  There is mild to moderate left ventricular systolic dysfunction.  LV end diastolic pressure is moderately elevated. LVEDP 23 mm Hg.  The left ventricular ejection fraction is 35-45% by visual estimate.  There is no aortic valve stenosis.  Continue dual antiplatelet therapy for 12 months.   She will need aggressive secondary prevention.  She had myalgias with lovastatin.  Start rosuvastatin 20 mg daily.  She will need metoprolol and likely an ACE inhibitor depending on her ejection fraction.  We will plan for echocardiogram on Monday.  No IV fluids due to elevated LVEDP in the setting of anterior wall MI and decreased LVEF. I discussed the findings with her husband.   DG Chest Port 1 View  Result Date: 03/20/2021 CLINICAL DATA:  Chest pain EXAM: PORTABLE CHEST 1 VIEW COMPARISON:  07/12/2020 FINDINGS: Heart and mediastinal contours are within normal limits. No focal opacities or effusions. No acute bony abnormality. IMPRESSION: No active disease. Electronically Signed   By: Rolm Baptise M.D.   On: 03/20/2021 02:43   ECHOCARDIOGRAM COMPLETE  Result Date: 03/20/2021    ECHOCARDIOGRAM REPORT   Patient Name:   Kim Brown Date of Exam: 03/20/2021 Medical Rec #:  [id number]     Height:       64.0 in Accession #:    [id number]    Weight:       166.7 lb Date of Birth:  August 24, 1951      BSA:          1.811 m Patient Age:    70 years      BP:           104/67 mmHg Patient Gender: F             HR:           97 bpm. Exam Location:  Inpatient Procedure: 2D Echo, Cardiac Doppler and Color Doppler Indications:    Acute myocardial infarction  History:        Patient has no prior history of Echocardiogram examinations.                 Risk Factors:Hypertension and Dyslipidemia.  Sonographer:    Clayton Lefort RDCS (AE) Referring Phys: [id number] MARK C SKAINS IMPRESSIONS  1. Left ventricular ejection fraction, by estimation, is 40 to 45%. The left ventricle has mildly decreased function. The left ventricle has no regional wall motion abnormalities. There is mild left ventricular hypertrophy. Left ventricular diastolic parameters are consistent with Grade I diastolic dysfunction (impaired relaxation).  2. Right ventricular systolic function is normal. The right ventricular size is normal. There is mildly elevated pulmonary  artery systolic pressure.  3. The mitral valve is normal in structure. No evidence of mitral valve regurgitation. No evidence of mitral  stenosis.  4. The aortic valve is normal in structure. Aortic valve regurgitation is not visualized. No aortic stenosis is present.  5. The inferior vena cava is dilated in size with <50% respiratory variability, suggesting right atrial pressure of 15 mmHg. FINDINGS  Left Ventricle: Left ventricular ejection fraction, by estimation, is 40 to 45%. The left ventricle has mildly decreased function. The left ventricle has no regional wall motion abnormalities. Definity contrast agent was given IV to delineate the left ventricular endocardial borders. The left ventricular internal cavity size was normal in size. There is mild left ventricular hypertrophy. Left ventricular diastolic parameters are consistent with Grade I diastolic dysfunction (impaired relaxation).  LV Wall Scoring: The apical inferior segment and apex are akinetic. The entire anterior septum and mid inferoseptal segment are hypokinetic. Right Ventricle: The right ventricular size is normal. No increase in right ventricular wall thickness. Right ventricular systolic function is normal. There is mildly elevated pulmonary artery systolic pressure. The tricuspid regurgitant velocity is 2.45  m/s, and with an assumed right atrial pressure of 15 mmHg, the estimated right ventricular systolic pressure is 81.8 mmHg. Left Atrium: Left atrial size was normal in size. Right Atrium: Right atrial size was normal in size. Pericardium: There is no evidence of pericardial effusion. Mitral Valve: The mitral valve is normal in structure. No evidence of mitral valve regurgitation. No evidence of mitral valve stenosis. MV peak gradient, 6.8 mmHg. The mean mitral valve gradient is 2.0 mmHg. Tricuspid Valve: The tricuspid valve is normal in structure. Tricuspid valve regurgitation is trivial. No evidence of tricuspid stenosis. Aortic Valve:  The aortic valve is normal in structure. Aortic valve regurgitation is not visualized. No aortic stenosis is present. Aortic valve mean gradient measures 4.0 mmHg. Aortic valve peak gradient measures 6.8 mmHg. Aortic valve area, by VTI measures 2.46 cm. Pulmonic Valve: The pulmonic valve was normal in structure. Pulmonic valve regurgitation is not visualized. No evidence of pulmonic stenosis. Aorta: The aortic root is normal in size and structure. Venous: The inferior vena cava is dilated in size with less than 50% respiratory variability, suggesting right atrial pressure of 15 mmHg. IAS/Shunts: No atrial level shunt detected by color flow Doppler.  LEFT VENTRICLE PLAX 2D LVIDd:         4.20 cm      Diastology LVIDs:         3.20 cm      LV e' medial:    5.87 cm/s LV PW:         1.20 cm      LV E/e' medial:  12.5 LV IVS:        1.10 cm      LV e' lateral:   6.85 cm/s LVOT diam:     1.85 cm      LV E/e' lateral: 10.7 LV SV:         61 LV SV Index:   34 LVOT Area:     2.69 cm  LV Volumes (MOD) LV vol d, MOD A2C: 105.0 ml LV vol d, MOD A4C: 78.6 ml LV vol s, MOD A2C: 60.7 ml LV vol s, MOD A4C: 38.9 ml LV SV MOD A2C:     44.3 ml LV SV MOD A4C:     78.6 ml LV SV MOD BP:      44.0 ml RIGHT VENTRICLE             IVC RV Basal diam:  2.60 cm     IVC diam: 2.20  cm RV S prime:     17.60 cm/s TAPSE (M-mode): 2.2 cm LEFT ATRIUM             Index       RIGHT ATRIUM           Index LA diam:        2.80 cm 1.55 cm/m  RA Area:     11.40 cm LA Vol (A2C):   30.3 ml 16.74 ml/m RA Volume:   26.20 ml  14.47 ml/m LA Vol (A4C):   24.8 ml 13.70 ml/m LA Biplane Vol: 28.1 ml 15.52 ml/m  AORTIC VALVE AV Area (Vmax):    2.54 cm AV Area (Vmean):   1.99 cm AV Area (VTI):     2.46 cm AV Vmax:           130.00 cm/s AV Vmean:          93.200 cm/s AV VTI:            0.249 m AV Peak Grad:      6.8 mmHg AV Mean Grad:      4.0 mmHg LVOT Vmax:         123.00 cm/s LVOT Vmean:        69.100 cm/s LVOT VTI:          0.228 m LVOT/AV VTI ratio: 0.92   AORTA Ao Root diam: 3.10 cm Ao Asc diam:  3.30 cm MITRAL VALVE                TRICUSPID VALVE MV Area (PHT): 3.37 cm     TR Peak grad:   24.0 mmHg MV Area VTI:   2.68 cm     TR Vmax:        245.00 cm/s MV Peak grad:  6.8 mmHg MV Mean grad:  2.0 mmHg     SHUNTS MV Vmax:       1.30 m/s     Systemic VTI:  0.23 m MV Vmean:      65.1 cm/s    Systemic Diam: 1.85 cm MV Decel Time: 225 msec MV E velocity: 73.10 cm/s MV A velocity: 118.00 cm/s MV E/A ratio:  0.62 Candee Furbish MD Electronically signed by Candee Furbish MD Signature Date/Time: 03/20/2021/4:27:59 PM    Final     Cardiac Studies   Echo as noted below, mild LV dysfunction with normal valvular function  Patient Profile     70 y.o. female with anterior MI  Assessment & Plan    1) CAD/anterior MI: Status post PCI of the LAD and the circumflex.  Continue dual antiplatelet therapy.  Brilinta will be $45 for 30 days.  Could switch to clopidogrel from Brilinta after a month if cost is an issue.  LVEF 40 to 45% by echocardiogram.  We will see if blood pressure increases.  Consider adding low-dose ACE inhibitor.  Continue low-dose beta-blocker at this time. 2) hyperlipidemia: She was intolerant of lovastatin in the past.  Rosuvastatin has been started-increased to 40 mg daily..  Will need to get LDL below 70.  LDL 163 on morning of MI.  If she does not tolerate statin or we cannot get her down to target, would need to consider PCSK9 inhibitor. 3) whole food, plant-based diet recommended.  She would benefit from cardiac rehab.  She feels that her diet has been pretty healthy for the most part.  She is agreeable to the statin. 4) transfer to telemetry today.  For questions or updates, please  contact [facility] Please consult [email or web address] for contact info under        Signed, Larae Grooms, MD  03/21/2021, 8:28 AM

## 2021-03-22 ENCOUNTER — Other Ambulatory Visit (HOSPITAL_COMMUNITY): Payer: Self-pay

## 2021-03-22 ENCOUNTER — Encounter (HOSPITAL_COMMUNITY): Payer: Self-pay | Admitting: Interventional Cardiology

## 2021-03-22 DIAGNOSIS — I5021 Acute systolic (congestive) heart failure: Secondary | ICD-10-CM

## 2021-03-22 MED ORDER — TICAGRELOR 90 MG PO TABS
90.0000 mg | ORAL_TABLET | Freq: Two times a day (BID) | ORAL | 11 refills | Status: DC
  Filled 2021-03-22: qty 60, 30d supply, fill #0

## 2021-03-22 MED ORDER — METOPROLOL TARTRATE 25 MG PO TABS
25.0000 mg | ORAL_TABLET | Freq: Two times a day (BID) | ORAL | 3 refills | Status: DC
  Filled 2021-03-22: qty 60, 30d supply, fill #0

## 2021-03-22 MED ORDER — ASPIRIN 81 MG PO TBEC
81.0000 mg | DELAYED_RELEASE_TABLET | Freq: Every day | ORAL | 11 refills | Status: AC
  Filled 2021-03-22: qty 30, 30d supply, fill #0

## 2021-03-22 MED ORDER — ROSUVASTATIN CALCIUM 40 MG PO TABS
40.0000 mg | ORAL_TABLET | Freq: Every day | ORAL | 6 refills | Status: DC
  Filled 2021-03-22: qty 30, 30d supply, fill #0

## 2021-03-22 MED ORDER — NITROGLYCERIN 0.4 MG SL SUBL
0.4000 mg | SUBLINGUAL_TABLET | SUBLINGUAL | 3 refills | Status: DC | PRN
  Filled 2021-03-22: qty 25, 7d supply, fill #0

## 2021-03-22 NOTE — Discharge Summary (Addendum)
Discharge Summary    Patient ID: Kim Brown MRN: [id number]; DOB: 06/16/1951  Admit date: 03/20/2021 Discharge date: 03/22/2021  PCP:  Tonia Ghent, MD   [place]  Cardiologist:  Larae Grooms, MD  Advanced Practice Provider:  No care team member to display Electrophysiologist:  None   :[id number]}  Discharge Diagnoses    Principal Problem:   Acute anterior wall MI [facility]) Active Problems:   Hyperlipidemia   Hypothyroidism   Acute systolic heart failure [facility])    Diagnostic Studies/Procedures    Cath: 03/20/21    Mid LAD-1 lesion is 100% stenosed. After thrombectomy, a drug-eluting stent was successfully placed using a SYNERGY XD 3.0X16, postdilated to 3.75 mm.  Post intervention, there is a 0% residual stenosis.  Mid LAD-2 lesion is 95% stenosed. A drug-eluting stent was successfully placed using a SYNERGY XD 2.25X28, postdilated to 2.5 mm.  Post intervention, there is a 0% residual stenosis.  Mid Cx lesion is 90% stenosed. A drug-eluting stent was successfully placed using a SYNERGY XD 3.50X16, postdilated to 3.75 mm.  Post intervention, there is a 0% residual stenosis.  2nd Diag lesion is 50% stenosed.  Dist LAD lesion is 25% stenosed. The mid to distal LAD was a very small vessel in general.  There is mild to moderate left ventricular systolic dysfunction.  LV end diastolic pressure is moderately elevated. LVEDP 23 mm Hg.  The left ventricular ejection fraction is 35-45% by visual estimate.  There is no aortic valve stenosis.   Continue dual antiplatelet therapy for 12 months.  She will need aggressive secondary prevention.  She had myalgias with lovastatin.  Start rosuvastatin 20 mg daily.  She will need metoprolol and likely an ACE inhibitor depending on her ejection fraction.  We will plan for echocardiogram on Monday.  No IV fluids due to elevated LVEDP in the setting of anterior wall MI and decreased LVEF.  I discussed  the findings with her husband.  Diagnostic Dominance: Left    Intervention    Echo: 03/20/21  IMPRESSIONS    1. Left ventricular ejection fraction, by estimation, is 40 to 45%. The  left ventricle has mildly decreased function. The left ventricle has no  regional wall motion abnormalities. There is mild left ventricular  hypertrophy. Left ventricular diastolic  parameters are consistent with Grade I diastolic dysfunction (impaired  relaxation).  2. Right ventricular systolic function is normal. The right ventricular  size is normal. There is mildly elevated pulmonary artery systolic  pressure.  3. The mitral valve is normal in structure. No evidence of mitral valve  regurgitation. No evidence of mitral stenosis.  4. The aortic valve is normal in structure. Aortic valve regurgitation is  not visualized. No aortic stenosis is present.  5. The inferior vena cava is dilated in size with <50% respiratory  variability, suggesting right atrial pressure of 15 mmHg.  _____________   History of Present Illness     Kim Brown is a 70 y.o. female with PMH of HLD, hypothyroidism who presented with STEMI. Ms. Geisel reported waxing and waning chest and jaw pain throughout the day prior to admission.  This started shortly after waking up.  Throughout the day this worsened and she developed epigastric symptoms with some nausea.  At approximately 10 PM, the symptoms became much worse and she eventually called EMS.  She took 324 mg of aspirin at home.  On arrival to the emergency department her vital signs were all within normal limits.  An ECG revealed ST elevation in the anterolateral distribution with reciprocal ST depression.  She was taken to the Cath Lab urgently for STEMI.  Hospital Course     1. Anterior STEMI: hsTn peaked at 19551. Underwent cardiac cath noted above with total of 3 lesions treated as follows: 1.) mLAD of 100% lesion which was successfully treated with PCI/DESx1  along with thrombectomy 2.) mLAD of 95% with PCI/DESx1 3.) mLCx of 90% with PCI/DESx1 -- does have residual mild to moderate non-obstructive in 2nd diag, and distal LAD to be treated medically -- placed on DAPT with ASA/Brilinta for at least one year -- was able to work with CR without recurrent chest pain  2. HLD: Noted issues with myalgias with lovastatin in the past. Agreeable to start on Crestor 40mg  daily along with Omega # -- LDL 163 -- FLP/LFTs in 8 weeks -- suspect will need to consider for PCSK9s as an outpatient  3. ICM: EF noted at 40-45% with no rWMA noted, G1DD.  -- tolerated the addition of low dose BB, but blood pressures to soft to add ACE/ARB -- consider at follow up appt  4. Hypothyroidism: continued on Armour thyroid  Patient was seen by Dr. Irish Lack and deemed stable for discharge home. Follow up in the office has been arranged. Medications sent to the [facility] pharmacy.  Did the patient have an acute coronary syndrome (MI, NSTEMI, STEMI, etc) this admission?:  Yes                               AHA/ACC Clinical Performance & Quality Measures: 1. Aspirin prescribed? - Yes 2. ADP Receptor Inhibitor (Plavix/Clopidogrel, Brilinta/Ticagrelor or Effient/Prasugrel) prescribed (includes medically managed patients)? - Yes 3. Beta Blocker prescribed? - Yes 4. High Intensity Statin (Lipitor 40-80mg  or Crestor 20-40mg ) prescribed? - Yes 5. EF assessed during THIS hospitalization? - Yes 6. For EF <40%, was ACEI/ARB prescribed? - Not Applicable (EF >/= 50%) 7. For EF <40%, Aldosterone Antagonist (Spironolactone or Eplerenone) prescribed? - Not Applicable (EF >/= 35%) 8. Cardiac Rehab Phase II ordered (including medically managed patients)? - Yes  _____________  Discharge Vitals Blood pressure 135/78, pulse 99, temperature 98.6 F (37 C), temperature source Oral, resp. rate 17, height 5\' 4"  (1.626 m), weight 71.7 kg, SpO2 95 %.  Filed Weights   03/20/21 0430 03/21/21 0625 03/22/21  0628  Weight: 75.6 kg 72.5 kg 71.7 kg    Labs & Radiologic Studies    CBC Recent Labs    03/20/21 0219 03/20/21 0307 03/21/21 0322  WBC 16.4*  --  11.7*  NEUTROABS 12.1*  --   --   HGB 15.7* 13.6 13.2  HCT 47.2* 40.0 39.7  MCV 92.9  --  93.6  PLT 251  --  465   Basic Metabolic Panel Recent Labs    03/20/21 0219 03/20/21 0307 03/20/21 0627 03/21/21 0322  NA 135 136  --  140  K 4.7 3.3*  --  3.8  CL 101 101  --  107  CO2 20*  --   --  26  GLUCOSE 181* 209*  --  107*  BUN 16 17  --  10  CREATININE 1.05* 0.70  --  0.91  CALCIUM 9.6  --   --  8.6*  MG  --   --  2.1  --    Liver Function Tests Recent Labs    03/20/21 0219  AST 50*  ALT 16  ALKPHOS 60  BILITOT 0.9  PROT 6.7  ALBUMIN 3.9   No results for input(s): LIPASE, AMYLASE in the last 72 hours. High Sensitivity Troponin:   Recent Labs  Lab 03/20/21 0219 03/20/21 0549 03/21/21 0536  TROPONINIHS 243* 7,259* 19,551*    BNP Invalid input(s): POCBNP D-Dimer No results for input(s): DDIMER in the last 72 hours. Hemoglobin A1C Recent Labs    03/20/21 0219  HGBA1C 5.4   Fasting Lipid Panel Recent Labs    03/20/21 0219  CHOL 240*  HDL 44  LDLCALC 163*  TRIG 167*  CHOLHDL 5.5   Thyroid Function Tests No results for input(s): TSH, T4TOTAL, T3FREE, THYROIDAB in the last 72 hours.  Invalid input(s): FREET3 _____________  CARDIAC CATHETERIZATION  Result Date: 03/20/2021  Mid LAD-1 lesion is 100% stenosed. After thrombectomy, a drug-eluting stent was successfully placed using a SYNERGY XD 3.0X16, postdilated to 3.75 mm.  Post intervention, there is a 0% residual stenosis.  Mid LAD-2 lesion is 95% stenosed. A drug-eluting stent was successfully placed using a SYNERGY XD 2.25X28, postdilated to 2.5 mm.  Post intervention, there is a 0% residual stenosis.  Mid Cx lesion is 90% stenosed. A drug-eluting stent was successfully placed using a SYNERGY XD 3.50X16, postdilated to 3.75 mm.  Post  intervention, there is a 0% residual stenosis.  2nd Diag lesion is 50% stenosed.  Dist LAD lesion is 25% stenosed. The mid to distal LAD was a very small vessel in general.  There is mild to moderate left ventricular systolic dysfunction.  LV end diastolic pressure is moderately elevated. LVEDP 23 mm Hg.  The left ventricular ejection fraction is 35-45% by visual estimate.  There is no aortic valve stenosis.  Continue dual antiplatelet therapy for 12 months.  She will need aggressive secondary prevention.  She had myalgias with lovastatin.  Start rosuvastatin 20 mg daily.  She will need metoprolol and likely an ACE inhibitor depending on her ejection fraction.  We will plan for echocardiogram on Monday.  No IV fluids due to elevated LVEDP in the setting of anterior wall MI and decreased LVEF. I discussed the findings with her husband.   DG Chest Port 1 View  Result Date: 03/20/2021 CLINICAL DATA:  Chest pain EXAM: PORTABLE CHEST 1 VIEW COMPARISON:  07/12/2020 FINDINGS: Heart and mediastinal contours are within normal limits. No focal opacities or effusions. No acute bony abnormality. IMPRESSION: No active disease. Electronically Signed   By: Rolm Baptise M.D.   On: 03/20/2021 02:43   ECHOCARDIOGRAM COMPLETE  Result Date: 03/20/2021    ECHOCARDIOGRAM REPORT   Patient Name:   DAWNN NAM Date of Exam: 03/20/2021 Medical Rec #:  [id number]     Height:       64.0 in Accession #:    [id number]    Weight:       166.7 lb Date of Birth:  1951-01-26      BSA:          1.811 m Patient Age:    64 years      BP:           104/67 mmHg Patient Gender: F             HR:           97 bpm. Exam Location:  Inpatient Procedure: 2D Echo, Cardiac Doppler and Color Doppler Indications:    Acute myocardial infarction  History:        Patient has no prior history of Echocardiogram examinations.  Risk Factors:Hypertension and Dyslipidemia.  Sonographer:    Clayton Lefort RDCS (AE) Referring Phys: [id number] MARK C SKAINS  IMPRESSIONS  1. Left ventricular ejection fraction, by estimation, is 40 to 45%. The left ventricle has mildly decreased function. The left ventricle has no regional wall motion abnormalities. There is mild left ventricular hypertrophy. Left ventricular diastolic parameters are consistent with Grade I diastolic dysfunction (impaired relaxation).  2. Right ventricular systolic function is normal. The right ventricular size is normal. There is mildly elevated pulmonary artery systolic pressure.  3. The mitral valve is normal in structure. No evidence of mitral valve regurgitation. No evidence of mitral stenosis.  4. The aortic valve is normal in structure. Aortic valve regurgitation is not visualized. No aortic stenosis is present.  5. The inferior vena cava is dilated in size with <50% respiratory variability, suggesting right atrial pressure of 15 mmHg. FINDINGS  Left Ventricle: Left ventricular ejection fraction, by estimation, is 40 to 45%. The left ventricle has mildly decreased function. The left ventricle has no regional wall motion abnormalities. Definity contrast agent was given IV to delineate the left ventricular endocardial borders. The left ventricular internal cavity size was normal in size. There is mild left ventricular hypertrophy. Left ventricular diastolic parameters are consistent with Grade I diastolic dysfunction (impaired relaxation).  LV Wall Scoring: The apical inferior segment and apex are akinetic. The entire anterior septum and mid inferoseptal segment are hypokinetic. Right Ventricle: The right ventricular size is normal. No increase in right ventricular wall thickness. Right ventricular systolic function is normal. There is mildly elevated pulmonary artery systolic pressure. The tricuspid regurgitant velocity is 2.45  m/s, and with an assumed right atrial pressure of 15 mmHg, the estimated right ventricular systolic pressure is 93.2 mmHg. Left Atrium: Left atrial size was normal in size.  Right Atrium: Right atrial size was normal in size. Pericardium: There is no evidence of pericardial effusion. Mitral Valve: The mitral valve is normal in structure. No evidence of mitral valve regurgitation. No evidence of mitral valve stenosis. MV peak gradient, 6.8 mmHg. The mean mitral valve gradient is 2.0 mmHg. Tricuspid Valve: The tricuspid valve is normal in structure. Tricuspid valve regurgitation is trivial. No evidence of tricuspid stenosis. Aortic Valve: The aortic valve is normal in structure. Aortic valve regurgitation is not visualized. No aortic stenosis is present. Aortic valve mean gradient measures 4.0 mmHg. Aortic valve peak gradient measures 6.8 mmHg. Aortic valve area, by VTI measures 2.46 cm. Pulmonic Valve: The pulmonic valve was normal in structure. Pulmonic valve regurgitation is not visualized. No evidence of pulmonic stenosis. Aorta: The aortic root is normal in size and structure. Venous: The inferior vena cava is dilated in size with less than 50% respiratory variability, suggesting right atrial pressure of 15 mmHg. IAS/Shunts: No atrial level shunt detected by color flow Doppler.  LEFT VENTRICLE PLAX 2D LVIDd:         4.20 cm      Diastology LVIDs:         3.20 cm      LV e' medial:    5.87 cm/s LV PW:         1.20 cm      LV E/e' medial:  12.5 LV IVS:        1.10 cm      LV e' lateral:   6.85 cm/s LVOT diam:     1.85 cm      LV E/e' lateral: 10.7 LV SV:  61 LV SV Index:   34 LVOT Area:     2.69 cm  LV Volumes (MOD) LV vol d, MOD A2C: 105.0 ml LV vol d, MOD A4C: 78.6 ml LV vol s, MOD A2C: 60.7 ml LV vol s, MOD A4C: 38.9 ml LV SV MOD A2C:     44.3 ml LV SV MOD A4C:     78.6 ml LV SV MOD BP:      44.0 ml RIGHT VENTRICLE             IVC RV Basal diam:  2.60 cm     IVC diam: 2.20 cm RV S prime:     17.60 cm/s TAPSE (M-mode): 2.2 cm LEFT ATRIUM             Index       RIGHT ATRIUM           Index LA diam:        2.80 cm 1.55 cm/m  RA Area:     11.40 cm LA Vol (A2C):   30.3 ml  16.74 ml/m RA Volume:   26.20 ml  14.47 ml/m LA Vol (A4C):   24.8 ml 13.70 ml/m LA Biplane Vol: 28.1 ml 15.52 ml/m  AORTIC VALVE AV Area (Vmax):    2.54 cm AV Area (Vmean):   1.99 cm AV Area (VTI):     2.46 cm AV Vmax:           130.00 cm/s AV Vmean:          93.200 cm/s AV VTI:            0.249 m AV Peak Grad:      6.8 mmHg AV Mean Grad:      4.0 mmHg LVOT Vmax:         123.00 cm/s LVOT Vmean:        69.100 cm/s LVOT VTI:          0.228 m LVOT/AV VTI ratio: 0.92  AORTA Ao Root diam: 3.10 cm Ao Asc diam:  3.30 cm MITRAL VALVE                TRICUSPID VALVE MV Area (PHT): 3.37 cm     TR Peak grad:   24.0 mmHg MV Area VTI:   2.68 cm     TR Vmax:        245.00 cm/s MV Peak grad:  6.8 mmHg MV Mean grad:  2.0 mmHg     SHUNTS MV Vmax:       1.30 m/s     Systemic VTI:  0.23 m MV Vmean:      65.1 cm/s    Systemic Diam: 1.85 cm MV Decel Time: 225 msec MV E velocity: 73.10 cm/s MV A velocity: 118.00 cm/s MV E/A ratio:  0.62 Candee Furbish MD Electronically signed by Candee Furbish MD Signature Date/Time: 03/20/2021/4:27:59 PM    Final    Disposition   Pt is being discharged home today in good condition.  Follow-up Plans & Appointments     Follow-up Information    Jettie Booze, MD Follow up on 03/31/2021.   Specialties: Cardiology, Radiology, Interventional Cardiology Why: at 3:40pm for your follow up appt. Contact information: [address] [place] [address] [phone]              Discharge Instructions    Amb Referral to Cardiac Rehabilitation   Complete by: As directed    Diagnosis:  Coronary Stents STEMI  After initial evaluation and assessments completed: Virtual Based Care may be provided alone or in conjunction with Phase 2 Cardiac Rehab based on patient barriers.: Yes   Call MD for:  difficulty breathing, headache or visual disturbances   Complete by: As directed    Call MD for:  persistant dizziness or light-headedness   Complete by: As directed     Call MD for:  redness, tenderness, or signs of infection (pain, swelling, redness, odor or green/yellow discharge around incision site)   Complete by: As directed    Diet - low sodium heart healthy   Complete by: As directed    Discharge instructions   Complete by: As directed    Radial Site Care Refer to this sheet in the next few weeks. These instructions provide you with information on caring for yourself after your procedure. Your caregiver may also give you more specific instructions. Your treatment has been planned according to current medical practices, but problems sometimes occur. Call your caregiver if you have any problems or questions after your procedure. HOME CARE INSTRUCTIONS You may shower the day after the procedure.Remove the bandage (dressing) and gently wash the site with plain soap and water.Gently pat the site dry.  Do not apply powder or lotion to the site.  Do not submerge the affected site in water for 3 to 5 days.  Inspect the site at least twice daily.  Do not flex or bend the affected arm for 24 hours.  No lifting over 5 pounds (2.3 kg) for 5 days after your procedure.  Do not drive home if you are discharged the same day of the procedure. Have someone else drive you.  You may drive 24 hours after the procedure unless otherwise instructed by your caregiver.  What to expect: Any bruising will usually fade within 1 to 2 weeks.  Blood that collects in the tissue (hematoma) may be painful to the touch. It should usually decrease in size and tenderness within 1 to 2 weeks.  SEEK IMMEDIATE MEDICAL CARE IF: You have unusual pain at the radial site.  You have redness, warmth, swelling, or pain at the radial site.  You have drainage (other than a small amount of blood on the dressing).  You have chills.  You have a fever or persistent symptoms for more than 72 hours.  You have a fever and your symptoms suddenly get worse.  Your arm becomes pale, cool, tingly, or numb.   You have heavy bleeding from the site. Hold pressure on the site.   PLEASE DO NOT MISS ANY DOSES OF YOUR BRILINTA!!!!! Also keep a log of you blood pressures and bring back to your follow up appt. Please call the office with any questions.   Patients taking blood thinners should generally stay away from medicines like ibuprofen, Advil, Motrin, naproxen, and Aleve due to risk of stomach bleeding. You may take Tylenol as directed or talk to your primary doctor about alternatives.  PLEASE ENSURE THAT YOU DO NOT RUN OUT OF YOUR BRILINTA. This medication is very important to remain on for at least one year. IF you have issues obtaining this medication due to cost please CALL the office 3-5 business days prior to running out in order to prevent missing doses of this medication.   Increase activity slowly   Complete by: As directed       Discharge Medications   Allergies as of 03/22/2021      Reactions   Albuterol    Jittery  sensation   Lovastatin    Myalgias      Medication List    TAKE these medications   aspirin 81 MG EC tablet Take 1 tablet (81 mg total) by mouth daily.   b complex vitamins tablet Take 1 tablet by mouth 3 (three) times a week.   beta carotene 10000 UNIT capsule Take 10,000 Units by mouth daily.   CALTRATE 600+D PO Take by mouth daily.   DHEA 25 MG Caps Take 25 mg by mouth daily.   fish oil-omega-3 fatty acids 1000 MG capsule Take 1 g by mouth daily.   Magnesium 250 MG Tabs Take 250 mg by mouth daily.   Melatonin 10 MG Tabs Take 10 mg by mouth at bedtime.   metoprolol tartrate 25 MG tablet Commonly known as: LOPRESSOR Take 1 tablet (25 mg total) by mouth 2 (two) times daily.   nitroGLYCERIN 0.4 MG SL tablet Commonly known as: Nitrostat Place 1 tablet (0.4 mg total) under the tongue every 5 (five) minutes as needed for chest pain.   NON FORMULARY Progesterone pellet/ estrogen  per outside clinic.   progesterone 100 MG capsule Commonly known  as: PROMETRIUM Take 300 mg by mouth at bedtime.   rosuvastatin 40 MG tablet Commonly known as: CRESTOR Take 1 tablet (40 mg total) by mouth daily. Start taking on: March 23, 2021   Systane 0.4-0.3 % Soln Generic drug: Polyethyl Glycol-Propyl Glycol Apply 1 drop to eye 2 (two) times daily.   thyroid 60 MG tablet Commonly known as: ARMOUR Take 60-90 mg by mouth See admin instructions. 90mg  in AM and 60mg  in PM   ticagrelor 90 MG Tabs tablet Commonly known as: BRILINTA Take 1 tablet (90 mg total) by mouth 2 (two) times daily.   vitamin C 1000 MG tablet Take 1,000 mg by mouth 2 (two) times daily.   Vitamin D 50 MCG (2000 UT) Caps Take 1 capsule by mouth 2 (two) times daily.   zinc gluconate 50 MG tablet Take 50 mg by mouth daily.       Outstanding Labs/Studies   FLP/LFTs in 8 weeks  Duration of Discharge Encounter   Greater than 30 minutes including physician time.  Signed, Reino Bellis, NP 03/22/2021, 12:24 PM     I have examined the patient and reviewed assessment and plan and discussed with patient.  Agree with above as stated.    CAD/anterior MI: Status post PCI to LAD which was culprit vessel and to circumflex.  Continue beta-blocker.  Hyperlipidemia: Continue high-dose statin.  Whole food, plant-based diet.  Cardiac rehab will be helpful.  LV dysfunction: Mild.  Blood pressures have not been elevated.  She had some hypotension yesterday.  I am hesitant to start an ACE inhibitor at this time.  Continue beta-blocker.  Okay for discharge later today.  Larae Grooms

## 2021-03-22 NOTE — Progress Notes (Signed)
Progress Note  Patient Name: Kim Brown Date of Encounter: 03/22/2021  [facility] Cardiologist: No primary care provider on file. Janziel Hockett  Subjective   Did not transfer out of ICU yesterday due to lack of beds.  Inpatient Medications    Scheduled Meds: . aspirin  81 mg Oral Daily  . Chlorhexidine Gluconate Cloth  6 each Topical Q0600  . metoprolol tartrate  25 mg Oral BID  . omega-3 acid ethyl esters  1 g Oral Daily  . rosuvastatin  40 mg Oral Daily  . sodium chloride flush  3 mL Intravenous Q12H  . thyroid  60 mg Oral QHS  . thyroid  90 mg Oral Daily  . ticagrelor  90 mg Oral BID   Continuous Infusions: . sodium chloride 75 mL/hr at 03/20/21 0249  . sodium chloride     PRN Meds: sodium chloride, acetaminophen, nitroGLYCERIN, ondansetron (ZOFRAN) IV, sodium chloride flush   Vital Signs    Vitals:   03/21/21 1943 03/21/21 2000 03/22/21 0400 03/22/21 0628  BP: 128/85   113/73  Pulse: 80   84  Resp: 14 20 18 17   Temp: 98.8 F (37.1 C)   98.4 F (36.9 C)  TempSrc: Oral   Oral  SpO2: 99%   95%  Weight:    71.7 kg  Height:        Intake/Output Summary (Last 24 hours) at 03/22/2021 0837 Last data filed at 03/22/2021 0300 Gross per 24 hour  Intake 803 ml  Output --  Net 803 ml   Last 3 Weights 03/22/2021 03/21/2021 03/20/2021  Weight (lbs) 158 lb 159 lb 14.4 oz 166 lb 10.7 oz  Weight (kg) 71.668 kg 72.53 kg 75.6 kg      Telemetry    NSR - Personally Reviewed  ECG      Physical Exam   GEN: No acute distress.   Neck: No JVD Cardiac: RRR, no murmurs, rubs, or gallops.  Respiratory: Clear to auscultation bilaterally. GI: Soft, nontender, non-distended  MS: No edema; No deformity.  Mild bruising at right radial site, no hematoma Neuro:  Nonfocal  Psych: Normal affect   Labs    High Sensitivity Troponin:   Recent Labs  Lab 03/20/21 0219 03/20/21 0549 03/21/21 0536  TROPONINIHS 243* 7,259* 19,551*      Chemistry Recent Labs  Lab  03/20/21 0219 03/20/21 0307 03/21/21 0322  NA 135 136 140  K 4.7 3.3* 3.8  CL 101 101 107  CO2 20*  --  26  GLUCOSE 181* 209* 107*  BUN 16 17 10   CREATININE 1.05* 0.70 0.91  CALCIUM 9.6  --  8.6*  PROT 6.7  --   --   ALBUMIN 3.9  --   --   AST 50*  --   --   ALT 16  --   --   ALKPHOS 60  --   --   BILITOT 0.9  --   --   GFRNONAA 58*  --  >60  ANIONGAP 14  --  7     Hematology Recent Labs  Lab 03/20/21 0219 03/20/21 0307 03/21/21 0322  WBC 16.4*  --  11.7*  RBC 5.08  --  4.24  HGB 15.7* 13.6 13.2  HCT 47.2* 40.0 39.7  MCV 92.9  --  93.6  MCH 30.9  --  31.1  MCHC 33.3  --  33.2  RDW 13.3  --  13.7  PLT 251  --  260    BNPNo results for  input(s): BNP, PROBNP in the last 168 hours.   DDimer No results for input(s): DDIMER in the last 168 hours.   Radiology    ECHOCARDIOGRAM COMPLETE  Result Date: 03/20/2021    ECHOCARDIOGRAM REPORT   Patient Name:   Kim Brown Date of Exam: 03/20/2021 Medical Rec #:  [id number]     Height:       64.0 in Accession #:    [id number]    Weight:       166.7 lb Date of Birth:  May 22, 1951      BSA:          1.811 m Patient Age:    16 years      BP:           104/67 mmHg Patient Gender: F             HR:           97 bpm. Exam Location:  Inpatient Procedure: 2D Echo, Cardiac Doppler and Color Doppler Indications:    Acute myocardial infarction  History:        Patient has no prior history of Echocardiogram examinations.                 Risk Factors:Hypertension and Dyslipidemia.  Sonographer:    Clayton Lefort RDCS (AE) Referring Phys: [id number] MARK C SKAINS IMPRESSIONS  1. Left ventricular ejection fraction, by estimation, is 40 to 45%. The left ventricle has mildly decreased function. The left ventricle has no regional wall motion abnormalities. There is mild left ventricular hypertrophy. Left ventricular diastolic parameters are consistent with Grade I diastolic dysfunction (impaired relaxation).  2. Right ventricular systolic function is normal. The  right ventricular size is normal. There is mildly elevated pulmonary artery systolic pressure.  3. The mitral valve is normal in structure. No evidence of mitral valve regurgitation. No evidence of mitral stenosis.  4. The aortic valve is normal in structure. Aortic valve regurgitation is not visualized. No aortic stenosis is present.  5. The inferior vena cava is dilated in size with <50% respiratory variability, suggesting right atrial pressure of 15 mmHg. FINDINGS  Left Ventricle: Left ventricular ejection fraction, by estimation, is 40 to 45%. The left ventricle has mildly decreased function. The left ventricle has no regional wall motion abnormalities. Definity contrast agent was given IV to delineate the left ventricular endocardial borders. The left ventricular internal cavity size was normal in size. There is mild left ventricular hypertrophy. Left ventricular diastolic parameters are consistent with Grade I diastolic dysfunction (impaired relaxation).  LV Wall Scoring: The apical inferior segment and apex are akinetic. The entire anterior septum and mid inferoseptal segment are hypokinetic. Right Ventricle: The right ventricular size is normal. No increase in right ventricular wall thickness. Right ventricular systolic function is normal. There is mildly elevated pulmonary artery systolic pressure. The tricuspid regurgitant velocity is 2.45  m/s, and with an assumed right atrial pressure of 15 mmHg, the estimated right ventricular systolic pressure is 56.3 mmHg. Left Atrium: Left atrial size was normal in size. Right Atrium: Right atrial size was normal in size. Pericardium: There is no evidence of pericardial effusion. Mitral Valve: The mitral valve is normal in structure. No evidence of mitral valve regurgitation. No evidence of mitral valve stenosis. MV peak gradient, 6.8 mmHg. The mean mitral valve gradient is 2.0 mmHg. Tricuspid Valve: The tricuspid valve is normal in structure. Tricuspid valve  regurgitation is trivial. No evidence of tricuspid stenosis. Aortic Valve: The aortic valve is  normal in structure. Aortic valve regurgitation is not visualized. No aortic stenosis is present. Aortic valve mean gradient measures 4.0 mmHg. Aortic valve peak gradient measures 6.8 mmHg. Aortic valve area, by VTI measures 2.46 cm. Pulmonic Valve: The pulmonic valve was normal in structure. Pulmonic valve regurgitation is not visualized. No evidence of pulmonic stenosis. Aorta: The aortic root is normal in size and structure. Venous: The inferior vena cava is dilated in size with less than 50% respiratory variability, suggesting right atrial pressure of 15 mmHg. IAS/Shunts: No atrial level shunt detected by color flow Doppler.  LEFT VENTRICLE PLAX 2D LVIDd:         4.20 cm      Diastology LVIDs:         3.20 cm      LV e' medial:    5.87 cm/s LV PW:         1.20 cm      LV E/e' medial:  12.5 LV IVS:        1.10 cm      LV e' lateral:   6.85 cm/s LVOT diam:     1.85 cm      LV E/e' lateral: 10.7 LV SV:         61 LV SV Index:   34 LVOT Area:     2.69 cm  LV Volumes (MOD) LV vol d, MOD A2C: 105.0 ml LV vol d, MOD A4C: 78.6 ml LV vol s, MOD A2C: 60.7 ml LV vol s, MOD A4C: 38.9 ml LV SV MOD A2C:     44.3 ml LV SV MOD A4C:     78.6 ml LV SV MOD BP:      44.0 ml RIGHT VENTRICLE             IVC RV Basal diam:  2.60 cm     IVC diam: 2.20 cm RV S prime:     17.60 cm/s TAPSE (M-mode): 2.2 cm LEFT ATRIUM             Index       RIGHT ATRIUM           Index LA diam:        2.80 cm 1.55 cm/m  RA Area:     11.40 cm LA Vol (A2C):   30.3 ml 16.74 ml/m RA Volume:   26.20 ml  14.47 ml/m LA Vol (A4C):   24.8 ml 13.70 ml/m LA Biplane Vol: 28.1 ml 15.52 ml/m  AORTIC VALVE AV Area (Vmax):    2.54 cm AV Area (Vmean):   1.99 cm AV Area (VTI):     2.46 cm AV Vmax:           130.00 cm/s AV Vmean:          93.200 cm/s AV VTI:            0.249 m AV Peak Grad:      6.8 mmHg AV Mean Grad:      4.0 mmHg LVOT Vmax:         123.00 cm/s LVOT  Vmean:        69.100 cm/s LVOT VTI:          0.228 m LVOT/AV VTI ratio: 0.92  AORTA Ao Root diam: 3.10 cm Ao Asc diam:  3.30 cm MITRAL VALVE                TRICUSPID VALVE MV Area (PHT): 3.37 cm     TR Peak grad:  24.0 mmHg MV Area VTI:   2.68 cm     TR Vmax:        245.00 cm/s MV Peak grad:  6.8 mmHg MV Mean grad:  2.0 mmHg     SHUNTS MV Vmax:       1.30 m/s     Systemic VTI:  0.23 m MV Vmean:      65.1 cm/s    Systemic Diam: 1.85 cm MV Decel Time: 225 msec MV E velocity: 73.10 cm/s MV A velocity: 118.00 cm/s MV E/A ratio:  0.62 Candee Furbish MD Electronically signed by Candee Furbish MD Signature Date/Time: 03/20/2021/4:27:59 PM    Final     Cardiac Studies   Cath findings reviewed  Patient Profile     70 y.o. female with recent anterior MI  Assessment & Plan    CAD/anterior MI: Status post PCI to LAD which was culprit vessel and to circumflex.  Continue beta-blocker.  Hyperlipidemia: Continue high-dose statin.  Whole food, plant-based diet.  Cardiac rehab will be helpful.  LV dysfunction: Mild.  Blood pressures have not been elevated.  She had some hypotension yesterday.  I am hesitant to start an ACE inhibitor at this time.  Continue beta-blocker.  Okay for discharge later today.  For questions or updates, please contact [place] Please consult [email or web address] for contact info under        Signed, Larae Grooms, MD  03/22/2021, 8:37 AM

## 2021-03-22 NOTE — Discharge Instructions (Signed)

## 2021-03-22 NOTE — Research (Signed)
ID- [id number],  SOS-AMI  Authorized Site Personnel  [x]   Shirley Muscat, RN []   Philemon Kingdom, RN  []   Amy Enid Derry, RCIS  []   Berneda Rose, RN   SUBJECT NAME: _[name]___________ MRN: _[id number]____________   Date of study introduction: _05-APR-2022___   ([id number])    Time of introduction: ___0815____  (24 hour clock)  During the subject's hospital visit , the authorized site personnel discussed with  with the subject the possibility to participate in the SOS-AMI study, and provided  the subject with the approved Subject Information Leaflet (SIL)-Informed Consent form (ICF) in a language that he/she can understand.  The subject took the document home to reflect on her participation in this study before signing it. She will contact our Research office next week to arrange a date for Consent / Training visit.   [x]   The Subject will return to the Cardiovascular Research office, at a later day, to            complete the consenting process.  OR  []   The Subject will be given ample time to reflect on this study but will be completing            consent process prior to his/ her discharge from this admission.   **Form based on [employer] [id number] [id number] [id number] [id number]

## 2021-03-22 NOTE — Progress Notes (Signed)
CARDIAC REHAB PHASE I     Up at door   MODE:  Ambulation: 740 ft   POST:  Rate/Rhythm: 112 ST  BP:  Supine:   Sitting: 135/78  Standing:    SaO2: 100%RA 0940-0957 Pt walked 740 ft on RA with steady gait and no CP. Tolerated well. No questions re ed done yesterday.   Graylon Good, RN BSN  03/22/2021 9:54 AM

## 2021-03-25 ENCOUNTER — Telehealth: Payer: Self-pay | Admitting: Interventional Cardiology

## 2021-03-25 NOTE — Telephone Encounter (Signed)
Husband states that is had 3 stents placed on Sunday. States she has been going down hill since she got home. She is having clammy sweats, more lethargic with weakness, lots of belching, and intermittent dizziness. Eating and drinking okay. VS from this morning BP 111/76 HR 79 BP 104/74 HR 77, afebrile. No chest pain, N/V or dyspnea. Brilinta and aspirin has been taken this morning. Waiting to talk with [place] about Rosuvastatin and Metoprolol Tartrate. Cath site looks stable according to husband, a little bruising but no signs of infection.   Spoke to [employer]. Advised to hold Metoprolol Tartrate today, okay to take Rosuvastatin. He doesn't feel strongly that this is an acute cardiac issue. Rest over the weekend and IF she gets worse go to the ER, IF she is not improving by Monday to call the office and she may need some labs drawn such as CBC, BMP.  Spoke to husband and he verbalized agreement and understanding.

## 2021-03-25 NOTE — Telephone Encounter (Signed)
Patient's husband called and said that patient had a stint put in and has been going down hill since. Maybe reaction to stint or medication. Sent to triage

## 2021-03-27 ENCOUNTER — Emergency Department (HOSPITAL_COMMUNITY): Payer: PPO

## 2021-03-27 ENCOUNTER — Observation Stay (HOSPITAL_COMMUNITY)
Admission: EM | Admit: 2021-03-27 | Discharge: 2021-03-29 | Disposition: A | Discharge status: Home / Self Care | Payer: PPO | Attending: Internal Medicine | Admitting: Internal Medicine

## 2021-03-27 ENCOUNTER — Other Ambulatory Visit: Payer: Self-pay

## 2021-03-27 ENCOUNTER — Encounter (HOSPITAL_COMMUNITY): Payer: Self-pay | Admitting: Emergency Medicine

## 2021-03-27 DIAGNOSIS — I255 Ischemic cardiomyopathy: Secondary | ICD-10-CM | POA: Diagnosis not present

## 2021-03-27 DIAGNOSIS — I5022 Chronic systolic (congestive) heart failure: Secondary | ICD-10-CM | POA: Insufficient documentation

## 2021-03-27 DIAGNOSIS — E785 Hyperlipidemia, unspecified: Secondary | ICD-10-CM | POA: Diagnosis present

## 2021-03-27 DIAGNOSIS — R42 Dizziness and giddiness: Secondary | ICD-10-CM | POA: Diagnosis not present

## 2021-03-27 DIAGNOSIS — R399 Unspecified symptoms and signs involving the genitourinary system: Secondary | ICD-10-CM | POA: Diagnosis present

## 2021-03-27 DIAGNOSIS — I2510 Atherosclerotic heart disease of native coronary artery without angina pectoris: Secondary | ICD-10-CM | POA: Diagnosis not present

## 2021-03-27 DIAGNOSIS — R55 Syncope and collapse: Secondary | ICD-10-CM | POA: Diagnosis not present

## 2021-03-27 DIAGNOSIS — E039 Hypothyroidism, unspecified: Secondary | ICD-10-CM | POA: Diagnosis not present

## 2021-03-27 DIAGNOSIS — Z79899 Other long term (current) drug therapy: Secondary | ICD-10-CM | POA: Insufficient documentation

## 2021-03-27 DIAGNOSIS — I252 Old myocardial infarction: Secondary | ICD-10-CM | POA: Insufficient documentation

## 2021-03-27 DIAGNOSIS — R339 Retention of urine, unspecified: Secondary | ICD-10-CM

## 2021-03-27 DIAGNOSIS — Z7982 Long term (current) use of aspirin: Secondary | ICD-10-CM | POA: Insufficient documentation

## 2021-03-27 DIAGNOSIS — R0902 Hypoxemia: Secondary | ICD-10-CM | POA: Diagnosis not present

## 2021-03-27 DIAGNOSIS — I7090 Unspecified atherosclerosis: Secondary | ICD-10-CM | POA: Diagnosis not present

## 2021-03-27 DIAGNOSIS — R531 Weakness: Secondary | ICD-10-CM | POA: Diagnosis not present

## 2021-03-27 DIAGNOSIS — F419 Anxiety disorder, unspecified: Secondary | ICD-10-CM | POA: Diagnosis not present

## 2021-03-27 DIAGNOSIS — Z20822 Contact with and (suspected) exposure to covid-19: Secondary | ICD-10-CM | POA: Diagnosis not present

## 2021-03-27 DIAGNOSIS — R002 Palpitations: Secondary | ICD-10-CM | POA: Diagnosis not present

## 2021-03-27 DIAGNOSIS — I110 Hypertensive heart disease with heart failure: Secondary | ICD-10-CM | POA: Diagnosis not present

## 2021-03-27 LAB — URINALYSIS, ROUTINE W REFLEX MICROSCOPIC
Bilirubin Urine: NEGATIVE
Glucose, UA: NEGATIVE mg/dL
Hgb urine dipstick: NEGATIVE
Ketones, ur: 5 mg/dL — AB
Leukocytes,Ua: NEGATIVE
Nitrite: NEGATIVE
Protein, ur: NEGATIVE mg/dL
Specific Gravity, Urine: 1.003 — ABNORMAL LOW (ref 1.005–1.030)
pH: 8 (ref 5.0–8.0)

## 2021-03-27 LAB — COMPREHENSIVE METABOLIC PANEL
ALT: 18 U/L (ref 0–44)
AST: 21 U/L (ref 15–41)
Albumin: 3.7 g/dL (ref 3.5–5.0)
Alkaline Phosphatase: 67 U/L (ref 38–126)
Anion gap: 9 (ref 5–15)
BUN: 12 mg/dL (ref 8–23)
CO2: 20 mmol/L — ABNORMAL LOW (ref 22–32)
Calcium: 9.1 mg/dL (ref 8.9–10.3)
Chloride: 111 mmol/L (ref 98–111)
Creatinine, Ser: 0.92 mg/dL (ref 0.44–1.00)
GFR, Estimated: >60 mL/min (ref >60)
Glucose, Bld: 109 mg/dL — ABNORMAL HIGH (ref 70–99)
Potassium: 4 mmol/L (ref 3.5–5.1)
Sodium: 140 mmol/L (ref 135–145)
Total Bilirubin: 0.7 mg/dL (ref 0.3–1.2)
Total Protein: 6.6 g/dL (ref 6.5–8.1)

## 2021-03-27 LAB — CBC WITH DIFFERENTIAL/PLATELET
Abs Immature Granulocytes: 0.06 10*3/uL (ref 0.00–0.07)
Basophils Absolute: 0.1 10*3/uL (ref 0.0–0.1)
Basophils Relative: 1 %
Eosinophils Absolute: 0.2 10*3/uL (ref 0.0–0.5)
Eosinophils Relative: 2 %
HCT: 46.1 % — ABNORMAL HIGH (ref 36.0–46.0)
Hemoglobin: 15.3 g/dL — ABNORMAL HIGH (ref 12.0–15.0)
Immature Granulocytes: 1 %
Lymphocytes Relative: 14 %
Lymphs Abs: 1.5 10*3/uL (ref 0.7–4.0)
MCH: 30.4 pg (ref 26.0–34.0)
MCHC: 33.2 g/dL (ref 30.0–36.0)
MCV: 91.7 fL (ref 80.0–100.0)
Monocytes Absolute: 0.6 10*3/uL (ref 0.1–1.0)
Monocytes Relative: 5 %
Neutro Abs: 8.6 10*3/uL — ABNORMAL HIGH (ref 1.7–7.7)
Neutrophils Relative %: 77 %
Platelets: 301 10*3/uL (ref 150–400)
RBC: 5.03 MIL/uL (ref 3.87–5.11)
RDW: 13.1 % (ref 11.5–15.5)
WBC: 10.9 10*3/uL — ABNORMAL HIGH (ref 4.0–10.5)
nRBC: 0 % (ref 0.0–0.2)

## 2021-03-27 LAB — BRAIN NATRIURETIC PEPTIDE: B Natriuretic Peptide: 161.8 pg/mL — ABNORMAL HIGH (ref 0.0–100.0)

## 2021-03-27 LAB — RESP PANEL BY RT-PCR (FLU A&B, COVID) ARPGX2
Influenza A by PCR: NEGATIVE
Influenza B by PCR: NEGATIVE
SARS Coronavirus 2 by RT PCR: NEGATIVE

## 2021-03-27 LAB — TROPONIN I (HIGH SENSITIVITY)
Troponin I (High Sensitivity): 69 ng/L — ABNORMAL HIGH (ref <18)
Troponin I (High Sensitivity): 75 ng/L — ABNORMAL HIGH (ref <18)

## 2021-03-27 MED ORDER — THYROID 60 MG PO TABS: 60.0000 mg | ORAL_TABLET | ORAL | Status: DC

## 2021-03-27 MED ORDER — TICAGRELOR 90 MG PO TABS
90.0000 mg | ORAL_TABLET | Freq: Two times a day (BID) | ORAL | Status: DC
  Administered 2021-03-28 – 2021-03-29 (×4): 90 mg via ORAL
  Filled 2021-03-27 (×4): qty 1

## 2021-03-27 MED ORDER — THYROID 60 MG PO TABS
90.0000 mg | ORAL_TABLET | Freq: Every day | ORAL | Status: DC
  Administered 2021-03-28: 90 mg via ORAL
  Filled 2021-03-27: qty 1

## 2021-03-27 MED ORDER — ACETAMINOPHEN 650 MG RE SUPP: 650.0000 mg | Freq: Four times a day (QID) | RECTAL | Status: DC | PRN

## 2021-03-27 MED ORDER — SODIUM CHLORIDE 0.45 % IV SOLN: INTRAVENOUS | Status: DC

## 2021-03-27 MED ORDER — ACETAMINOPHEN 325 MG PO TABS: 650.0000 mg | ORAL_TABLET | Freq: Four times a day (QID) | ORAL | Status: DC | PRN

## 2021-03-27 MED ORDER — THYROID 60 MG PO TABS
60.0000 mg | ORAL_TABLET | Freq: Every day | ORAL | Status: DC
  Filled 2021-03-27: qty 1

## 2021-03-27 MED ORDER — PROGESTERONE MICRONIZED 100 MG PO CAPS
300.0000 mg | ORAL_CAPSULE | Freq: Every day | ORAL | Status: DC
  Administered 2021-03-28 (×2): 300 mg via ORAL
  Filled 2021-03-27 (×2): qty 3

## 2021-03-27 MED ORDER — MELATONIN 5 MG PO TABS
10.0000 mg | ORAL_TABLET | Freq: Every day | ORAL | Status: DC
  Administered 2021-03-28 (×2): 10 mg via ORAL
  Filled 2021-03-27 (×2): qty 2

## 2021-03-27 MED ORDER — ASPIRIN EC 81 MG PO TBEC
81.0000 mg | DELAYED_RELEASE_TABLET | Freq: Every day | ORAL | Status: DC
  Administered 2021-03-28 – 2021-03-29 (×2): 81 mg via ORAL
  Filled 2021-03-27 (×2): qty 1

## 2021-03-27 MED ORDER — ROSUVASTATIN CALCIUM 20 MG PO TABS
40.0000 mg | ORAL_TABLET | Freq: Every day | ORAL | Status: DC
  Administered 2021-03-28 – 2021-03-29 (×2): 40 mg via ORAL
  Filled 2021-03-27 (×2): qty 2

## 2021-03-27 MED ORDER — HEPARIN SODIUM (PORCINE) 5000 UNIT/ML IJ SOLN
5000.0000 [IU] | Freq: Three times a day (TID) | INTRAMUSCULAR | Status: DC
  Administered 2021-03-28: 5000 [IU] via SUBCUTANEOUS
  Filled 2021-03-27: qty 1

## 2021-03-27 MED ORDER — METOPROLOL TARTRATE 12.5 MG HALF TABLET
12.5000 mg | ORAL_TABLET | Freq: Two times a day (BID) | ORAL | Status: DC
  Administered 2021-03-28 – 2021-03-29 (×2): 12.5 mg via ORAL
  Filled 2021-03-27 (×4): qty 1

## 2021-03-27 MED ORDER — NITROGLYCERIN 0.4 MG SL SUBL: 0.4000 mg | SUBLINGUAL_TABLET | SUBLINGUAL | Status: DC | PRN

## 2021-03-27 MED ORDER — SODIUM CHLORIDE 0.9% FLUSH: 3.0000 mL | Freq: Two times a day (BID) | INTRAVENOUS | Status: DC

## 2021-03-27 MED ORDER — SODIUM CHLORIDE 0.9 % IV BOLUS
1000.0000 mL | Freq: Once | INTRAVENOUS | Status: AC
  Administered 2021-03-27: 1000 mL via INTRAVENOUS

## 2021-03-27 NOTE — ED Notes (Signed)
Bladder scanned pt after complaining of not being able to urinate since 3pm this afternoon. Patient was holding more than 969mL. MD Steinl aware.

## 2021-03-27 NOTE — H&P (Signed)
History and Physical    Kim Brown HYW:[id number] DOB: 01-03-1951 DOA: 03/27/2021  PCP: Tonia Ghent, MD (Confirm with patient/family/NH records and if not entered, this has to be entered at [facility] point of entry) Patient coming from: home  I have personally briefly reviewed patient's old medical records in [place]  Chief Complaint: neary syncope  HPI: Kim Brown is a 70 y.o. female with medical history significant of A flutter, HTN, HLD, hypothyroidism, Bladder dysfxn with retention, CAD with STEMI 03/20/21 s/p PCI/DES stents x 3: LAD x 2, Cx x 1, LV systolic dysfxn with EF 48-54%. Since discharge she has been feeling weak. By report she has had clammy sweats, increased eructation and several episodes of near syncope. Family contacted [place] 03/25/21 with report of complaints and was told to hold BB. She was advised to be seen in ED for persistent symptoms. She presents to [facility] for continued symptoms and near syncopal episodes. She reports her symptoms are different from when had stemi when she says had jaw/dental pain and shoulder/arm pain. Denies any pleuritic pain. No cough or uri symptoms. No fever or chills. No abd pain or vomiting/diarrhea. No dysuria or gu c/o. States taking meds as prescribed. No blood loss, rectal bleeding or melena. No headache. No change in speech or vision. No numbness or focal/unilateral weakness.  ED Course: T 98  128/77  HR 90  RR 15. EDP exam notable for suprapubic fullness. Cmet nl, CBCD nl, BNP 162, Troponin 69, U/A negative, Covid Negative, CXR NAD. Bladder scan revealed full distended bladder. Bladder catherization released  approx 1600 cc urine. EKG w/o acute changes. TRH called to admit to tele observation and further evaluation.   Review of Systems: As per HPI otherwise 10 point review of systems negative.    Past Medical History:  Diagnosis Date  . Allergy   . Anemia   . Arthritis    feet and hands , neck, shoulder  . Dysrhythmia  2006   atrial flutter  . Exogenous obesity   . Heel spur   . Hyperlipidemia   . Hypertension   . Hypothyroid   . Irregular heart beats   . Ischemic cardiomyopathy   . Palpitations   . Prolapsed, anus    uses mag oxide powder nightly   . RSD (reflex sympathetic dystrophy)    after surgery on R foot  . STEMI (ST elevation myocardial infarction) ([place])   . Stress incontinence, female     Past Surgical History:  Procedure Laterality Date  . ABDOMINAL HYSTERECTOMY  2005  . BUNIONECTOMY    . COLONOSCOPY    . CORONARY STENT INTERVENTION N/A 03/20/2021   Procedure: CORONARY STENT INTERVENTION;  Surgeon: Jettie Booze, MD;  Location: [place] [facility];  Service: Cardiovascular;  Laterality: N/A;  . CORONARY/GRAFT ACUTE MI REVASCULARIZATION N/A 03/20/2021   Procedure: Coronary/Graft Acute MI Revascularization;  Surgeon: Jettie Booze, MD;  Location: [place] [facility];  Service: Cardiovascular;  Laterality: N/A;  . DILATION AND CURETTAGE OF UTERUS    . LEFT HEART CATH AND CORONARY ANGIOGRAPHY N/A 03/20/2021   Procedure: LEFT HEART CATH AND CORONARY ANGIOGRAPHY;  Surgeon: Jettie Booze, MD;  Location: [place] [facility];  Service: Cardiovascular;  Laterality: N/A;  . REVERSE SHOULDER ARTHROPLASTY Right 07/15/2020   Procedure: REVERSE SHOULDER ARTHROPLASTY;  Surgeon: Tania Ade, MD;  Location: [facility];  Service: Orthopedics;  Laterality: Right;    Soc Hx - married almost 50 years, three children, 9  grandchildren. LIves with husband and has been I-ADLs. She was a natural Biochemist, clinical.   reports that she has never smoked. She has never used smokeless tobacco. She reports that she does not drink alcohol and does not use drugs.  Allergies  Allergen Reactions  . Albuterol     Jittery sensation  . Lovastatin     Myalgias    Family History  Problem Relation Age of Onset  . Arthritis Mother   . Hypertension Mother   . Colon polyps Mother   . Heart disease  Father   . Hypertension Father   . Arthritis Father   . Stroke Father   . Heart failure Brother   . Diabetes Brother   . Pancreatic cancer Brother   . Colon cancer Neg Hx   . Breast cancer Neg Hx   . Esophageal cancer Neg Hx   . Rectal cancer Neg Hx   . Stomach cancer Neg Hx      Prior to Admission medications   Medication Sig Start Date End Date Taking? Authorizing Provider  Ascorbic Acid (VITAMIN C) 1000 MG tablet Take 1,000 mg by mouth 2 (two) times daily.    [provider]  aspirin 81 MG EC tablet Take 1 tablet (81 mg total) by mouth daily. 03/22/21 03/22/22  Reino Bellis B, NP  b complex vitamins tablet Take 1 tablet by mouth 3 (three) times a week.    [provider]  beta carotene 10000 UNIT capsule Take 10,000 Units by mouth daily.    [provider]  Calcium Carbonate-Vitamin D (CALTRATE 600+D PO) Take by mouth daily.    [provider]  Cholecalciferol (VITAMIN D) 2000 UNITS CAPS Take 1 capsule by mouth 2 (two) times daily.    [provider]  DHEA 25 MG CAPS Take 25 mg by mouth daily.    [provider]  fish oil-omega-3 fatty acids 1000 MG capsule Take 1 g by mouth daily.    [provider]  Magnesium 250 MG TABS Take 250 mg by mouth daily.    [provider]  Melatonin 10 MG TABS Take 10 mg by mouth at bedtime.     [provider]  metoprolol tartrate (LOPRESSOR) 25 MG tablet Take 1 tablet (25 mg total) by mouth 2 (two) times daily. 03/22/21   Cheryln Manly, NP  nitroGLYCERIN (NITROSTAT) 0.4 MG SL tablet Place 1 tablet (0.4 mg total) under the tongue every 5 (five) minutes as needed for chest pain. 03/22/21 03/22/22  Cheryln Manly, NP  NON FORMULARY Progesterone pellet/ estrogen  per outside clinic.    [provider]  Polyethyl Glycol-Propyl Glycol (SYSTANE) 0.4-0.3 % SOLN Apply 1 drop to eye 2 (two) times daily.    [provider]  progesterone (PROMETRIUM) 100 MG  capsule Take 300 mg by mouth at bedtime.  03/26/20   [provider]  rosuvastatin (CRESTOR) 40 MG tablet Take 1 tablet (40 mg total) by mouth daily. 03/23/21   Cheryln Manly, NP  thyroid (ARMOUR) 60 MG tablet Take 60-90 mg by mouth See admin instructions. 90mg  in AM and 60mg  in PM    [provider]  ticagrelor (BRILINTA) 90 MG TABS tablet Take 1 tablet (90 mg total) by mouth 2 (two) times daily. 03/22/21   Cheryln Manly, NP  zinc gluconate 50 MG tablet Take 50 mg by mouth daily.    [provider]    Physical Exam: Vitals:   03/27/21 2100  03/27/21 2130 03/27/21 2200 03/27/21 2230  BP: 134/76 129/77 132/79 125/77  Pulse: 90 90 81 86  Resp: 13 15 15 12   Temp:      TempSrc:      SpO2: 100% 100% 100% 100%     Vitals:   03/27/21 2100 03/27/21 2130 03/27/21 2200 03/27/21 2230  BP: 134/76 129/77 132/79 125/77  Pulse: 90 90 81 86  Resp: 13 15 15 12   Temp:      TempSrc:      SpO2: 100% 100% 100% 100%   General: WNWD woman who appears anxious and clammy. Eyes: PERRL, lids and conjunctivae normal ENMT: Mucous membranes are moist. Posterior pharynx clear of any exudate or lesions.Normal dentition.  Neck: normal, supple, no masses, no thyromegaly Respiratory: clear to auscultation bilaterally, no wheezing, no crackles. Normal respiratory effort. No accessory muscle use.  Cardiovascular: Regular tachycardic rate and rhythm with rare PVCs, no murmurs / rubs / gallops. No extremity edema. 2+ pedal pulses. No carotid bruits.  Abdomen: no tenderness, no masses palpated. No hepatosplenomegaly. Bowel sounds positive.  Musculoskeletal: no clubbing / cyanosis. No joint deformity upper and lower extremities. Good ROM, no contractures. Normal muscle tone.  Skin: no rashes, lesions, ulcers. No induration Neurologic: CN 2-12 grossly intact. Strength 5/5 in all 4.  Psychiatric: Normal judgment and insight but having difficulty focusing. Alert and oriented x 3. Normal mood.      Labs on Admission: I have personally reviewed following labs and imaging studies  CBC: Recent Labs  Lab 03/21/21 0322 03/27/21 2021  WBC 11.7* 10.9*  NEUTROABS  --  8.6*  HGB 13.2 15.3*  HCT 39.7 46.1*  MCV 93.6 91.7  PLT 260 284   Basic Metabolic Panel: Recent Labs  Lab 03/21/21 0322 03/27/21 2021  NA 140 140  K 3.8 4.0  CL 107 111  CO2 26 20*  GLUCOSE 107* 109*  BUN 10 12  CREATININE 0.91 0.92  CALCIUM 8.6* 9.1   GFR: Estimated Creatinine Clearance: 56 mL/min (by C-G formula based on SCr of 0.92 mg/dL). Liver Function Tests: Recent Labs  Lab 03/27/21 2021  AST 21  ALT 18  ALKPHOS 67  BILITOT 0.7  PROT 6.6  ALBUMIN 3.7   No results for input(s): LIPASE, AMYLASE in the last 168 hours. No results for input(s): AMMONIA in the last 168 hours. Coagulation Profile: No results for input(s): INR, PROTIME in the last 168 hours. Cardiac Enzymes: No results for input(s): CKTOTAL, CKMB, CKMBINDEX, TROPONINI in the last 168 hours. BNP (last 3 results) No results for input(s): PROBNP in the last 8760 hours. HbA1C: No results for input(s): HGBA1C in the last 72 hours. CBG: No results for input(s): GLUCAP in the last 168 hours. Lipid Profile: No results for input(s): CHOL, HDL, LDLCALC, TRIG, CHOLHDL, LDLDIRECT in the last 72 hours. Thyroid Function Tests: No results for input(s): TSH, T4TOTAL, FREET4, T3FREE, THYROIDAB in the last 72 hours. Anemia Panel: No results for input(s): VITAMINB12, FOLATE, FERRITIN, TIBC, IRON, RETICCTPCT in the last 72 hours. Urine analysis:    Component Value Date/Time   COLORURINE STRAW (A) 03/27/2021 2143   APPEARANCEUR CLEAR 03/27/2021 2143   LABSPEC 1.003 (L) 03/27/2021 2143   PHURINE 8.0 03/27/2021 2143   GLUCOSEU NEGATIVE 03/27/2021 2143   HGBUR NEGATIVE 03/27/2021 2143   BILIRUBINUR NEGATIVE 03/27/2021 2143   BILIRUBINUR Neg 09/15/2019 1256   KETONESUR 5 (A) 03/27/2021 2143   PROTEINUR NEGATIVE 03/27/2021 2143    UROBILINOGEN 0.2 09/15/2019 1256   UROBILINOGEN  0.2 06/11/2013 1640   NITRITE NEGATIVE 03/27/2021 2143   LEUKOCYTESUR NEGATIVE 03/27/2021 2143    Radiological Exams on Admission: DG Chest Port 1 View  Result Date: 03/27/2021 CLINICAL DATA:  Weakness EXAM: PORTABLE CHEST 1 VIEW COMPARISON:  03/20/2021 FINDINGS: The heart size and mediastinal contours are within normal limits. Both lungs are clear. Partially visualized right shoulder replacement. IMPRESSION: No active disease. Electronically Signed   By: Donavan Foil M.D.   On: 03/27/2021 20:15    EKG: Independently reviewed. EKG - NSR, no acute changes  Assessment/Plan Active Problems:   Near syncope   Hyperlipidemia   Lower urinary tract symptoms (LUTS)   ASVD (arteriosclerotic vascular disease)   Hypothyroidism    1. Near syncope - no neurologic findings. Pt w/ h/o a flutter but no arythmia on telemetry. She does c/o intermittent palpitation. She has had urinary retention - see below - as possible cause near syncope. Also consider orthostatic hypotension.   Plan Cardiac tele admit  Orthostatic vitals q shift  Check for urinary retention q shift  2. ASVD - recent STEMI with PCI/DES stent placement. Post-STEMI echo with EF 35-45%, question of stunned myocardium. Possible cause of weakness.  Plan Cardiac tele admit  Resume low dose metoprolol - 12.5 mg bid  continue Brilinta and ASA  Cardiology consult in AM  3. LUTS - patient has been seen by urology - notes not accessible. Did have cystometrics and bladder exam. No meds started. Had 1600 cc urinary retention on presentation to ED. May have vagal response to bladder distention.  Plan Remove foley placed in ED  Nursing to check for urinary retention q shift  If BP remains stable consider adding flomax 0.4 mg daily  outpt f/u with urology  4. Hypothyroidism - last TSH 11/22/20  Plan Continue home med  TSH  5. HLD - continue rosuvastatin  6. Anxiety - patient has been  hypervigilant, anxious. Consider PTSD post-MI.  Plan If patient remains anxious and is cardiac stable consider adding SSRI   DVT prophylaxis: heparin SQ  Code Status: full code  Family Communication: husband present during interview and exam. Understands Dx plan. Answered all questions  Disposition Plan: home 24-48 hrs  Consults called: cardiology - requested AM consult, spoke with Dr. Rodena Piety, fellow on call Admission status: Obs - tele    Adella Hare MD [facility] Pager [phone]  If 7PM-7AM, please contact night-coverage [email or web address] Password [facility]  03/28/2021, 12:02 AM

## 2021-03-27 NOTE — ED Triage Notes (Signed)
Patient from home, states she has been having palpitations and feeling like she might pass out walking thru her kitchen.  She is having neck pain on the left, no chest pain and dizziness.  Patient has history of STEMI last Sunday, with stents.  Patient was orthostatic and diaphoretic with EMS.  HR 86, BP 146/80.

## 2021-03-27 NOTE — ED Provider Notes (Addendum)
[facility] EMERGENCY DEPARTMENT Provider Note   CSN: [id number] Arrival date & time: 03/27/21  1934     History Chief Complaint  Patient presents with  . Near Syncope  . syncope    Judea Fennimore is a 70 y.o. female.  Patient c/o feeling generally weak, faint, ?momentary syncopal event just pta today. States symptoms gradual onset in past few days after recent d/c from hospital s/p stemi/stents. This evening was standing in kitchen when felt faint, as if may pass out, so sat down on floor to avoid passing out. Intermittent sense of palpitation, lasting 1-2 seconds. Denies sense of persistent fast heart beat. States near syncope accompanied by feeling sweaty, and clammy, with sense of excess gi gas/indigestion. States different from when had stemi when she says had jaw/dental pain and shoulder/arm pain. Denies any pleuritic pain. No cough or uri symptoms. No fever or chills. No abd pain or vomiting/diarrhea. No dysuria or gu c/o. States taking meds as prescribed. No blood loss, rectal bleeding or melena. No headache. No change in speech or vision. No numbness or focal/unilateral weakness.   The history is provided by the patient.  Near Syncope Pertinent negatives include no chest pain, no abdominal pain, no headaches and no shortness of breath.       Past Medical History:  Diagnosis Date  . Allergy   . Anemia   . Arthritis    feet and hands , neck, shoulder  . Dysrhythmia 2006   atrial flutter  . Exogenous obesity   . Heel spur   . Hyperlipidemia   . Hypertension   . Hypothyroid   . Irregular heart beats   . Ischemic cardiomyopathy   . Palpitations   . Prolapsed, anus    uses mag oxide powder nightly   . RSD (reflex sympathetic dystrophy)    after surgery on R foot  . STEMI (ST elevation myocardial infarction) ([place])   . Stress incontinence, female     Patient Active Problem List   Diagnosis Date Noted  . Acute systolic heart failure ([place])  03/22/2021  . Acute anterior wall MI ([place]) 03/20/2021  . Lower urinary tract symptoms (LUTS) 12/01/2020  . Hormone replacement therapy (HRT) 12/01/2020  . Hypothyroidism 12/01/2020  . Presence of orthotic device 12/01/2020  . Dysuria 09/18/2019  . Shoulder pain 09/18/2019  . Healthcare maintenance 05/01/2018  . Allergic urticaria 01/29/2018  . Perennial allergic rhinitis 01/29/2018  . Allergic reaction 01/29/2018  . Advance care planning 03/21/2017  . Unspecified constipation 06/22/2013  . Dyspnea 06/22/2013  . Hyperlipidemia 09/07/2011    Past Surgical History:  Procedure Laterality Date  . ABDOMINAL HYSTERECTOMY  2005  . BUNIONECTOMY    . COLONOSCOPY    . CORONARY STENT INTERVENTION N/A 03/20/2021   Procedure: CORONARY STENT INTERVENTION;  Surgeon: Jettie Booze, MD;  Location: [facility];  Service: Cardiovascular;  Laterality: N/A;  . CORONARY/GRAFT ACUTE MI REVASCULARIZATION N/A 03/20/2021   Procedure: Coronary/Graft Acute MI Revascularization;  Surgeon: Jettie Booze, MD;  Location: [facility];  Service: Cardiovascular;  Laterality: N/A;  . DILATION AND CURETTAGE OF UTERUS    . LEFT HEART CATH AND CORONARY ANGIOGRAPHY N/A 03/20/2021   Procedure: LEFT HEART CATH AND CORONARY ANGIOGRAPHY;  Surgeon: Jettie Booze, MD;  Location: [facility];  Service: Cardiovascular;  Laterality: N/A;  . REVERSE SHOULDER ARTHROPLASTY Right 07/15/2020   Procedure: REVERSE SHOULDER ARTHROPLASTY;  Surgeon: Tania Ade, MD;  Location: [facility];  Service: Orthopedics;  Laterality: Right;     OB History   No obstetric history on file.     Family History  Problem Relation Age of Onset  . Arthritis Mother   . Hypertension Mother   . Colon polyps Mother   . Heart disease Father   . Hypertension Father   . Arthritis Father   . Stroke Father   . Heart failure Brother   . Diabetes Brother   . Pancreatic cancer Brother   . Colon cancer Neg Hx   . Breast  cancer Neg Hx   . Esophageal cancer Neg Hx   . Rectal cancer Neg Hx   . Stomach cancer Neg Hx     Social History   Tobacco Use  . Smoking status: Never Smoker  . Smokeless tobacco: Never Used  Vaping Use  . Vaping Use: Never used  Substance Use Topics  . Alcohol use: No  . Drug use: No    Home Medications Prior to Admission medications   Medication Sig Start Date End Date Taking? Authorizing Provider  Ascorbic Acid (VITAMIN C) 1000 MG tablet Take 1,000 mg by mouth 2 (two) times daily.    [provider]  aspirin 81 MG EC tablet Take 1 tablet (81 mg total) by mouth daily. 03/22/21 03/22/22  Reino Bellis B, NP  b complex vitamins tablet Take 1 tablet by mouth 3 (three) times a week.    [provider]  beta carotene 10000 UNIT capsule Take 10,000 Units by mouth daily.    [provider]  Calcium Carbonate-Vitamin D (CALTRATE 600+D PO) Take by mouth daily.    [provider]  Cholecalciferol (VITAMIN D) 2000 UNITS CAPS Take 1 capsule by mouth 2 (two) times daily.    [provider]  DHEA 25 MG CAPS Take 25 mg by mouth daily.    [provider]  fish oil-omega-3 fatty acids 1000 MG capsule Take 1 g by mouth daily.    [provider]  Magnesium 250 MG TABS Take 250 mg by mouth daily.    [provider]  Melatonin 10 MG TABS Take 10 mg by mouth at bedtime.     [provider]  metoprolol tartrate (LOPRESSOR) 25 MG tablet Take 1 tablet (25 mg total) by mouth 2 (two) times daily. 03/22/21   Cheryln Manly, NP  nitroGLYCERIN (NITROSTAT) 0.4 MG SL tablet Place 1 tablet (0.4 mg total) under the tongue every 5 (five) minutes as needed for chest pain. 03/22/21 03/22/22  Cheryln Manly, NP  NON FORMULARY Progesterone pellet/ estrogen  per outside clinic.    [provider]  Polyethyl Glycol-Propyl Glycol (SYSTANE) 0.4-0.3 % SOLN Apply 1 drop to eye 2 (two) times daily.    [provider]   progesterone (PROMETRIUM) 100 MG capsule Take 300 mg by mouth at bedtime.  03/26/20   [provider]  rosuvastatin (CRESTOR) 40 MG tablet Take 1 tablet (40 mg total) by mouth daily. 03/23/21   Cheryln Manly, NP  thyroid (ARMOUR) 60 MG tablet Take 60-90 mg by mouth See admin instructions. 90mg  in AM and 60mg  in PM    [provider]  ticagrelor (BRILINTA) 90 MG TABS tablet Take 1 tablet (90 mg total) by mouth 2 (two) times daily. 03/22/21   Cheryln Manly, NP  zinc gluconate 50 MG tablet Take 50 mg by mouth daily.    [provider]    Allergies    Albuterol and  Lovastatin  Review of Systems   Review of Systems  Constitutional: Negative for chills and fever.  HENT: Negative for sore throat.   Eyes: Negative for visual disturbance.  Respiratory: Negative for cough and shortness of breath.   Cardiovascular: Positive for palpitations and near-syncope. Negative for chest pain and leg swelling.  Gastrointestinal: Negative for abdominal pain, blood in stool, diarrhea and vomiting.  Genitourinary: Negative for dysuria and flank pain.  Musculoskeletal: Negative for back pain and neck pain.  Skin: Negative for rash.  Neurological: Negative for headaches.  Hematological: Does not bruise/bleed easily.  Psychiatric/Behavioral: Negative for confusion.    Physical Exam Updated Vital Signs BP 129/77   Pulse 90   Temp 98 F (36.7 C) (Oral)   Resp 15   SpO2 100%   Physical Exam Vitals and nursing note reviewed.  Constitutional:      Appearance: Normal appearance. She is well-developed.  HENT:     Head: Atraumatic.     Nose: Nose normal.     Mouth/Throat:     Mouth: Mucous membranes are moist.  Eyes:     General: No scleral icterus.    Conjunctiva/sclera: Conjunctivae normal.     Pupils: Pupils are equal, round, and reactive to light.  Neck:     Vascular: No carotid bruit.     Trachea: No tracheal deviation.  Cardiovascular:     Rate and Rhythm:  Normal rate and regular rhythm.     Pulses: Normal pulses.     Heart sounds: Normal heart sounds. No murmur heard. No friction rub. No gallop.   Pulmonary:     Effort: Pulmonary effort is normal. No respiratory distress.     Breath sounds: Normal breath sounds.  Abdominal:     General: Bowel sounds are normal. There is no distension.     Palpations: Abdomen is soft.     Tenderness: There is no abdominal tenderness. There is no guarding.     Comments: +suprapubic fullness.   Genitourinary:    Comments: No cva tenderness.  Musculoskeletal:        General: No swelling or tenderness.     Cervical back: Normal range of motion and neck supple. No rigidity. No muscular tenderness.     Right lower leg: No edema.     Left lower leg: No edema.  Skin:    General: Skin is warm and dry.     Findings: No rash.  Neurological:     Mental Status: She is alert.     Comments: Alert, speech normal. Motor/sens grossly intact bil.   Psychiatric:        Mood and Affect: Mood normal.     ED Results / Procedures / Treatments   Labs (all labs ordered are listed, but only abnormal results are displayed) Results for orders placed or performed during the hospital encounter of 03/27/21  Resp Panel by RT-PCR (Flu A&B, Covid) Nasopharyngeal Swab   Specimen: Nasopharyngeal Swab; Nasopharyngeal(NP) swabs in vial transport medium  Result Value Ref Range   SARS Coronavirus 2 by RT PCR NEGATIVE NEGATIVE   Influenza A by PCR NEGATIVE NEGATIVE   Influenza B by PCR NEGATIVE NEGATIVE  CBC with Differential/Platelet  Result Value Ref Range   WBC 10.9 (H) 4.0 - 10.5 K/uL   RBC 5.03 3.87 - 5.11 MIL/uL   Hemoglobin 15.3 (H) 12.0 - 15.0 g/dL   HCT 46.1 (H) 36.0 - 46.0 %   MCV 91.7 80.0 - 100.0 fL   MCH 30.4 26.0 -  34.0 pg   MCHC 33.2 30.0 - 36.0 g/dL   RDW 13.1 11.5 - 15.5 %   Platelets 301 150 - 400 K/uL   nRBC 0.0 0.0 - 0.2 %   Neutrophils Relative % 77 %   Neutro Abs 8.6 (H) 1.7 - 7.7 K/uL   Lymphocytes  Relative 14 %   Lymphs Abs 1.5 0.7 - 4.0 K/uL   Monocytes Relative 5 %   Monocytes Absolute 0.6 0.1 - 1.0 K/uL   Eosinophils Relative 2 %   Eosinophils Absolute 0.2 0.0 - 0.5 K/uL   Basophils Relative 1 %   Basophils Absolute 0.1 0.0 - 0.1 K/uL   Immature Granulocytes 1 %   Abs Immature Granulocytes 0.06 0.00 - 0.07 K/uL  Brain natriuretic peptide  Result Value Ref Range   B Natriuretic Peptide 161.8 (H) 0.0 - 100.0 pg/mL  Urinalysis, Routine w reflex microscopic Urine, Catheterized  Result Value Ref Range   Color, Urine STRAW (A) YELLOW   APPearance CLEAR CLEAR   Specific Gravity, Urine 1.003 (L) 1.005 - 1.030   pH 8.0 5.0 - 8.0   Glucose, UA NEGATIVE NEGATIVE mg/dL   Hgb urine dipstick NEGATIVE NEGATIVE   Bilirubin Urine NEGATIVE NEGATIVE   Ketones, ur 5 (A) NEGATIVE mg/dL   Protein, ur NEGATIVE NEGATIVE mg/dL   Nitrite NEGATIVE NEGATIVE   Leukocytes,Ua NEGATIVE NEGATIVE  Comprehensive metabolic panel  Result Value Ref Range   Sodium 140 135 - 145 mmol/L   Potassium 4.0 3.5 - 5.1 mmol/L   Chloride 111 98 - 111 mmol/L   CO2 20 (L) 22 - 32 mmol/L   Glucose, Bld 109 (H) 70 - 99 mg/dL   BUN 12 8 - 23 mg/dL   Creatinine, Ser 0.92 0.44 - 1.00 mg/dL   Calcium 9.1 8.9 - 10.3 mg/dL   Total Protein 6.6 6.5 - 8.1 g/dL   Albumin 3.7 3.5 - 5.0 g/dL   AST 21 15 - 41 U/L   ALT 18 0 - 44 U/L   Alkaline Phosphatase 67 38 - 126 U/L   Total Bilirubin 0.7 0.3 - 1.2 mg/dL   GFR, Estimated >60 >60 mL/min   Anion gap 9 5 - 15  Troponin I (High Sensitivity)  Result Value Ref Range   Troponin I (High Sensitivity) 69 (H) <18 ng/L   CARDIAC CATHETERIZATION  Result Date: 03/20/2021  Mid LAD-1 lesion is 100% stenosed. After thrombectomy, a drug-eluting stent was successfully placed using a SYNERGY XD 3.0X16, postdilated to 3.75 mm.  Post intervention, there is a 0% residual stenosis.  Mid LAD-2 lesion is 95% stenosed. A drug-eluting stent was successfully placed using a SYNERGY XD 2.25X28,  postdilated to 2.5 mm.  Post intervention, there is a 0% residual stenosis.  Mid Cx lesion is 90% stenosed. A drug-eluting stent was successfully placed using a SYNERGY XD 3.50X16, postdilated to 3.75 mm.  Post intervention, there is a 0% residual stenosis.  2nd Diag lesion is 50% stenosed.  Dist LAD lesion is 25% stenosed. The mid to distal LAD was a very small vessel in general.  There is mild to moderate left ventricular systolic dysfunction.  LV end diastolic pressure is moderately elevated. LVEDP 23 mm Hg.  The left ventricular ejection fraction is 35-45% by visual estimate.  There is no aortic valve stenosis.  Continue dual antiplatelet therapy for 12 months.  She will need aggressive secondary prevention.  She had myalgias with lovastatin.  Start rosuvastatin 20 mg daily.  She will need metoprolol  and likely an ACE inhibitor depending on her ejection fraction.  We will plan for echocardiogram on Monday.  No IV fluids due to elevated LVEDP in the setting of anterior wall MI and decreased LVEF. I discussed the findings with her husband.   DG Chest Port 1 View  Result Date: 03/27/2021 CLINICAL DATA:  Weakness EXAM: PORTABLE CHEST 1 VIEW COMPARISON:  03/20/2021 FINDINGS: The heart size and mediastinal contours are within normal limits. Both lungs are clear. Partially visualized right shoulder replacement. IMPRESSION: No active disease. Electronically Signed   By: Donavan Foil M.D.   On: 03/27/2021 20:15   DG Chest Port 1 View  Result Date: 03/20/2021 CLINICAL DATA:  Chest pain EXAM: PORTABLE CHEST 1 VIEW COMPARISON:  07/12/2020 FINDINGS: Heart and mediastinal contours are within normal limits. No focal opacities or effusions. No acute bony abnormality. IMPRESSION: No active disease. Electronically Signed   By: Rolm Baptise M.D.   On: 03/20/2021 02:43   ECHOCARDIOGRAM COMPLETE  Result Date: 03/20/2021    ECHOCARDIOGRAM REPORT   Patient Name:   MELIDA NORTHINGTON Date of Exam: 03/20/2021 Medical Rec #:   [id number]     Height:       64.0 in Accession #:    [id number]    Weight:       166.7 lb Date of Birth:  10/01/1951      BSA:          1.811 m Patient Age:    26 years      BP:           104/67 mmHg Patient Gender: F             HR:           97 bpm. Exam Location:  Inpatient Procedure: 2D Echo, Cardiac Doppler and Color Doppler Indications:    Acute myocardial infarction  History:        Patient has no prior history of Echocardiogram examinations.                 Risk Factors:Hypertension and Dyslipidemia.  Sonographer:    Clayton Lefort RDCS (AE) Referring Phys: [id number] MARK C SKAINS IMPRESSIONS  1. Left ventricular ejection fraction, by estimation, is 40 to 45%. The left ventricle has mildly decreased function. The left ventricle has no regional wall motion abnormalities. There is mild left ventricular hypertrophy. Left ventricular diastolic parameters are consistent with Grade I diastolic dysfunction (impaired relaxation).  2. Right ventricular systolic function is normal. The right ventricular size is normal. There is mildly elevated pulmonary artery systolic pressure.  3. The mitral valve is normal in structure. No evidence of mitral valve regurgitation. No evidence of mitral stenosis.  4. The aortic valve is normal in structure. Aortic valve regurgitation is not visualized. No aortic stenosis is present.  5. The inferior vena cava is dilated in size with <50% respiratory variability, suggesting right atrial pressure of 15 mmHg. FINDINGS  Left Ventricle: Left ventricular ejection fraction, by estimation, is 40 to 45%. The left ventricle has mildly decreased function. The left ventricle has no regional wall motion abnormalities. Definity contrast agent was given IV to delineate the left ventricular endocardial borders. The left ventricular internal cavity size was normal in size. There is mild left ventricular hypertrophy. Left ventricular diastolic parameters are consistent with Grade I diastolic dysfunction  (impaired relaxation).  LV Wall Scoring: The apical inferior segment and apex are akinetic. The entire anterior septum and mid inferoseptal segment are hypokinetic. Right Ventricle:  The right ventricular size is normal. No increase in right ventricular wall thickness. Right ventricular systolic function is normal. There is mildly elevated pulmonary artery systolic pressure. The tricuspid regurgitant velocity is 2.45  m/s, and with an assumed right atrial pressure of 15 mmHg, the estimated right ventricular systolic pressure is 96.2 mmHg. Left Atrium: Left atrial size was normal in size. Right Atrium: Right atrial size was normal in size. Pericardium: There is no evidence of pericardial effusion. Mitral Valve: The mitral valve is normal in structure. No evidence of mitral valve regurgitation. No evidence of mitral valve stenosis. MV peak gradient, 6.8 mmHg. The mean mitral valve gradient is 2.0 mmHg. Tricuspid Valve: The tricuspid valve is normal in structure. Tricuspid valve regurgitation is trivial. No evidence of tricuspid stenosis. Aortic Valve: The aortic valve is normal in structure. Aortic valve regurgitation is not visualized. No aortic stenosis is present. Aortic valve mean gradient measures 4.0 mmHg. Aortic valve peak gradient measures 6.8 mmHg. Aortic valve area, by VTI measures 2.46 cm. Pulmonic Valve: The pulmonic valve was normal in structure. Pulmonic valve regurgitation is not visualized. No evidence of pulmonic stenosis. Aorta: The aortic root is normal in size and structure. Venous: The inferior vena cava is dilated in size with less than 50% respiratory variability, suggesting right atrial pressure of 15 mmHg. IAS/Shunts: No atrial level shunt detected by color flow Doppler.  LEFT VENTRICLE PLAX 2D LVIDd:         4.20 cm      Diastology LVIDs:         3.20 cm      LV e' medial:    5.87 cm/s LV PW:         1.20 cm      LV E/e' medial:  12.5 LV IVS:        1.10 cm      LV e' lateral:   6.85 cm/s  LVOT diam:     1.85 cm      LV E/e' lateral: 10.7 LV SV:         61 LV SV Index:   34 LVOT Area:     2.69 cm  LV Volumes (MOD) LV vol d, MOD A2C: 105.0 ml LV vol d, MOD A4C: 78.6 ml LV vol s, MOD A2C: 60.7 ml LV vol s, MOD A4C: 38.9 ml LV SV MOD A2C:     44.3 ml LV SV MOD A4C:     78.6 ml LV SV MOD BP:      44.0 ml RIGHT VENTRICLE             IVC RV Basal diam:  2.60 cm     IVC diam: 2.20 cm RV S prime:     17.60 cm/s TAPSE (M-mode): 2.2 cm LEFT ATRIUM             Index       RIGHT ATRIUM           Index LA diam:        2.80 cm 1.55 cm/m  RA Area:     11.40 cm LA Vol (A2C):   30.3 ml 16.74 ml/m RA Volume:   26.20 ml  14.47 ml/m LA Vol (A4C):   24.8 ml 13.70 ml/m LA Biplane Vol: 28.1 ml 15.52 ml/m  AORTIC VALVE AV Area (Vmax):    2.54 cm AV Area (Vmean):   1.99 cm AV Area (VTI):     2.46 cm AV Vmax:  130.00 cm/s AV Vmean:          93.200 cm/s AV VTI:            0.249 m AV Peak Grad:      6.8 mmHg AV Mean Grad:      4.0 mmHg LVOT Vmax:         123.00 cm/s LVOT Vmean:        69.100 cm/s LVOT VTI:          0.228 m LVOT/AV VTI ratio: 0.92  AORTA Ao Root diam: 3.10 cm Ao Asc diam:  3.30 cm MITRAL VALVE                TRICUSPID VALVE MV Area (PHT): 3.37 cm     TR Peak grad:   24.0 mmHg MV Area VTI:   2.68 cm     TR Vmax:        245.00 cm/s MV Peak grad:  6.8 mmHg MV Mean grad:  2.0 mmHg     SHUNTS MV Vmax:       1.30 m/s     Systemic VTI:  0.23 m MV Vmean:      65.1 cm/s    Systemic Diam: 1.85 cm MV Decel Time: 225 msec MV E velocity: 73.10 cm/s MV A velocity: 118.00 cm/s MV E/A ratio:  0.62 Candee Furbish MD Electronically signed by Candee Furbish MD Signature Date/Time: 03/20/2021/4:27:59 PM    Final     EKG EKG Interpretation  Date/Time:  Sunday March 27 2021 19:45:44 EDT Ventricular Rate:  89 PR Interval:  149 QRS Duration: 90 QT Interval:  378 QTC Calculation: 460 R Axis:   260 Text Interpretation: Sinus rhythm Left anterior fascicular block Non-specific ST-t changes Confirmed by Lajean Saver [phone]) on 03/27/2021 7:49:41 PM   Radiology DG Chest Port 1 View  Result Date: 03/27/2021 CLINICAL DATA:  Weakness EXAM: PORTABLE CHEST 1 VIEW COMPARISON:  03/20/2021 FINDINGS: The heart size and mediastinal contours are within normal limits. Both lungs are clear. Partially visualized right shoulder replacement. IMPRESSION: No active disease. Electronically Signed   By: Donavan Foil M.D.   On: 03/27/2021 20:15    Procedures Procedures   Medications Ordered in ED Medications - No data to display  ED Course  I have reviewed the triage vital signs and the nursing notes.  Pertinent labs & imaging results that were available during my care of the patient were reviewed by me and considered in my medical decision making (see chart for details).    MDM Rules/Calculators/A&P                         Iv ns. Continuous pulse ox and cardiac monitoring. Stat labs. Ecg. Cxr.   Reviewed nursing notes and prior charts for additional history.   Labs reviewed/interpreted by me - chem normal.   CXR reviewed/interpreted by me - no pna.   Bladder scan, > 1000 cc.   Foley - pvr ~1600 cc urine. ?whether possibly vagal effect related to overdistended bladder contributing to syncope/recurrent near syncope, along with recent MI ?depressed EF as compared to baseline, recent medication changes, etc. - suspect multifactorial recurrent near syncopal events.   Spouse notes recurrent near syncopal events in past few days, and that today was 'the worst'.  Indicates ~ 3 weeks ago saw urology for issues with voiding, being unable to empty bladder - indicates no specific dx then, but was told needed to try and  fully relax when voiding so as to be able to empty bladder.   Pt is feeling improved currently. nsr on monitor. Po fluids/ food.     Given syncope this evening, recent multiple near syncopal episodes, will admit/obs for monitoring.   Medicine service consulted for admission.    Final Clinical  Impression(s) / ED Diagnoses Final diagnoses:  None    Rx / DC Orders ED Discharge Orders    None           Lajean Saver, MD 03/27/21 2231

## 2021-03-28 DIAGNOSIS — I255 Ischemic cardiomyopathy: Secondary | ICD-10-CM | POA: Diagnosis not present

## 2021-03-28 DIAGNOSIS — E785 Hyperlipidemia, unspecified: Secondary | ICD-10-CM

## 2021-03-28 DIAGNOSIS — R55 Syncope and collapse: Secondary | ICD-10-CM | POA: Diagnosis not present

## 2021-03-28 DIAGNOSIS — E039 Hypothyroidism, unspecified: Secondary | ICD-10-CM | POA: Diagnosis not present

## 2021-03-28 DIAGNOSIS — I214 Non-ST elevation (NSTEMI) myocardial infarction: Secondary | ICD-10-CM | POA: Diagnosis not present

## 2021-03-28 DIAGNOSIS — R399 Unspecified symptoms and signs involving the genitourinary system: Secondary | ICD-10-CM

## 2021-03-28 DIAGNOSIS — I2510 Atherosclerotic heart disease of native coronary artery without angina pectoris: Secondary | ICD-10-CM | POA: Diagnosis present

## 2021-03-28 DIAGNOSIS — I7090 Unspecified atherosclerosis: Secondary | ICD-10-CM | POA: Diagnosis not present

## 2021-03-28 DIAGNOSIS — I2102 ST elevation (STEMI) myocardial infarction involving left anterior descending coronary artery: Secondary | ICD-10-CM

## 2021-03-28 LAB — GLUCOSE, CAPILLARY: Glucose-Capillary: 101 mg/dL — ABNORMAL HIGH (ref 70–99)

## 2021-03-28 LAB — TSH: TSH: 0.15 u[IU]/mL — ABNORMAL LOW (ref 0.350–4.500)

## 2021-03-28 MED ORDER — THYROID 60 MG PO TABS
60.0000 mg | ORAL_TABLET | Freq: Every day | ORAL | Status: DC
  Administered 2021-03-29: 60 mg via ORAL
  Filled 2021-03-28: qty 1

## 2021-03-28 MED ORDER — ENOXAPARIN SODIUM 40 MG/0.4ML ~~LOC~~ SOLN
40.0000 mg | SUBCUTANEOUS | Status: DC
  Administered 2021-03-28: 40 mg via SUBCUTANEOUS
  Filled 2021-03-28: qty 0.4

## 2021-03-28 MED ORDER — THYROID 60 MG PO TABS
60.0000 mg | ORAL_TABLET | Freq: Every day | ORAL | Status: DC
  Administered 2021-03-28: 60 mg via ORAL
  Filled 2021-03-28: qty 1

## 2021-03-28 NOTE — Consult Note (Addendum)
Cardiology Consultation:   Patient ID: Kim Brown MRN: [id number]; DOB: June 16, 1951  Admit date: 03/27/2021 Date of Consult: 03/28/2021  PCP:  Tonia Ghent, MD   [place]  Cardiologist:  Larae Grooms, MD Patient Profile:   Kim Brown is a 70 y.o. female with a hx of recent STEMI s/p DES x3, hyperlipidemia, ischemic cardiomyopathy, and hypothyroidism who is being seen today for the evaluation of near syncope at the request of Dr. Veverly Fells.  Admitted last week with anterior lateral STEMI. Underwent cardiac cath with total of 3 lesions treated as follows: 1.) mLAD of 100% lesion which was successfully treated with PCI/DESx1 along with thrombectomy 2.) mLAD of 95% with PCI/DESx1 3.) mLCx of 90% with PCI/DESx1. She does have residual mild to moderate non-obstructive in 2nd diag, and distal LAD to be treated medically. EF noted at 40-45% with no rWMA noted, G1DD.  Added low dose beta-blocker.  Unable to add ACE or ARB secondary to soft blood pressure.  Prior history of myalgias with lovastatin but agreeable to start on Crestor.  History of Present Illness:   Kim Brown was recovering gradually however noted to have fatigue, clammy sweats, belching and intermittent dizziness.  Blood pressure and heart rate was relatively stable.  She called our office and recommended to stop metoprolol.  She did not took her beta-blocker on 4/8.  On 4/90 resume on lower dose at 12.5 mg twice daily.  Patient continues to felt weak.  Yesterday evening patient had near syncope episode while standing in the kitchen.  Patient suddenly felt weak while standing.  Saw double vision without slurred speech or one-sided of weakness.  No loss of consciousness.  Unable to tell if she had palpitation at that time or not.  EMS was called and patient was somewhat orthostatic.  In ER her vital signs was stable.  Patient was unable to avoid so bladder scan showed fluid retention with 1600 cc in urine  removal.  Patient was admitted for further evaluation.  Patient reports symptoms is different than her prior admission.  BNP 161 High-sensitivity troponin 69>> 75 (almost 20,000 during last admission) Creatinine and electrolytes within normal limit TSH is low  Chest x-ray without acute disease  Past Medical History:  Diagnosis Date   Allergy    Anemia    Arthritis    feet and hands , neck, shoulder   Dysrhythmia 2006   atrial flutter   Exogenous obesity    Heel spur    Hyperlipidemia    Hypertension    Hypothyroid    Irregular heart beats    Ischemic cardiomyopathy    Palpitations    Prolapsed, anus    uses mag oxide powder nightly    RSD (reflex sympathetic dystrophy)    after surgery on R foot   STEMI (ST elevation myocardial infarction) ([place])    Stress incontinence, female     Past Surgical History:  Procedure Laterality Date   ABDOMINAL HYSTERECTOMY  2005   BUNIONECTOMY     COLONOSCOPY     CORONARY STENT INTERVENTION N/A 03/20/2021   Procedure: CORONARY STENT INTERVENTION;  Surgeon: Jettie Booze, MD;  Location: [facility];  Service: Cardiovascular;  Laterality: N/A;   CORONARY/GRAFT ACUTE MI REVASCULARIZATION N/A 03/20/2021   Procedure: Coronary/Graft Acute MI Revascularization;  Surgeon: Jettie Booze, MD;  Location: [facility];  Service: Cardiovascular;  Laterality: N/A;   DILATION AND CURETTAGE OF UTERUS     LEFT HEART CATH  AND CORONARY ANGIOGRAPHY N/A 03/20/2021   Procedure: LEFT HEART CATH AND CORONARY ANGIOGRAPHY;  Surgeon: Jettie Booze, MD;  Location: [facility];  Service: Cardiovascular;  Laterality: N/A;   REVERSE SHOULDER ARTHROPLASTY Right 07/15/2020   Procedure: REVERSE SHOULDER ARTHROPLASTY;  Surgeon: Tania Ade, MD;  Location: [facility];  Service: Orthopedics;  Laterality: Right;      Inpatient Medications: Scheduled Meds:  aspirin EC  81 mg Oral Daily   heparin  5,000 Units Subcutaneous Q8H   melatonin   10 mg Oral QHS   metoprolol tartrate  12.5 mg Oral BID   progesterone  300 mg Oral QHS   rosuvastatin  40 mg Oral Daily   thyroid  90 mg Oral QAC breakfast   And   thyroid  60 mg Oral QAC supper   ticagrelor  90 mg Oral BID   Continuous Infusions:  sodium chloride 50 mL/hr at 03/28/21 0127   PRN Meds: acetaminophen **OR** acetaminophen, nitroGLYCERIN  Allergies:    Allergies  Allergen Reactions   Albuterol     Jittery sensation   Lovastatin     Myalgias    Social History:   Social History   Socioeconomic History   Marital status: Married    Spouse name: Not on file   Number of children: Not on file   Years of education: Not on file   Highest education level: Not on file  Occupational History   Not on file  Tobacco Use   Smoking status: Never Smoker   Smokeless tobacco: Never Used  Vaping Use   Vaping Use: Never used  Substance and Sexual Activity   Alcohol use: No   Drug use: No   Sexual activity: Yes  Other Topics Concern   Not on file  Social History Narrative   Married 1972   2 daughters (Lucinda Dell is one) and 1 son   74 grandkids    Occ subs at [employer]   Social Determinants of Radio broadcast assistant Strain: Low Risk    Difficulty of Paying Living Expenses: Not hard at all  Food Insecurity: No Food Insecurity   Worried About Charity fundraiser in the Last Year: Never true   Arboriculturist in the Last Year: Never true  Transportation Needs: No Transportation Needs   Lack of Transportation (Medical): No   Lack of Transportation (Non-Medical): No  Physical Activity: Insufficiently Active   Days of Exercise per Week: 7 days   Minutes of Exercise per Session: 10 min  Stress: No Stress Concern Present   Feeling of Stress : Not at all  Social Connections: Not on file  Intimate Partner Violence: Not At Risk   Fear of Current or Ex-Partner: No   Emotionally Abused: No   Physically Abused: No   Sexually Abused: No    Family  History:   Family History  Problem Relation Age of Onset   Arthritis Mother    Hypertension Mother    Colon polyps Mother    Heart disease Father    Hypertension Father    Arthritis Father    Stroke Father    Heart failure Brother    Diabetes Brother    Pancreatic cancer Brother    Colon cancer Neg Hx    Breast cancer Neg Hx    Esophageal cancer Neg Hx    Rectal cancer Neg Hx    Stomach cancer Neg Hx      ROS:  Please  see the history of present illness.  All other ROS reviewed and negative.     Physical Exam/Data:   Vitals:   03/28/21 0043 03/28/21 0441 03/28/21 0837 03/28/21 0955  BP: 132/77 126/77 140/74 (!) 127/93  Pulse: 88 90 89 77  Resp: 20 18 18 19   Temp: 98.1 F (36.7 C) 98 F (36.7 C) 98.9 F (37.2 C) 98.6 F (37 C)  TempSrc: Oral Oral Oral Oral  SpO2: 100% 97% 96% 100%  Weight:  71 kg      Intake/Output Summary (Last 24 hours) at 03/28/2021 1054 Last data filed at 03/28/2021 0400 Gross per 24 hour  Intake 363.78 ml  Output 1700 ml  Net -1336.22 ml   Last 3 Weights 03/28/2021 03/22/2021 03/21/2021  Weight (lbs) 156 lb 8 oz 158 lb 159 lb 14.4 oz  Weight (kg) 70.988 kg 71.668 kg 72.53 kg     Body mass index is 26.86 kg/m.  General:  Well nourished, well developed, in no acute distress HEENT: normal Lymph: no adenopathy Neck: no JVD Endocrine:  No thryomegaly Vascular: No carotid bruits; FA pulses 2+ bilaterally without bruits  Cardiac:  normal S1, S2; RRR; no murmur  Lungs:  clear to auscultation bilaterally, no wheezing, rhonchi or rales  Abd: soft, nontender, no hepatomegaly  Ext: no edema Musculoskeletal:  No deformities, BUE and BLE strength normal and equal Skin: warm and dry  Neuro:  CNs 2-12 intact, no focal abnormalities noted Psych:  Normal affect   EKG:  The EKG was personally reviewed and demonstrates: Normal sinus rhythm, LAFB, ossific ST/T wave change Telemetry:  Telemetry was personally reviewed and demonstrates: Sinus  rhythm/tachycardia  Relevant CV Studies:  Cath: 03/20/21    Mid LAD-1 lesion is 100% stenosed. After thrombectomy, a drug-eluting stent was successfully placed using a SYNERGY XD 3.0X16, postdilated to 3.75 mm. Post intervention, there is a 0% residual stenosis. Mid LAD-2 lesion is 95% stenosed. A drug-eluting stent was successfully placed using a SYNERGY XD 2.25X28, postdilated to 2.5 mm. Post intervention, there is a 0% residual stenosis. Mid Cx lesion is 90% stenosed. A drug-eluting stent was successfully placed using a SYNERGY XD 3.50X16, postdilated to 3.75 mm. Post intervention, there is a 0% residual stenosis. 2nd Diag lesion is 50% stenosed. Dist LAD lesion is 25% stenosed. The mid to distal LAD was a very small vessel in general. There is mild to moderate left ventricular systolic dysfunction. LV end diastolic pressure is moderately elevated. LVEDP 23 mm Hg. The left ventricular ejection fraction is 35-45% by visual estimate. There is no aortic valve stenosis.   Continue dual antiplatelet therapy for 12 months.  She will need aggressive secondary prevention.  She had myalgias with lovastatin.  Start rosuvastatin 20 mg daily.  She will need metoprolol and likely an ACE inhibitor depending on her ejection fraction.  We will plan for echocardiogram on Monday.  No IV fluids due to elevated LVEDP in the setting of anterior wall MI and decreased LVEF.   I discussed the findings with her husband.   Diagnostic Dominance: Left      Intervention      Echo: 03/20/21   IMPRESSIONS     1. Left ventricular ejection fraction, by estimation, is 40 to 45%. The  left ventricle has mildly decreased function. The left ventricle has no  regional wall motion abnormalities. There is mild left ventricular  hypertrophy. Left ventricular diastolic  parameters are consistent with Grade I diastolic dysfunction (impaired  relaxation).   2.  Right ventricular systolic function is normal. The right  ventricular  size is normal. There is mildly elevated pulmonary artery systolic  pressure.   3. The mitral valve is normal in structure. No evidence of mitral valve  regurgitation. No evidence of mitral stenosis.   4. The aortic valve is normal in structure. Aortic valve regurgitation is  not visualized. No aortic stenosis is present.   5. The inferior vena cava is dilated in size with <50% respiratory  variability, suggesting right atrial pressure of 15 mmHg.  _____________   Laboratory Data:  High Sensitivity Troponin:   Recent Labs  Lab 03/20/21 0219 03/20/21 0549 03/21/21 0536 03/27/21 2021 03/27/21 2115  TROPONINIHS 243* 7,259* 19,551* 69* 75*     Chemistry Recent Labs  Lab 03/27/21 2021  NA 140  K 4.0  CL 111  CO2 20*  GLUCOSE 109*  BUN 12  CREATININE 0.92  CALCIUM 9.1  GFRNONAA >60  ANIONGAP 9    Recent Labs  Lab 03/27/21 2021  PROT 6.6  ALBUMIN 3.7  AST 21  ALT 18  ALKPHOS 67  BILITOT 0.7   Hematology Recent Labs  Lab 03/27/21 2021  WBC 10.9*  RBC 5.03  HGB 15.3*  HCT 46.1*  MCV 91.7  MCH 30.4  MCHC 33.2  RDW 13.1  PLT 301   BNP Recent Labs  Lab 03/27/21 2021  BNP 161.8*    Radiology/Studies:  DG Chest Port 1 View  Result Date: 03/27/2021 CLINICAL DATA:  Weakness EXAM: PORTABLE CHEST 1 VIEW COMPARISON:  03/20/2021 FINDINGS: The heart size and mediastinal contours are within normal limits. Both lungs are clear. Partially visualized right shoulder replacement. IMPRESSION: No active disease. Electronically Signed   By: Donavan Foil M.D.   On: 03/27/2021 20:15     Assessment and Plan:   Near syncope -No loss of consciousness.  Patient with fatigue, calming feeling and intermittent palpitation.  Found to have urinary retention in emergency room with 1600 cc removal.   -This could have vasovagal mediated near syncope given urinary retention -Differential includes arrhythmia given recent STEMI and intermittent  palpitation -Presentation not consistent with ACS -Her thyroid function is low>> this could be contributing -Check orthostatic -Can consider PT  2.  CAD s/p DES x to LAD and DES to mid circumflex -Her nonobstructive CAD treated medically -No chest pain -Current symptoms different than STEMI presentation -Continue aspirin, Brilinta, statin and beta-blocker  3.  Ischemic cardiomyopathy -Echocardiogram with LV function of 40 to 45% and grade 1 diastolic dysfunction -Not volume overload on exam -Beta-blocker resume at lower dose, see below -No ACE or ARB given soft blood pressure and ongoing issue  4.  Tachycardia -Reports intermittent palpitation lasting for few seconds -Telemetry  with mostly sinus rhythm.  Intermittent tachycardia in 120 to 30 bpm, seems underlying rhythm of sinus tach.  Will review with MD. -Metoprolol tartrate has resumed on lower dose at 12.5 mg twice daily  5.  Hypothyroidism -TSH is low -Per primary team  6.  Hyperlipidemia -History of myalgia on lovastatin -Currently tolerating Crestor - 03/20/2021: Cholesterol 240; HDL 44; LDL Cholesterol 163; Triglycerides 167; VLDL 33   7.  Urinary retention  -S/p 1600cc removal - Per attending team   Risk Assessment/Risk Scores:    For questions or updates, please contact [facility] Please consult [email or web address] for contact info under    Jarrett Soho, PA  03/28/2021 10:54 AM  Personally seen and examined. Agree with APP above with the following comments:  Briefly 70 yo F with a history of recent NSTEMI and subsequent ischemic cardiymopathy who presented with near syncope Patient notes that 03/27/21 was going to reach for a saucer to give her 10 year grandchild a snack when she fells change in her vision with dizziness; was able to grab onto the kitchen counter to prevent a fall.  Prior has backed off of her BB by [facility] because of fatigue post STEMI. Exam notable for regular rate and no murmurs  (benign exam and was unable to illicit symptoms with positional changes). Labs notable for for novel troponin < 100 Personally reviewed relevant tests; telemetry shows small 4 beat run of NSVT; patient was not symptomatic at this time (726) Would recommend - ASA, Ticagrelor, and low dose BB (12.5 mg PO BID appears to be good dose between orthostatic concerns and for CAD and NSVT) - if significant symptomatic ectopy will send home onlive heart monitor - given labile BP will not start ACE/ARB. - Agree with PT; concern than patient is significantly deconditioned form prior STEMI - if new changes in hemoydynamics, low threshold to repeat echo  Rudean Haskell, MD [address] [place], [place] [address] [phone]  12:34 PM

## 2021-03-28 NOTE — Progress Notes (Signed)
PROGRESS NOTE    Kim Brown  UXN:[id number] DOB: 02-02-1951 DOA: 03/27/2021 PCP: Tonia Ghent, MD    Brief Narrative:  Kim Brown was admitted to the hospital working diagnosis of near syncope.  70 year old female past medical history of atrial flutter, hypertension, dyslipidemia, hypothyroidism, and coronary artery disease status post ST elevation myocardial infarction 03/20/2021, status post PCI angioplasty, LAD, circumflex.  After her discharge she felt generalized weakness and difficulty ambulating.  As an outpatient she was advised to discontinue her beta-blockade.  At home she continued to have near syncope episodes that prompted her to come to the hospital.  On her initial physical examination temperature 98, blood pressure 128/77, heart rate 90, respiratory rate 15, oxygenation 100%, lungs clear to auscultation bilaterally, heart S1-S2, present rhythmic, soft abdomen, no lower extremity edema.  Sodium 140, potassium 4.0, chloride 111, bicarb 20, glucose 109, BUN 12, creatinine 0.92, BNP 161, troponin I 69-75, white count 10.9, hemoglobin 15.3, hematocrit 46.1, platelets 301. SARS COVID-19 negative. Urine analysis with specific gravity 1.003.  Chest radiograph no infiltrates. EKG 89 bpm, left axis deviation, left anterior fascicular block, sinus rhythm, Q-wave lead V1-V2, no ST segment or T wave changes.  Orthostatic vital signs lying: 122/76, heart rate 82, sitting: 130/81, heart rate 94, standing: 128/87 heart rate 92.  Assessment & Plan:   Active Problems:   Hyperlipidemia   Lower urinary tract symptoms (LUTS)   Hypothyroidism   Near syncope   ASVD (arteriosclerotic vascular disease)   1. Near syncope episode, possible beta blockade induced vs vasovagal.  Continue close telemetry monitoring and PT/OT evaluation.   Discontinue IV fluids.   2. CAD sp recent angioplasty to LAD and Cx. Patient chest pain free, continue with dual antiplatelet therapy and will resume B  blockade with carvedilol.  3. Urinary retention. Removed Foley catheter, continue to monitor voiding.   4. Hypothyroid. Continue with levothyroxine at a lower dose due to low TSH.   5. Dyslipidemia. Continue with statin therapy.  6. Anxiety. Continue close monitoring      Status is: Observation  The patient remains OBS appropriate and will d/c before 2 midnights.  Dispo: The patient is from: Home              Anticipated d/c is to: Home              Patient currently is not medically stable to d/c.   Difficult to place patient No   DVT prophylaxis: Enoxaparin   Code Status:   full  Family Communication:  I spoke with patient's granddaughter at the bedside, we talked in detail about patient's condition, plan of care and prognosis and all questions were addressed.     Consultants:   Cardiology     Subjective: Patient is feeling better but not yet back to baseline, continue to be very weak and deconditioned, no chest pain or dyspnea   Objective: Vitals:   03/28/21 0441 03/28/21 0837 03/28/21 0955 03/28/21 1144  BP: 126/77 140/74 (!) 127/93 121/74  Pulse: 90 89 77 76  Resp: 18 18 19 18   Temp: 98 F (36.7 C) 98.9 F (37.2 C) 98.6 F (37 C) 98.6 F (37 C)  TempSrc: Oral Oral Oral Oral  SpO2: 97% 96% 100% 99%  Weight: 71 kg       Intake/Output Summary (Last 24 hours) at 03/28/2021 1400 Last data filed at 03/28/2021 0400 Gross per 24 hour  Intake 363.78 ml  Output 1700 ml  Net -1336.22 ml  Filed Weights   03/28/21 0441  Weight: 71 kg    Examination:   General: Not in pain or dyspnea  Neurology: Awake and alert, non focal  E ENT: mild pallor, no icterus, oral mucosa moist Cardiovascular: No JVD. S1-S2 present, rhythmic, no gallops, rubs, or murmurs. No lower extremity edema. Pulmonary: positive breath sounds bilaterally, adequate air movement, no wheezing, rhonchi or rales. Gastrointestinal. Abdomen soft and non tender Skin. No rashes Musculoskeletal: no  joint deformities     Data Reviewed: I have personally reviewed following labs and imaging studies  CBC: Recent Labs  Lab 03/27/21 2021  WBC 10.9*  NEUTROABS 8.6*  HGB 15.3*  HCT 46.1*  MCV 91.7  PLT 993   Basic Metabolic Panel: Recent Labs  Lab 03/27/21 2021  NA 140  K 4.0  CL 111  CO2 20*  GLUCOSE 109*  BUN 12  CREATININE 0.92  CALCIUM 9.1   GFR: Estimated Creatinine Clearance: 55.8 mL/min (by C-G formula based on SCr of 0.92 mg/dL). Liver Function Tests: Recent Labs  Lab 03/27/21 2021  AST 21  ALT 18  ALKPHOS 67  BILITOT 0.7  PROT 6.6  ALBUMIN 3.7   No results for input(s): LIPASE, AMYLASE in the last 168 hours. No results for input(s): AMMONIA in the last 168 hours. Coagulation Profile: No results for input(s): INR, PROTIME in the last 168 hours. Cardiac Enzymes: No results for input(s): CKTOTAL, CKMB, CKMBINDEX, TROPONINI in the last 168 hours. BNP (last 3 results) No results for input(s): PROBNP in the last 8760 hours. HbA1C: No results for input(s): HGBA1C in the last 72 hours. CBG: Recent Labs  Lab 03/28/21 0610  GLUCAP 101*   Lipid Profile: No results for input(s): CHOL, HDL, LDLCALC, TRIG, CHOLHDL, LDLDIRECT in the last 72 hours. Thyroid Function Tests: Recent Labs    03/28/21 0344  TSH 0.150*   Anemia Panel: No results for input(s): VITAMINB12, FOLATE, FERRITIN, TIBC, IRON, RETICCTPCT in the last 72 hours.    Radiology Studies: I have reviewed all of the imaging during this hospital visit personally     Scheduled Meds: . aspirin EC  81 mg Oral Daily  . heparin  5,000 Units Subcutaneous Q8H  . melatonin  10 mg Oral QHS  . metoprolol tartrate  12.5 mg Oral BID  . progesterone  300 mg Oral QHS  . rosuvastatin  40 mg Oral Daily  . thyroid  90 mg Oral QAC breakfast   And  . thyroid  60 mg Oral QAC supper  . ticagrelor  90 mg Oral BID   Continuous Infusions: . sodium chloride 50 mL/hr at 03/28/21 0127     LOS: 0 days         Adell Koval Gerome Apley, MD

## 2021-03-29 ENCOUNTER — Telehealth (HOSPITAL_COMMUNITY): Payer: Self-pay

## 2021-03-29 DIAGNOSIS — E039 Hypothyroidism, unspecified: Secondary | ICD-10-CM | POA: Diagnosis not present

## 2021-03-29 DIAGNOSIS — I7090 Unspecified atherosclerosis: Secondary | ICD-10-CM | POA: Diagnosis not present

## 2021-03-29 DIAGNOSIS — I214 Non-ST elevation (NSTEMI) myocardial infarction: Secondary | ICD-10-CM | POA: Diagnosis not present

## 2021-03-29 DIAGNOSIS — R55 Syncope and collapse: Secondary | ICD-10-CM | POA: Diagnosis not present

## 2021-03-29 DIAGNOSIS — I255 Ischemic cardiomyopathy: Secondary | ICD-10-CM | POA: Diagnosis not present

## 2021-03-29 DIAGNOSIS — E785 Hyperlipidemia, unspecified: Secondary | ICD-10-CM | POA: Diagnosis not present

## 2021-03-29 DIAGNOSIS — I2102 ST elevation (STEMI) myocardial infarction involving left anterior descending coronary artery: Secondary | ICD-10-CM | POA: Diagnosis not present

## 2021-03-29 DIAGNOSIS — R399 Unspecified symptoms and signs involving the genitourinary system: Secondary | ICD-10-CM | POA: Diagnosis not present

## 2021-03-29 LAB — GLUCOSE, CAPILLARY: Glucose-Capillary: 96 mg/dL (ref 70–99)

## 2021-03-29 MED ORDER — DEXTROSE 50 % IV SOLN
INTRAVENOUS | Status: AC
  Filled 2021-03-29: qty 50

## 2021-03-29 MED ORDER — METOPROLOL TARTRATE 25 MG PO TABS: 12.5000 mg | ORAL_TABLET | Freq: Two times a day (BID) | ORAL | 0 refills | Status: DC

## 2021-03-29 MED ORDER — THYROID 60 MG PO TABS: ORAL_TABLET | ORAL | 0 refills | Status: DC

## 2021-03-29 NOTE — Plan of Care (Signed)
  Problem: Education: Goal: Knowledge of General Education information will improve Description: Including pain rating scale, medication(s)/side effects and non-pharmacologic comfort measures Outcome: Adequate for Discharge   

## 2021-03-29 NOTE — Progress Notes (Signed)
Discharge instructions (including medications) discussed with and copy provided to patient/caregiver 

## 2021-03-29 NOTE — Evaluation (Signed)
Physical Therapy Evaluation Patient Details Name: Kim Brown MRN: [id number] DOB: 04/06/1951 Today's Date: 03/29/2021   History of Present Illness  69yo female admitted 4/10 with c/o weakness and near syncope. Told to hold beta blocker by OP cardiologist. Admitted for w/u of near syncope. PMH atrial flutter, HLD, HTN, ischemic cardiomyopathy, STEMI, CABG, reverse shoulder arthroscopy  Clinical Impression   Patient received in bed, very pleasant and cooperative this morning. Able to mobilize on an independent to distant supervision level, moves well with no AD- just generally weak and appears deconditioned. Reports her near syncopal episodes were associated with looking up into kitchen cabinets, and interestingly, able to somewhat reproduce pre-syncopal symptoms with active cervical extension and rotation with symptoms resolving when cervical spine returned to neutral position. Education provided on importance of pausing/not rushing positional transitions with mobility, also educated to f/u with PCP on dizziness/possible vertebral artery involvement given provoking positions. Left in bed with all needs met. No need for further PT services in acute setting, would benefit from OP PT f/u. Signing off, thank you for the opportunity to participate in her care!     Follow Up Recommendations Outpatient PT    Equipment Recommendations  None recommended by PT    Recommendations for Other Services       Precautions / Restrictions Precautions Precautions: None Restrictions Weight Bearing Restrictions: No      Mobility  Bed Mobility Overal bed mobility: Independent                  Transfers Overall transfer level: Independent Equipment used: None             General transfer comment: good safety awareness  Ambulation/Gait Ambulation/Gait assistance: Modified independent (Device/Increase time) Gait Distance (Feet): 160 Feet Assistive device: None Gait Pattern/deviations:  WFL(Within Functional Limits);Step-through pattern Gait velocity: decreased   General Gait Details: no significant gait deviations but does have proximal muscle weakness  Stairs Stairs: Yes Stairs assistance: Supervision Stair Management: One rail Left;Alternating pattern;Forwards Number of Stairs: 4 General stair comments: slow but steady, no significant gait or mobiity deficit noted on stairs  Wheelchair Mobility    Modified Rankin (Stroke Patients Only)       Balance Overall balance assessment: No apparent balance deficits (not formally assessed)                                           Pertinent Vitals/Pain Pain Assessment: No/denies pain    Home Living Family/patient expects to be discharged to:: Private residence Living Arrangements: Spouse/significant other Available Help at Discharge: Family;Available 24 hours/day Type of Home: House Home Access: Stairs to enter   Entrance Stairs-Number of Steps: 2 Home Layout: One level Home Equipment: Shower seat - built in;Hand held shower head;Grab bars - tub/shower;Grab bars - toilet Additional Comments: 3 children live within 2 miles from pt's home and can assist as needed    Prior Function Level of Independence: Independent               Hand Dominance   Dominant Hand: Right    Extremity/Trunk Assessment   Upper Extremity Assessment Upper Extremity Assessment: Defer to OT evaluation    Lower Extremity Assessment Lower Extremity Assessment: Generalized weakness    Cervical / Trunk Assessment Cervical / Trunk Assessment: Normal  Communication   Communication: No difficulties  Cognition Arousal/Alertness: Awake/alert Behavior During Therapy: WFL for   tasks assessed/performed Overall Cognitive Status: Within Functional Limits for tasks assessed                                 General Comments: Good safety awareness, very pleasant and cooperative      General Comments  General comments (skin integrity, edema, etc.): mild dizziness with positional changes but improved/manageable with rest/pausing after transitions    Exercises     Assessment/Plan    PT Assessment All further PT needs can be met in the next venue of care  PT Problem List Decreased strength;Decreased activity tolerance;Decreased balance;Decreased mobility;Decreased coordination       PT Treatment Interventions      PT Goals (Current goals can be found in the Care Plan section)  Acute Rehab PT Goals Patient Stated Goal: return home PT Goal Formulation: With patient Time For Goal Achievement: 04/12/21 Potential to Achieve Goals: Good    Frequency     Barriers to discharge        Co-evaluation               AM-PAC PT "6 Clicks" Mobility  Outcome Measure Help needed turning from your back to your side while in a flat bed without using bedrails?: None Help needed moving from lying on your back to sitting on the side of a flat bed without using bedrails?: None Help needed moving to and from a bed to a chair (including a wheelchair)?: None Help needed standing up from a chair using your arms (e.g., wheelchair or bedside chair)?: None Help needed to walk in hospital room?: None Help needed climbing 3-5 steps with a railing? : A Little 6 Click Score: 23    End of Session   Activity Tolerance: Patient tolerated treatment well Patient left: in bed;with call bell/phone within reach Nurse Communication: Mobility status PT Visit Diagnosis: Muscle weakness (generalized) (M62.81);Unsteadiness on feet (R26.81)    Time: 4193-7902 PT Time Calculation (min) (ACUTE ONLY): 13 min   Charges:   PT Evaluation $PT Eval Moderate Complexity: 1 Mod          Windell Norfolk, DPT, PN1   Supplemental Physical Therapist [place]    Pager [phone] Acute Rehab Office [phone]

## 2021-03-29 NOTE — Telephone Encounter (Signed)
Attempted to call patient in regards to Cardiac Rehab - LM on VM 

## 2021-03-29 NOTE — Evaluation (Signed)
Occupational Therapy Evaluation/Discharge Patient Details Name: Kim Brown MRN: [id number] DOB: May 05, 1951 Today's Date: 03/29/2021    History of Present Illness 70 year old female past medical history of atrial flutter, hypertension, dyslipidemia, hypothyroidism, and coronary artery disease status post ST elevation myocardial infarction 03/20/2021, status post PCI angioplasty, LAD, circumflex. Pt was not feeling well at home with MD recommending stop taking metoprolol. Pt did not take beta blocker on 4/8 and resumed lower dose on 4/9. Pt presents to ED after syncopal episode in kitchen. q   Clinical Impression   PTA, pt lives with spouse and reports Independence with ADLs, IADLs and mobility without use of AD. Pt presents now Independent for mobility in room, ADLs standing at sink without use of AD. Pt with good safety awareness, no LOB, no dizziness and reports feeling back at baseline. Pt reports no difficulty with ADLs/mobility at home leading up to this admission. Pt reports spouse will be home with pt, can assist with tasks as needed to ensure safety. Anticipate no difficulties managing the 2 steps into pt's home. No further skilled OT services needed at acute level or on DC. OT to sign off.     Follow Up Recommendations  No OT follow up    Equipment Recommendations  None recommended by OT    Recommendations for Other Services       Precautions / Restrictions Precautions Precautions: None Restrictions Weight Bearing Restrictions: No      Mobility Bed Mobility Overal bed mobility: Independent                  Transfers Overall transfer level: Independent Equipment used: None                  Balance Overall balance assessment: No apparent balance deficits (not formally assessed)                                         ADL either performed or assessed with clinical judgement   ADL Overall ADL's : Independent                                        General ADL Comments: able to ambulate in room without AD, perform ADLs standing at sink without LOB or safety concerns. Discussed having husband nearby for initial showering tasks - which pt already planned to do. Discussed monitoring BP, slow transitional movements at home.     Vision Baseline Vision/History: Wears glasses Wears Glasses: At all times Patient Visual Report: No change from baseline Vision Assessment?: No apparent visual deficits     Perception     Praxis      Pertinent Vitals/Pain Pain Assessment: No/denies pain     Hand Dominance Right   Extremity/Trunk Assessment Upper Extremity Assessment Upper Extremity Assessment: Overall WFL for tasks assessed   Lower Extremity Assessment Lower Extremity Assessment: Overall WFL for tasks assessed   Cervical / Trunk Assessment Cervical / Trunk Assessment: Normal   Communication Communication Communication: No difficulties   Cognition Arousal/Alertness: Awake/alert Behavior During Therapy: WFL for tasks assessed/performed Overall Cognitive Status: Within Functional Limits for tasks assessed                                 General  Comments: Good safety awareness   General Comments  Denies dizziness, difficulty ambulating, etc    Exercises     Shoulder Instructions      Home Living Family/patient expects to be discharged to:: Private residence Living Arrangements: Spouse/significant other Available Help at Discharge: Family;Available 24 hours/day Type of Home: House Home Access: Stairs to enter [employer] of Steps: 2   Home Layout: One level     Bathroom Shower/Tub: Occupational psychologist: Handicapped height     Home Equipment: Shower seat - built in;Hand held shower head;Grab bars - tub/shower;Grab bars - toilet   Additional Comments: 3 children live within 2 miles from pt's home and can assist as needed      Prior  Functioning/Environment Level of Independence: Independent                 OT Problem List:        OT Treatment/Interventions:      OT Goals(Current goals can be found in the care plan section) Acute Rehab OT Goals Patient Stated Goal: return home OT Goal Formulation: All assessment and education complete, DC therapy  OT Frequency:     Barriers to D/C:            Co-evaluation              AM-PAC OT "6 Clicks" Daily Activity     Outcome Measure Help from another person eating meals?: None Help from another person taking care of personal grooming?: None Help from another person toileting, which includes using toliet, bedpan, or urinal?: None Help from another person bathing (including washing, rinsing, drying)?: None Help from another person to put on and taking off regular upper body clothing?: None Help from another person to put on and taking off regular lower body clothing?: None 6 Click Score: 24   End of Session Nurse Communication: Mobility status  Activity Tolerance: Patient tolerated treatment well Patient left: in bed;with call bell/phone within reach  OT Visit Diagnosis: Muscle weakness (generalized) (M62.81)                Time: 5670-1410 OT Time Calculation (min): 14 min Charges:  OT General Charges $OT Visit: 1 Visit OT Evaluation $OT Eval Low Complexity: 1 Low  Malachy Chamber, OTR/L Acute Rehab Services Office: [phone]  Layla Maw 03/29/2021, 7:45 AM

## 2021-03-29 NOTE — Progress Notes (Signed)
Progress Note  Patient Name: Kim Brown Date of Encounter: 03/29/2021  Primary Cardiologist: Larae Grooms, MD   Subjective   No events overnight.  Notes some heart flutters that correlate to SR on telemetry.  Notes rare CP.  No SOB.  Inpatient Medications    Scheduled Meds: . aspirin EC  81 mg Oral Daily  . enoxaparin (LOVENOX) injection  40 mg Subcutaneous Q24H  . melatonin  10 mg Oral QHS  . metoprolol tartrate  12.5 mg Oral BID  . progesterone  300 mg Oral QHS  . rosuvastatin  40 mg Oral Daily  . thyroid  60 mg Oral QAC breakfast   And  . thyroid  60 mg Oral QAC supper  . ticagrelor  90 mg Oral BID   Continuous Infusions:  PRN Meds: acetaminophen **OR** acetaminophen, nitroGLYCERIN   Vital Signs    Vitals:   03/28/21 1953 03/28/21 2209 03/28/21 2341 03/29/21 0516  BP: 131/73 116/75 116/73 122/72  Pulse: 79 78 83 88  Resp: 16 18 18 19   Temp: 98.7 F (37.1 C) 97.8 F (36.6 C) 98.3 F (36.8 C) 98.5 F (36.9 C)  TempSrc: Oral Oral Oral Oral  SpO2: 99% 97% 96% 98%  Weight:    70.3 kg    Intake/Output Summary (Last 24 hours) at 03/29/2021 0903 Last data filed at 03/29/2021 0521 Gross per 24 hour  Intake 387.8 ml  Output --  Net 387.8 ml   Filed Weights   03/28/21 0441 03/29/21 0516  Weight: 71 kg 70.3 kg    Telemetry    SR to sinus tachycardia - Personally Reviewed  ECG    No new last 24 - Personally Reviewed  Physical Exam   GEN: No acute distress.   Neck: No JVD Cardiac: RRR, no murmurs, rubs, or gallops.  Respiratory: Clear to auscultation bilaterally. GI: Soft, nontender, non-distended  MS: No edema; No deformity. Neuro:  Nonfocal  Psych: Normal affect   Labs    Chemistry Recent Labs  Lab 03/27/21 2021  NA 140  K 4.0  CL 111  CO2 20*  GLUCOSE 109*  BUN 12  CREATININE 0.92  CALCIUM 9.1  PROT 6.6  ALBUMIN 3.7  AST 21  ALT 18  ALKPHOS 67  BILITOT 0.7  GFRNONAA >60  ANIONGAP 9     Hematology Recent Labs  Lab  03/27/21 2021  WBC 10.9*  RBC 5.03  HGB 15.3*  HCT 46.1*  MCV 91.7  MCH 30.4  MCHC 33.2  RDW 13.1  PLT 301    Cardiac EnzymesNo results for input(s): TROPONINI in the last 168 hours. No results for input(s): TROPIPOC in the last 168 hours.   BNP Recent Labs  Lab 03/27/21 2021  BNP 161.8*     DDimer No results for input(s): DDIMER in the last 168 hours.   Radiology    DG Chest Port 1 View  Result Date: 03/27/2021 CLINICAL DATA:  Weakness EXAM: PORTABLE CHEST 1 VIEW COMPARISON:  03/20/2021 FINDINGS: The heart size and mediastinal contours are within normal limits. Both lungs are clear. Partially visualized right shoulder replacement. IMPRESSION: No active disease. Electronically Signed   By: Donavan Foil M.D.   On: 03/27/2021 20:15    Cardiac Studies    Transthoracic Echocardiogram: Date: 03/20/21 Results: 1. Left ventricular ejection fraction, by estimation, is 40 to 45%. The  left ventricle has mildly decreased function. The left ventricle has no  regional wall motion abnormalities. There is mild left ventricular  hypertrophy. Left  ventricular diastolic  parameters are consistent with Grade I diastolic dysfunction (impaired  relaxation).  2. Right ventricular systolic function is normal. The right ventricular  size is normal. There is mildly elevated pulmonary artery systolic  pressure.  3. The mitral valve is normal in structure. No evidence of mitral valve  regurgitation. No evidence of mitral stenosis.  4. The aortic valve is normal in structure. Aortic valve regurgitation is  not visualized. No aortic stenosis is present.  5. The inferior vena cava is dilated in size with <50% respiratory  variability, suggesting right atrial pressure of 15 mmHg.    Left/Right Heart Catheterizations: Date: 03/20/21 Results:  Mid LAD-1 lesion is 100% stenosed. After thrombectomy, a drug-eluting stent was successfully placed using a SYNERGY XD 3.0X16, postdilated to 3.75  mm.  Post intervention, there is a 0% residual stenosis.  Mid LAD-2 lesion is 95% stenosed. A drug-eluting stent was successfully placed using a SYNERGY XD 2.25X28, postdilated to 2.5 mm.  Post intervention, there is a 0% residual stenosis.  Mid Cx lesion is 90% stenosed. A drug-eluting stent was successfully placed using a SYNERGY XD 3.50X16, postdilated to 3.75 mm.  Post intervention, there is a 0% residual stenosis.  2nd Diag lesion is 50% stenosed.  Dist LAD lesion is 25% stenosed. The mid to distal LAD was a very small vessel in general.  There is mild to moderate left ventricular systolic dysfunction.  LV end diastolic pressure is moderately elevated. LVEDP 23 mm Hg.  The left ventricular ejection fraction is 35-45% by visual estimate.  There is no aortic valve stenosis.   Continue dual antiplatelet therapy for 12 months.  She will need aggressive secondary prevention.  She had myalgias with lovastatin.  Start rosuvastatin 20 mg daily.  She will need metoprolol and likely an ACE inhibitor depending on her ejection fraction.  We will plan for echocardiogram on Monday.  No IV fluids due to elevated LVEDP in the setting of anterior wall MI and decreased LVEF.    Patient Profile     70 y.o. female a history of recent NSTEMI and subsequent ischemic cardiymopathy who presented with near syncope  Assessment & Plan    NSTEMI within 28 days Near syncope (resolved) Cad s/p PCI with residual mil disease Ischemic cardiomyopathy HLD - PT to see but strength is improving - continue DAPT  - tolerating high dose rosuvastation - continue metoprolol 12.5 mg PO BID - continue PRN Nitro; if recurrent use from DC to Thursday f/u may be Ranexa candidate - at this time unsure if patient would tolerate ACEi/ARB; consider oupatient titration  Discussed with patient and primary MD   For questions or updates, please contact [facility] Please consult [email or web address] for contact info  under Cardiology/STEMI.      Signed, Werner Lean, MD  03/29/2021, 9:03 AM

## 2021-03-29 NOTE — Discharge Summary (Addendum)
Physician Discharge Summary  Kim Brown BJS:[id number] DOB: May 05, 1951 DOA: 03/27/2021  PCP: Tonia Ghent, MD  Admit date: 03/27/2021 Discharge date: 03/29/2021  Admitted From: Home  Disposition:  Home   Recommendations for Outpatient Follow-up and new medication changes:  1. Follow up with Dr. Damita Dunnings in 7 days.  2. Low TSH, thyroid decreased to 60 mg po bid. 3. Resumed on low dose metoprolol.   Home Health: yes   Equipment/Devices: na    Discharge Condition: stable  CODE STATUS: full Diet recommendation: heart healthy   Brief/Interim Summary: Kim Brown was admitted to the hospital with the working diagnosis of near syncope.  70 year old female past medical history of atrial flutter, hypertension, dyslipidemia, hypothyroidism, and coronary artery disease status post ST elevation myocardial infarction 03/20/2021, status post PCI angioplasty, LAD, circumflex.  After her discharge she felt generalized weakness and difficulty ambulating.  As an outpatient she was advised to discontinue her beta-blockade.  At home she continued to have near syncope episodes that prompted her to come to the hospital.  On her initial physical examination temperature 98, blood pressure 128/77, heart rate 90, respiratory rate 15, oxygenation 100%, lungs clear to auscultation bilaterally, heart S1-S2, present rhythmic, soft abdomen, no lower extremity edema. Had urinary retention 1600 cc in the emergency department.  Sodium 140, potassium 4.0, chloride 111, bicarb 20, glucose 109, BUN 12, creatinine 0.92, BNP 161, troponin I 69-75, white count 10.9, hemoglobin 15.3, hematocrit 46.1, platelets 301. SARS COVID-19 negative. Urine analysis with specific gravity 1.003.  Chest radiograph no infiltrates. EKG 89 bpm, left axis deviation, left anterior fascicular block, sinus rhythm, Q-wave lead V1-V2, no ST segment or T wave changes.  Orthostatic vital signs lying: 122/76, heart rate 82, sitting: 130/81, heart  rate 94, standing: 128/87 heart rate 92.  TSH 0.150.   Patient was placed on a telemetry monitor and resumed on low dose metoprolol with good toleration.   1.  Near syncope episode.  Likely multifactorial, including vasovagal. Patient received intravenous fluids, placed on remote telemetry monitor. Noted patient had urinary retention 1600 cc on initial evaluation in the emergency department.  Patient was resumed on low-dose metoprolol with good toleration.  PT has suggested possibility of vertebral artery impingement, with some reproducible symptoms with head extension and rotation.   2.  Coronary artery disease status post recent angioplasty LAD and circumflex. Ischemic cardiomyopathy, ejection fraction 60-73% chronic systolic heart failure. She was continued on dual antiplatelet therapy with aspirin and ticagrelor. Continue beta-blockade with metoprolol, avoiding ACE or ARB due to risk of hypotension.  No signs of heart failure exacerbation.  3.  Transient urinary retention.  Initially required a Foley catheter, 1600 cc of urine retention in the ED. Foley catheter removed and patient voiding appropriately.  4.  Hypothyroidism.  Noted low TSH, dose of thyroid replacement therapy was decreased to 60 mg twice daily  Follow-up thyroid function test in 2 to 3 weeks.  5.  Dyslipidemia.  Continue statin therapy.  6.  Anxiety.  Remained stable during her hospitalization.  Discharge Diagnoses:  Active Problems:   Hyperlipidemia   Lower urinary tract symptoms (LUTS)   Hypothyroidism   Near syncope   ASVD (arteriosclerotic vascular disease)    Discharge Instructions   Allergies as of 03/29/2021      Reactions   Albuterol    Jittery sensation   Lovastatin    Myalgias      Medication List    TAKE these medications   Aspirin Low Dose 81  MG EC tablet Generic drug: aspirin Take 1 tablet (81 mg total) by mouth daily.   b complex vitamins tablet Take 1 tablet by mouth 3  (three) times a week.   beta carotene 10000 UNIT capsule Take 10,000 Units by mouth daily.   Brilinta 90 MG Tabs tablet Generic drug: ticagrelor Take 1 tablet (90 mg total) by mouth 2 (two) times daily.   CALTRATE 600+D PO Take 1 tablet by mouth daily.   DHEA 25 MG Caps Take 25 mg by mouth daily.   fish oil-omega-3 fatty acids 1000 MG capsule Take 1 g by mouth daily.   Magnesium 250 MG Tabs Take 250 mg by mouth daily.   Melatonin 10 MG Tabs Take 10 mg by mouth at bedtime.   metoprolol tartrate 25 MG tablet Commonly known as: LOPRESSOR Take 0.5 tablets (12.5 mg total) by mouth 2 (two) times daily. What changed: how much to take   nitroGLYCERIN 0.4 MG SL tablet Commonly known as: Nitrostat Place 1 tablet (0.4 mg total) under the tongue every 5 (five) minutes as needed for chest pain.   phenylephrine 10 MG Tabs tablet Commonly known as: SUDAFED PE Take 10 mg by mouth every 4 (four) hours as needed (sinus).   progesterone 100 MG capsule Commonly known as: PROMETRIUM Take 300 mg by mouth at bedtime.   rosuvastatin 40 MG tablet Commonly known as: CRESTOR Take 1 tablet (40 mg total) by mouth daily.   Systane 0.4-0.3 % Soln Generic drug: Polyethyl Glycol-Propyl Glycol Place 1 drop into both eyes 2 (two) times daily.   thyroid 60 MG tablet Commonly known as: ARMOUR Take one tablet in am and one tablet in pm. What changed:   how much to take  how to take this  when to take this  additional instructions   vitamin C 1000 MG tablet Take 1,000 mg by mouth 2 (two) times daily.   Vitamin D 50 MCG (2000 UT) Caps Take 1 capsule by mouth 2 (two) times daily.   zinc gluconate 50 MG tablet Take 50 mg by mouth daily.       Allergies  Allergen Reactions  . Albuterol     Jittery sensation  . Lovastatin     Myalgias    Consultations:  Cardiology    Procedures/Studies: CARDIAC CATHETERIZATION  Result Date: 03/20/2021  Mid LAD-1 lesion is 100% stenosed.  After thrombectomy, a drug-eluting stent was successfully placed using a SYNERGY XD 3.0X16, postdilated to 3.75 mm.  Post intervention, there is a 0% residual stenosis.  Mid LAD-2 lesion is 95% stenosed. A drug-eluting stent was successfully placed using a SYNERGY XD 2.25X28, postdilated to 2.5 mm.  Post intervention, there is a 0% residual stenosis.  Mid Cx lesion is 90% stenosed. A drug-eluting stent was successfully placed using a SYNERGY XD 3.50X16, postdilated to 3.75 mm.  Post intervention, there is a 0% residual stenosis.  2nd Diag lesion is 50% stenosed.  Dist LAD lesion is 25% stenosed. The mid to distal LAD was a very small vessel in general.  There is mild to moderate left ventricular systolic dysfunction.  LV end diastolic pressure is moderately elevated. LVEDP 23 mm Hg.  The left ventricular ejection fraction is 35-45% by visual estimate.  There is no aortic valve stenosis.  Continue dual antiplatelet therapy for 12 months.  She will need aggressive secondary prevention.  She had myalgias with lovastatin.  Start rosuvastatin 20 mg daily.  She will need metoprolol and likely an ACE inhibitor depending  on her ejection fraction.  We will plan for echocardiogram on Monday.  No IV fluids due to elevated LVEDP in the setting of anterior wall MI and decreased LVEF. I discussed the findings with her husband.   DG Chest Port 1 View  Result Date: 03/27/2021 CLINICAL DATA:  Weakness EXAM: PORTABLE CHEST 1 VIEW COMPARISON:  03/20/2021 FINDINGS: The heart size and mediastinal contours are within normal limits. Both lungs are clear. Partially visualized right shoulder replacement. IMPRESSION: No active disease. Electronically Signed   By: Donavan Foil M.D.   On: 03/27/2021 20:15   DG Chest Port 1 View  Result Date: 03/20/2021 CLINICAL DATA:  Chest pain EXAM: PORTABLE CHEST 1 VIEW COMPARISON:  07/12/2020 FINDINGS: Heart and mediastinal contours are within normal limits. No focal opacities or  effusions. No acute bony abnormality. IMPRESSION: No active disease. Electronically Signed   By: Rolm Baptise M.D.   On: 03/20/2021 02:43   ECHOCARDIOGRAM COMPLETE  Result Date: 03/20/2021    ECHOCARDIOGRAM REPORT   Patient Name:   TAFFIE ECKMANN Date of Exam: 03/20/2021 Medical Rec #:  [id number]     Height:       64.0 in Accession #:    [id number]    Weight:       166.7 lb Date of Birth:  04-03-1951      BSA:          1.811 m Patient Age:    42 years      BP:           104/67 mmHg Patient Gender: F             HR:           97 bpm. Exam Location:  Inpatient Procedure: 2D Echo, Cardiac Doppler and Color Doppler Indications:    Acute myocardial infarction  History:        Patient has no prior history of Echocardiogram examinations.                 Risk Factors:Hypertension and Dyslipidemia.  Sonographer:    Clayton Lefort RDCS (AE) Referring Phys: [id number] MARK C SKAINS IMPRESSIONS  1. Left ventricular ejection fraction, by estimation, is 40 to 45%. The left ventricle has mildly decreased function. The left ventricle has no regional wall motion abnormalities. There is mild left ventricular hypertrophy. Left ventricular diastolic parameters are consistent with Grade I diastolic dysfunction (impaired relaxation).  2. Right ventricular systolic function is normal. The right ventricular size is normal. There is mildly elevated pulmonary artery systolic pressure.  3. The mitral valve is normal in structure. No evidence of mitral valve regurgitation. No evidence of mitral stenosis.  4. The aortic valve is normal in structure. Aortic valve regurgitation is not visualized. No aortic stenosis is present.  5. The inferior vena cava is dilated in size with <50% respiratory variability, suggesting right atrial pressure of 15 mmHg. FINDINGS  Left Ventricle: Left ventricular ejection fraction, by estimation, is 40 to 45%. The left ventricle has mildly decreased function. The left ventricle has no regional wall motion abnormalities.  Definity contrast agent was given IV to delineate the left ventricular endocardial borders. The left ventricular internal cavity size was normal in size. There is mild left ventricular hypertrophy. Left ventricular diastolic parameters are consistent with Grade I diastolic dysfunction (impaired relaxation).  LV Wall Scoring: The apical inferior segment and apex are akinetic. The entire anterior septum and mid inferoseptal segment are hypokinetic. Right Ventricle: The right ventricular size is normal.  No increase in right ventricular wall thickness. Right ventricular systolic function is normal. There is mildly elevated pulmonary artery systolic pressure. The tricuspid regurgitant velocity is 2.45  m/s, and with an assumed right atrial pressure of 15 mmHg, the estimated right ventricular systolic pressure is 58.0 mmHg. Left Atrium: Left atrial size was normal in size. Right Atrium: Right atrial size was normal in size. Pericardium: There is no evidence of pericardial effusion. Mitral Valve: The mitral valve is normal in structure. No evidence of mitral valve regurgitation. No evidence of mitral valve stenosis. MV peak gradient, 6.8 mmHg. The mean mitral valve gradient is 2.0 mmHg. Tricuspid Valve: The tricuspid valve is normal in structure. Tricuspid valve regurgitation is trivial. No evidence of tricuspid stenosis. Aortic Valve: The aortic valve is normal in structure. Aortic valve regurgitation is not visualized. No aortic stenosis is present. Aortic valve mean gradient measures 4.0 mmHg. Aortic valve peak gradient measures 6.8 mmHg. Aortic valve area, by VTI measures 2.46 cm. Pulmonic Valve: The pulmonic valve was normal in structure. Pulmonic valve regurgitation is not visualized. No evidence of pulmonic stenosis. Aorta: The aortic root is normal in size and structure. Venous: The inferior vena cava is dilated in size with less than 50% respiratory variability, suggesting right atrial pressure of 15 mmHg.  IAS/Shunts: No atrial level shunt detected by color flow Doppler.  LEFT VENTRICLE PLAX 2D LVIDd:         4.20 cm      Diastology LVIDs:         3.20 cm      LV e' medial:    5.87 cm/s LV PW:         1.20 cm      LV E/e' medial:  12.5 LV IVS:        1.10 cm      LV e' lateral:   6.85 cm/s LVOT diam:     1.85 cm      LV E/e' lateral: 10.7 LV SV:         61 LV SV Index:   34 LVOT Area:     2.69 cm  LV Volumes (MOD) LV vol d, MOD A2C: 105.0 ml LV vol d, MOD A4C: 78.6 ml LV vol s, MOD A2C: 60.7 ml LV vol s, MOD A4C: 38.9 ml LV SV MOD A2C:     44.3 ml LV SV MOD A4C:     78.6 ml LV SV MOD BP:      44.0 ml RIGHT VENTRICLE             IVC RV Basal diam:  2.60 cm     IVC diam: 2.20 cm RV S prime:     17.60 cm/s TAPSE (M-mode): 2.2 cm LEFT ATRIUM             Index       RIGHT ATRIUM           Index LA diam:        2.80 cm 1.55 cm/m  RA Area:     11.40 cm LA Vol (A2C):   30.3 ml 16.74 ml/m RA Volume:   26.20 ml  14.47 ml/m LA Vol (A4C):   24.8 ml 13.70 ml/m LA Biplane Vol: 28.1 ml 15.52 ml/m  AORTIC VALVE AV Area (Vmax):    2.54 cm AV Area (Vmean):   1.99 cm AV Area (VTI):     2.46 cm AV Vmax:           130.00  cm/s AV Vmean:          93.200 cm/s AV VTI:            0.249 m AV Peak Grad:      6.8 mmHg AV Mean Grad:      4.0 mmHg LVOT Vmax:         123.00 cm/s LVOT Vmean:        69.100 cm/s LVOT VTI:          0.228 m LVOT/AV VTI ratio: 0.92  AORTA Ao Root diam: 3.10 cm Ao Asc diam:  3.30 cm MITRAL VALVE                TRICUSPID VALVE MV Area (PHT): 3.37 cm     TR Peak grad:   24.0 mmHg MV Area VTI:   2.68 cm     TR Vmax:        245.00 cm/s MV Peak grad:  6.8 mmHg MV Mean grad:  2.0 mmHg     SHUNTS MV Vmax:       1.30 m/s     Systemic VTI:  0.23 m MV Vmean:      65.1 cm/s    Systemic Diam: 1.85 cm MV Decel Time: 225 msec MV E velocity: 73.10 cm/s MV A velocity: 118.00 cm/s MV E/A ratio:  0.62 Candee Furbish MD Electronically signed by Candee Furbish MD Signature Date/Time: 03/20/2021/4:27:59 PM    Final          Subjective: Patient is feeling better, no nausea or vomiting no chest pain.   Discharge Exam: Vitals:   03/28/21 2341 03/29/21 0516  BP: 116/73 122/72  Pulse: 83 88  Resp: 18 19  Temp: 98.3 F (36.8 C) 98.5 F (36.9 C)  SpO2: 96% 98%   Vitals:   03/28/21 1953 03/28/21 2209 03/28/21 2341 03/29/21 0516  BP: 131/73 116/75 116/73 122/72  Pulse: 79 78 83 88  Resp: 16 18 18 19   Temp: 98.7 F (37.1 C) 97.8 F (36.6 C) 98.3 F (36.8 C) 98.5 F (36.9 C)  TempSrc: Oral Oral Oral Oral  SpO2: 99% 97% 96% 98%  Weight:    70.3 kg    General: Not in pain or dyspnea Neurology: Awake and alert, non focal  E ENT: no pallor, no icterus, oral mucosa moist Cardiovascular: No JVD. S1-S2 present, rhythmic, no gallops, rubs, or murmurs. No lower extremity edema. Pulmonary: positive breath sounds bilaterally, adequate air movement, no wheezing, rhonchi or rales. Gastrointestinal. Abdomen soft and non tender Skin. No rashes Musculoskeletal: no joint deformities   The results of significant diagnostics from this hospitalization (including imaging, microbiology, ancillary and laboratory) are listed below for reference.     Microbiology: Recent Results (from the past 240 hour(s))  Resp Panel by RT-PCR (Flu A&B, Covid) Nasopharyngeal Swab     Status: None   Collection Time: 03/20/21  2:19 AM   Specimen: Nasopharyngeal Swab; Nasopharyngeal(NP) swabs in vial transport medium  Result Value Ref Range Status   SARS Coronavirus 2 by RT PCR NEGATIVE NEGATIVE Final    Comment: (NOTE) SARS-CoV-2 target nucleic acids are NOT DETECTED.  The SARS-CoV-2 RNA is generally detectable in upper respiratory specimens during the acute phase of infection. The lowest concentration of SARS-CoV-2 viral copies this assay can detect is 138 copies/mL. A negative result does not preclude SARS-Cov-2 infection and should not be used as the sole basis for treatment or other patient management decisions. A  negative result may occur with  improper specimen collection/handling, submission of specimen other than nasopharyngeal swab, presence of viral mutation(s) within the areas targeted by this assay, and inadequate number of viral copies(<138 copies/mL). A negative result must be combined with clinical observations, patient history, and epidemiological information. The expected result is Negative.  Fact Sheet for Patients:  EntrepreneurPulse.com.au  Fact Sheet for Healthcare Providers:  IncredibleEmployment.be  This test is no t yet approved or cleared by the [place] FDA and  has been authorized for detection and/or diagnosis of SARS-CoV-2 by FDA under an Emergency Use Authorization (EUA). This EUA will remain  in effect (meaning this test can be used) for the duration of the COVID-19 declaration under Section 564(b)(1) of the Act, 21 U.S.C.section 360bbb-3(b)(1), unless the authorization is terminated  or revoked sooner.       Influenza A by PCR NEGATIVE NEGATIVE Final   Influenza B by PCR NEGATIVE NEGATIVE Final    Comment: (NOTE) The Xpert Xpress SARS-CoV-2/FLU/RSV plus assay is intended as an aid in the diagnosis of influenza from Nasopharyngeal swab specimens and should not be used as a sole basis for treatment. Nasal washings and aspirates are unacceptable for Xpert Xpress SARS-CoV-2/FLU/RSV testing.  Fact Sheet for Patients: EntrepreneurPulse.com.au  Fact Sheet for Healthcare Providers: IncredibleEmployment.be  This test is not yet approved or cleared by the [place] FDA and has been authorized for detection and/or diagnosis of SARS-CoV-2 by FDA under an Emergency Use Authorization (EUA). This EUA will remain in effect (meaning this test can be used) for the duration of the COVID-19 declaration under Section 564(b)(1) of the Act, 21 U.S.C. section 360bbb-3(b)(1), unless the authorization  is terminated or revoked.  Performed at [facility], [place] [address], [place], [place] [address]   MRSA PCR Screening     Status: None   Collection Time: 03/20/21  4:26 AM   Specimen: Nasal Mucosa; Nasopharyngeal  Result Value Ref Range Status   MRSA by PCR NEGATIVE NEGATIVE Final    Comment:        The GeneXpert MRSA Assay (FDA approved for NASAL specimens only), is one component of a comprehensive MRSA colonization surveillance program. It is not intended to diagnose MRSA infection nor to guide or monitor treatment for MRSA infections. Performed at [facility], [place] [address], [place], [place] [address]   Resp Panel by RT-PCR (Flu A&B, Covid) Nasopharyngeal Swab     Status: None   Collection Time: 03/27/21  8:21 PM   Specimen: Nasopharyngeal Swab; Nasopharyngeal(NP) swabs in vial transport medium  Result Value Ref Range Status   SARS Coronavirus 2 by RT PCR NEGATIVE NEGATIVE Final    Comment: (NOTE) SARS-CoV-2 target nucleic acids are NOT DETECTED.  The SARS-CoV-2 RNA is generally detectable in upper respiratory specimens during the acute phase of infection. The lowest concentration of SARS-CoV-2 viral copies this assay can detect is 138 copies/mL. A negative result does not preclude SARS-Cov-2 infection and should not be used as the sole basis for treatment or other patient management decisions. A negative result may occur with  improper specimen collection/handling, submission of specimen other than nasopharyngeal swab, presence of viral mutation(s) within the areas targeted by this assay, and inadequate number of viral copies(<138 copies/mL). A negative result must be combined with clinical observations, patient history, and epidemiological information. The expected result is Negative.  Fact Sheet for Patients:  EntrepreneurPulse.com.au  Fact Sheet for Healthcare Providers:  IncredibleEmployment.be  This test is no  t yet approved or cleared by  the [employer] and  has been authorized for detection and/or diagnosis of SARS-CoV-2 by FDA under an Emergency Use Authorization (EUA). This EUA will remain  in effect (meaning this test can be used) for the duration of the COVID-19 declaration under Section 564(b)(1) of the Act, 21 U.S.C.section 360bbb-3(b)(1), unless the authorization is terminated  or revoked sooner.       Influenza A by PCR NEGATIVE NEGATIVE Final   Influenza B by PCR NEGATIVE NEGATIVE Final    Comment: (NOTE) The Xpert Xpress SARS-CoV-2/FLU/RSV plus assay is intended as an aid in the diagnosis of influenza from Nasopharyngeal swab specimens and should not be used as a sole basis for treatment. Nasal washings and aspirates are unacceptable for Xpert Xpress SARS-CoV-2/FLU/RSV testing.  Fact Sheet for Patients: EntrepreneurPulse.com.au  Fact Sheet for Healthcare Providers: IncredibleEmployment.be  This test is not yet approved or cleared by the [place] FDA and has been authorized for detection and/or diagnosis of SARS-CoV-2 by FDA under an Emergency Use Authorization (EUA). This EUA will remain in effect (meaning this test can be used) for the duration of the COVID-19 declaration under Section 564(b)(1) of the Act, 21 U.S.C. section 360bbb-3(b)(1), unless the authorization is terminated or revoked.  Performed at [facility], [place] [address], [place], [place] [address]      Labs: BNP (last 3 results) Recent Labs    03/27/21 2021  BNP 027.2*   Basic Metabolic Panel: Recent Labs  Lab 03/27/21 2021  NA 140  K 4.0  CL 111  CO2 20*  GLUCOSE 109*  BUN 12  CREATININE 0.92  CALCIUM 9.1   Liver Function Tests: Recent Labs  Lab 03/27/21 2021  AST 21  ALT 18  ALKPHOS 67  BILITOT 0.7  PROT 6.6  ALBUMIN 3.7   No results for input(s): LIPASE, AMYLASE in the last 168 hours. No results for input(s): AMMONIA in the  last 168 hours. CBC: Recent Labs  Lab 03/27/21 2021  WBC 10.9*  NEUTROABS 8.6*  HGB 15.3*  HCT 46.1*  MCV 91.7  PLT 301   Cardiac Enzymes: No results for input(s): CKTOTAL, CKMB, CKMBINDEX, TROPONINI in the last 168 hours. BNP: Invalid input(s): POCBNP CBG: Recent Labs  Lab 03/28/21 0610 03/29/21 0533  GLUCAP 101* 96   D-Dimer No results for input(s): DDIMER in the last 72 hours. Hgb A1c No results for input(s): HGBA1C in the last 72 hours. Lipid Profile No results for input(s): CHOL, HDL, LDLCALC, TRIG, CHOLHDL, LDLDIRECT in the last 72 hours. Thyroid function studies Recent Labs    03/28/21 0344  TSH 0.150*   Anemia work up No results for input(s): VITAMINB12, FOLATE, FERRITIN, TIBC, IRON, RETICCTPCT in the last 72 hours. Urinalysis    Component Value Date/Time   COLORURINE STRAW (A) 03/27/2021 2143   APPEARANCEUR CLEAR 03/27/2021 2143   LABSPEC 1.003 (L) 03/27/2021 2143   PHURINE 8.0 03/27/2021 2143   GLUCOSEU NEGATIVE 03/27/2021 2143   HGBUR NEGATIVE 03/27/2021 2143   BILIRUBINUR NEGATIVE 03/27/2021 2143   BILIRUBINUR Neg 09/15/2019 1256   KETONESUR 5 (A) 03/27/2021 2143   PROTEINUR NEGATIVE 03/27/2021 2143   UROBILINOGEN 0.2 09/15/2019 1256   UROBILINOGEN 0.2 06/11/2013 1640   NITRITE NEGATIVE 03/27/2021 2143   LEUKOCYTESUR NEGATIVE 03/27/2021 2143   Sepsis Labs Invalid input(s): PROCALCITONIN,  WBC,  LACTICIDVEN Microbiology Recent Results (from the past 240 hour(s))  Resp Panel by RT-PCR (Flu A&B, Covid) Nasopharyngeal Swab     Status: None   Collection Time:  03/20/21  2:19 AM   Specimen: Nasopharyngeal Swab; Nasopharyngeal(NP) swabs in vial transport medium  Result Value Ref Range Status   SARS Coronavirus 2 by RT PCR NEGATIVE NEGATIVE Final    Comment: (NOTE) SARS-CoV-2 target nucleic acids are NOT DETECTED.  The SARS-CoV-2 RNA is generally detectable in upper respiratory specimens during the acute phase of infection. The  lowest concentration of SARS-CoV-2 viral copies this assay can detect is 138 copies/mL. A negative result does not preclude SARS-Cov-2 infection and should not be used as the sole basis for treatment or other patient management decisions. A negative result may occur with  improper specimen collection/handling, submission of specimen other than nasopharyngeal swab, presence of viral mutation(s) within the areas targeted by this assay, and inadequate number of viral copies(<138 copies/mL). A negative result must be combined with clinical observations, patient history, and epidemiological information. The expected result is Negative.  Fact Sheet for Patients:  EntrepreneurPulse.com.au  Fact Sheet for Healthcare Providers:  IncredibleEmployment.be  This test is no t yet approved or cleared by the [place] FDA and  has been authorized for detection and/or diagnosis of SARS-CoV-2 by FDA under an Emergency Use Authorization (EUA). This EUA will remain  in effect (meaning this test can be used) for the duration of the COVID-19 declaration under Section 564(b)(1) of the Act, 21 U.S.C.section 360bbb-3(b)(1), unless the authorization is terminated  or revoked sooner.       Influenza A by PCR NEGATIVE NEGATIVE Final   Influenza B by PCR NEGATIVE NEGATIVE Final    Comment: (NOTE) The Xpert Xpress SARS-CoV-2/FLU/RSV plus assay is intended as an aid in the diagnosis of influenza from Nasopharyngeal swab specimens and should not be used as a sole basis for treatment. Nasal washings and aspirates are unacceptable for Xpert Xpress SARS-CoV-2/FLU/RSV testing.  Fact Sheet for Patients: EntrepreneurPulse.com.au  Fact Sheet for Healthcare Providers: IncredibleEmployment.be  This test is not yet approved or cleared by the [place] FDA and has been authorized for detection and/or diagnosis of SARS-CoV-2 by FDA under  an Emergency Use Authorization (EUA). This EUA will remain in effect (meaning this test can be used) for the duration of the COVID-19 declaration under Section 564(b)(1) of the Act, 21 U.S.C. section 360bbb-3(b)(1), unless the authorization is terminated or revoked.  Performed at [facility], [place] [address], [place], [place] [address]   MRSA PCR Screening     Status: None   Collection Time: 03/20/21  4:26 AM   Specimen: Nasal Mucosa; Nasopharyngeal  Result Value Ref Range Status   MRSA by PCR NEGATIVE NEGATIVE Final    Comment:        The GeneXpert MRSA Assay (FDA approved for NASAL specimens only), is one component of a comprehensive MRSA colonization surveillance program. It is not intended to diagnose MRSA infection nor to guide or monitor treatment for MRSA infections. Performed at [facility], [place] [address] [place],  [address]   Resp Panel by RT-PCR (Flu A&B, Covid) Nasopharyngeal Swab     Status: None   Collection Time: 03/27/21  8:21 PM   Specimen: Nasopharyngeal Swab; Nasopharyngeal(NP) swabs in vial transport medium  Result Value Ref Range Status   SARS Coronavirus 2 by RT PCR NEGATIVE NEGATIVE Final    Comment: (NOTE) SARS-CoV-2 target nucleic acids are NOT DETECTED.  The SARS-CoV-2 RNA is generally detectable in upper respiratory specimens during the acute phase of infection. The lowest concentration of SARS-CoV-2 viral copies this assay can detect is  138 copies/mL. A negative result does not preclude SARS-Cov-2 infection and should not be used as the sole basis for treatment or other patient management decisions. A negative result may occur with  improper specimen collection/handling, submission of specimen other than nasopharyngeal swab, presence of viral mutation(s) within the areas targeted by this assay, and inadequate number of viral copies(<138 copies/mL). A negative result must be combined with clinical observations, patient  history, and epidemiological information. The expected result is Negative.  Fact Sheet for Patients:  EntrepreneurPulse.com.au  Fact Sheet for Healthcare Providers:  IncredibleEmployment.be  This test is no t yet approved or cleared by the [place] FDA and  has been authorized for detection and/or diagnosis of SARS-CoV-2 by FDA under an Emergency Use Authorization (EUA). This EUA will remain  in effect (meaning this test can be used) for the duration of the COVID-19 declaration under Section 564(b)(1) of the Act, 21 U.S.C.section 360bbb-3(b)(1), unless the authorization is terminated  or revoked sooner.       Influenza A by PCR NEGATIVE NEGATIVE Final   Influenza B by PCR NEGATIVE NEGATIVE Final    Comment: (NOTE) The Xpert Xpress SARS-CoV-2/FLU/RSV plus assay is intended as an aid in the diagnosis of influenza from Nasopharyngeal swab specimens and should not be used as a sole basis for treatment. Nasal washings and aspirates are unacceptable for Xpert Xpress SARS-CoV-2/FLU/RSV testing.  Fact Sheet for Patients: EntrepreneurPulse.com.au  Fact Sheet for Healthcare Providers: IncredibleEmployment.be  This test is not yet approved or cleared by the [place] FDA and has been authorized for detection and/or diagnosis of SARS-CoV-2 by FDA under an Emergency Use Authorization (EUA). This EUA will remain in effect (meaning this test can be used) for the duration of the COVID-19 declaration under Section 564(b)(1) of the Act, 21 U.S.C. section 360bbb-3(b)(1), unless the authorization is terminated or revoked.  Performed at [facility], [place] [address] [place], [place] [address]      Time coordinating discharge: 45 minutes  SIGNED:   Tawni Millers, MD  [facility] 03/29/2021, 8:15 AM

## 2021-03-29 NOTE — Progress Notes (Signed)
RN discussed with patient regarding OP therapy. Patient declined at this time and verbalized that she will ask for it if needed. RN notified CM.

## 2021-03-29 NOTE — Telephone Encounter (Signed)
Pt insurance is active and benefits verified through [employer] adv Co-pay $15, DED 0/0 met, out of pocket $3,450/$180 met, co-insurance 0%. no pre-authorization required. Passport, Sam/[employer] 03/29/2021@1 :41pm, REF# [id number]  Will contact patient to see if she is interested in the Cardiac Rehab Program. If interested, patient will need to complete follow up appt. Once completed, patient will be contacted for scheduling upon review by the RN Navigator.

## 2021-03-30 NOTE — Progress Notes (Signed)
Cardiology Office Note   Date:  03/31/2021   ID:  Kim Brown, DOB 1951/02/19, MRN [id number]  PCP:  Tonia Ghent, MD    No chief complaint on file.  CAD  Wt Readings from Last 3 Encounters:  03/31/21 158 lb (71.7 kg)  03/29/21 154 lb 14.4 oz (70.3 kg)  03/22/21 158 lb (71.7 kg)       History of Present Illness: Kim Brown is a 70 y.o. female  With a h/o hyperlipidemia who had an anterior MI in 4/22.  Cath, done a few hours after the [place] game, showed:  "Mid LAD-1 lesion is 100% stenosed. After thrombectomy, a drug-eluting stent was successfully placed using a SYNERGY XD 3.0X16, postdilated to 3.75 mm.  Post intervention, there is a 0% residual stenosis.  Mid LAD-2 lesion is 95% stenosed. A drug-eluting stent was successfully placed using a SYNERGY XD 2.25X28, postdilated to 2.5 mm.  Post intervention, there is a 0% residual stenosis.  Mid Cx lesion is 90% stenosed. A drug-eluting stent was successfully placed using a SYNERGY XD 3.50X16, postdilated to 3.75 mm.  Post intervention, there is a 0% residual stenosis.  2nd Diag lesion is 50% stenosed.  Dist LAD lesion is 25% stenosed. The mid to distal LAD was a very small vessel in general.  There is mild to moderate left ventricular systolic dysfunction.  LV end diastolic pressure is moderately elevated. LVEDP 23 mm Hg.  The left ventricular ejection fraction is 35-45% by visual estimate.  There is no aortic valve stenosis.   Continue dual antiplatelet therapy for 12 months.  She will need aggressive secondary prevention.  She had myalgias with lovastatin.  Start rosuvastatin 20 mg daily.  She will need metoprolol and likely an ACE inhibitor depending on her ejection fraction.  We will plan for echocardiogram on Monday.  No IV fluids due to elevated LVEDP in the setting of anterior wall MI and decreased LVEF."  Admitted with dizziness a week after cath. SHe was given fluid.  She had  urinary retention of 1600 cc.  Coronary artery disease status post recent angioplasty LAD and circumflex. Ischemic cardiomyopathy, ejection fraction 64-68% chronic systolic heart failure. She was continued on dual antiplatelet therapy with aspirin and ticagrelor. Continue beta-blockade with metoprolol, avoiding ACE or ARB due to risk of hypotension.     Past Medical History:  Diagnosis Date  . Allergy   . Anemia   . Arthritis    feet and hands , neck, shoulder  . Dysrhythmia 2006   atrial flutter  . Exogenous obesity   . Heel spur   . Hyperlipidemia   . Hypertension   . Hypothyroid   . Irregular heart beats   . Ischemic cardiomyopathy   . Palpitations   . Prolapsed, anus    uses mag oxide powder nightly   . RSD (reflex sympathetic dystrophy)    after surgery on R foot  . STEMI (ST elevation myocardial infarction) ([place])   . Stress incontinence, female     Past Surgical History:  Procedure Laterality Date  . ABDOMINAL HYSTERECTOMY  2005  . BUNIONECTOMY    . COLONOSCOPY    . CORONARY STENT INTERVENTION N/A 03/20/2021   Procedure: CORONARY STENT INTERVENTION;  Surgeon: Jettie Booze, MD;  Location: [facility];  Service: Cardiovascular;  Laterality: N/A;  . CORONARY/GRAFT ACUTE MI REVASCULARIZATION N/A 03/20/2021   Procedure: Coronary/Graft Acute MI Revascularization;  Surgeon: Jettie Booze, MD;  Location: [facility]  INVASIVE CV LAB;  Service: Cardiovascular;  Laterality: N/A;  . DILATION AND CURETTAGE OF UTERUS    . LEFT HEART CATH AND CORONARY ANGIOGRAPHY N/A 03/20/2021   Procedure: LEFT HEART CATH AND CORONARY ANGIOGRAPHY;  Surgeon: Jettie Booze, MD;  Location: [facility];  Service: Cardiovascular;  Laterality: N/A;  . REVERSE SHOULDER ARTHROPLASTY Right 07/15/2020   Procedure: REVERSE SHOULDER ARTHROPLASTY;  Surgeon: Tania Ade, MD;  Location: [facility];  Service: Orthopedics;  Laterality: Right;     Current Outpatient Medications   Medication Sig Dispense Refill  . Ascorbic Acid (VITAMIN C) 1000 MG tablet Take 1,000 mg by mouth 2 (two) times daily.    Marland Kitchen aspirin 81 MG EC tablet Take 1 tablet (81 mg total) by mouth daily. 30 tablet 11  . b complex vitamins tablet Take 1 tablet by mouth 3 (three) times a week.    . beta carotene 10000 UNIT capsule Take 10,000 Units by mouth daily.    . Calcium Carbonate-Vitamin D (CALTRATE 600+D PO) Take 1 tablet by mouth daily.    . Cholecalciferol (VITAMIN D) 2000 UNITS CAPS Take 1 capsule by mouth 2 (two) times daily.    Marland Kitchen DHEA 25 MG CAPS Take 25 mg by mouth daily.    . fish oil-omega-3 fatty acids 1000 MG capsule Take 1 g by mouth daily.    . Magnesium 250 MG TABS Take 250 mg by mouth daily.    . Melatonin 10 MG TABS Take 10 mg by mouth at bedtime.     . metoprolol tartrate (LOPRESSOR) 25 MG tablet Take 0.5 tablets (12.5 mg total) by mouth 2 (two) times daily. 30 tablet 0  . nitroGLYCERIN (NITROSTAT) 0.4 MG SL tablet Place 1 tablet (0.4 mg total) under the tongue every 5 (five) minutes as needed for chest pain. 25 tablet 3  . phenylephrine (SUDAFED PE) 10 MG TABS tablet Take 10 mg by mouth every 4 (four) hours as needed (sinus).    Vladimir Faster Glycol-Propyl Glycol (SYSTANE) 0.4-0.3 % SOLN Place 1 drop into both eyes 2 (two) times daily.    . progesterone (PROMETRIUM) 100 MG capsule Take 300 mg by mouth at bedtime.     . rosuvastatin (CRESTOR) 40 MG tablet Take 1 tablet (40 mg total) by mouth daily. 30 tablet 6  . thyroid (ARMOUR) 60 MG tablet Take one tablet in am and one tablet in pm. 60 tablet 0  . ticagrelor (BRILINTA) 90 MG TABS tablet Take 1 tablet (90 mg total) by mouth 2 (two) times daily. 60 tablet 11  . zinc gluconate 50 MG tablet Take 50 mg by mouth daily.     No current facility-administered medications for this visit.    Allergies:   Albuterol and Lovastatin    Social History:  The patient  reports that she has never smoked. She has never used smokeless tobacco. She  reports that she does not drink alcohol and does not use drugs.   Family History:  The patient's family history includes Arthritis in her father and mother; Colon polyps in her mother; Diabetes in her brother; Heart disease in her father; Heart failure in her brother; Hypertension in her father and mother; Pancreatic cancer in her brother; Stroke in her father.    ROS:  Please see the history of present illness.   Otherwise, review of systems are positive for fatigue, dizziness.   All other systems are reviewed and negative.    PHYSICAL EXAM: VS:  BP 119/77  Pulse 84   Ht 5\' 4"  (1.626 m)   Wt 158 lb (71.7 kg)   BMI 27.12 kg/m  , BMI Body mass index is 27.12 kg/m. GEN: Well nourished, well developed, in no acute distress  HEENT: normal  Neck: no JVD, carotid bruits, or masses Cardiac: RRR; no murmurs, rubs, or gallops,no edema  Respiratory:  clear to auscultation bilaterally, normal work of breathing GI: soft, nontender, nondistended, + BS MS: no deformity or atrophy  Skin: warm and dry, no rash Neuro:  Strength and sensation are intact Psych: euthymic mood, full affect   EKG:   The ekg ordered 4/10 demonstrates NSR, anterior ST elevation much improved from prior   Recent Labs: 03/20/2021: Magnesium 2.1 03/27/2021: ALT 18; B Natriuretic Peptide 161.8; BUN 12; Creatinine, Ser 0.92; Hemoglobin 15.3; Platelets 301; Potassium 4.0; Sodium 140 03/28/2021: TSH 0.150   Lipid Panel    Component Value Date/Time   CHOL 240 (H) 03/20/2021 0219   TRIG 167 (H) 03/20/2021 0219   HDL 44 03/20/2021 0219   CHOLHDL 5.5 03/20/2021 0219   VLDL 33 03/20/2021 0219   LDLCALC 163 (H) 03/20/2021 0219   LDLDIRECT 184.0 09/05/2019 1120     Other studies Reviewed: Additional studies/ records that were reviewed today with results demonstrating: Hospital records reviewed.   ASSESSMENT AND PLAN:  1.   CAD/ recent anterior MI : She did well in the hospital.  She has had fatigue and dizziness since  hospital discharge.  Per her request, will change metoprolol to a long-acting form.  She recalls having trouble with immediate release blood pressure medicines in the past when she saw Dr. Mare Ferrari.  I encouraged her to stay well-hydrated.  If dizzy feeling does not improve, will change Brilinta to clopidogrel, before she needs the medicine refill in early May.  She will send [place] messages on my chart to see if her symptoms are improving. 2.   Dizziness: Thought to be vasovagal.  Persistent.  Would consider changing Brilinta if no improvement after a month. Will change shortacting metoprolol to Toprol XL 25 .  I stressed the importance of staying well-hydrated and eating regularly to avoid hypoglycemia. 3.   Hyperlipidemia: Now on Crestor. LDL target 70.  4.   Cardiac rehab may be of benefit to her as well to help build up her energy level.   Current medicines are reviewed at length with the patient today.  The patient concerns regarding her medicines were addressed.  The following changes have been made:  No change  Labs/ tests ordered today include:  No orders of the defined types were placed in this encounter.   Recommend 150 minutes/week of aerobic exercise Low fat, low carb, high fiber diet recommended  Disposition:   FU in 3 months   Signed, Larae Grooms, MD  03/31/2021 4:02 PM    [facility] [place], [place], [place]  [address] Phone: [phone]; Fax: [phone]

## 2021-03-31 ENCOUNTER — Encounter: Payer: Self-pay | Admitting: Interventional Cardiology

## 2021-03-31 ENCOUNTER — Ambulatory Visit: Payer: PPO | Admitting: Interventional Cardiology

## 2021-03-31 ENCOUNTER — Other Ambulatory Visit: Payer: Self-pay

## 2021-03-31 VITALS — BP 119/77 | HR 84 | Ht 64.0 in | Wt 158.0 lb

## 2021-03-31 DIAGNOSIS — R42 Dizziness and giddiness: Secondary | ICD-10-CM

## 2021-03-31 DIAGNOSIS — E782 Mixed hyperlipidemia: Secondary | ICD-10-CM | POA: Diagnosis not present

## 2021-03-31 DIAGNOSIS — I25118 Atherosclerotic heart disease of native coronary artery with other forms of angina pectoris: Secondary | ICD-10-CM | POA: Diagnosis not present

## 2021-03-31 MED ORDER — ROSUVASTATIN CALCIUM 40 MG PO TABS: 40.0000 mg | ORAL_TABLET | Freq: Every day | ORAL | 3 refills | Status: DC

## 2021-03-31 MED ORDER — NITROGLYCERIN 0.4 MG SL SUBL: 0.4000 mg | SUBLINGUAL_TABLET | SUBLINGUAL | 3 refills | Status: DC | PRN

## 2021-03-31 MED ORDER — METOPROLOL SUCCINATE ER 25 MG PO TB24: 25.0000 mg | ORAL_TABLET | Freq: Every day | ORAL | 3 refills | Status: DC

## 2021-03-31 MED ORDER — TICAGRELOR 90 MG PO TABS: 90.0000 mg | ORAL_TABLET | Freq: Two times a day (BID) | ORAL | 3 refills | Status: DC

## 2021-03-31 NOTE — Patient Instructions (Signed)
Medication Instructions:  Your physician has recommended you make the following change in your medication: Stop metoprolol tartrate. Start metoprolol succinate 25 mg by mouth daily  *If you need a refill on your cardiac medications before your next appointment, please call your pharmacy*   Lab Work: none If you have labs (blood work) drawn today and your tests are completely normal, you will receive your results only by: Marland Kitchen MyChart Message (if you have MyChart) OR . A paper copy in the mail If you have any lab test that is abnormal or we need to change your treatment, we will call you to review the results.   Testing/Procedures: none   Follow-Up: At [facility], you and your health needs are our priority.  As part of our continuing mission to provide you with exceptional heart care, we have created designated Provider Care Teams.  These Care Teams include your primary Cardiologist (physician) and Advanced Practice Providers (APPs -  Physician Assistants and Nurse Practitioners) who all work together to provide you with the care you need, when you need it.  We recommend signing up for the patient portal called "MyChart".  Sign up information is provided on this After Visit Summary.  MyChart is used to connect with patients for Virtual Visits (Telemedicine).  Patients are able to view lab/test results, encounter notes, upcoming appointments, etc.  Non-urgent messages can be sent to your provider as well.   To learn more about what you can do with MyChart, go to [email or web address].    Your next appointment:   July 21,2022 at 1:20  The format for your next appointment:   In Person  Provider:   Casandra Doffing, MD   Other Instructions  Call or send [place] a message if still feeling fatigued after change in medication

## 2021-04-04 ENCOUNTER — Telehealth: Payer: Self-pay | Admitting: Interventional Cardiology

## 2021-04-04 MED ORDER — CLOPIDOGREL BISULFATE 75 MG PO TABS: 75.0000 mg | ORAL_TABLET | Freq: Every day | ORAL | 3 refills | Status: DC

## 2021-04-04 NOTE — Telephone Encounter (Addendum)
Weakness, shaky, lightheaded, blood pressure 98/64 heart rate 66. Taking Toprol XL 25 mg at night daily, Rosuvastatin 40 mg daily, Brilinta 90 mg two times a day. (newish medications)   From OV 03/31/21: Dr. Irish Lack  2.   Dizziness: Thought to be vasovagal.  Persistent.  Would consider changing Brilinta if no improvement after a month. Will change shortacting metoprolol to Toprol XL 25 .  I stressed the importance of staying well-hydrated and eating regularly to avoid hypoglycemia.  DOD Johnsie Cancel to go ahead and change Brilinta to clopidogrel. Verified with PharmD for switch. Take loading dose of 600 mg of Plavix 24 hours after last dose of Brilinta then take 75 mg daily. Dr. Johnsie Cancel in agreement with dosing. Will forward to Dr. Irish Lack.

## 2021-04-04 NOTE — Telephone Encounter (Signed)
Pt c/o medication issue: 1. Name of Medication: Brilinta  2. How are you currently taking this medication (dosage and times per day)? 2 times a day 3. Are you having a reaction (difficulty breathing--STAT)?  SOB 4. What is your medication issue? Patient feels like her tongue is swollen. Patient has the shakes also.

## 2021-04-05 NOTE — Telephone Encounter (Signed)
SHould be loading dose of 300 mg clopidogrel, not 800 mg.

## 2021-04-05 NOTE — Telephone Encounter (Addendum)
800 mg in note was a typo.   Order sent in  [place]: Take 1 tablet (75 mg total) by mouth daily. Take 600 mg (8 tablets) tonight as a loading dose, then 75 mg daily (1 tablet) - Oral   Sent to pharmacy as: clopidogrel (PLAVIX) 75 MG tablet    Based on pharmacy recommendation.

## 2021-04-09 ENCOUNTER — Telehealth: Payer: Self-pay | Admitting: Student

## 2021-04-09 NOTE — Telephone Encounter (Signed)
   Husband called Answering Service with concerns about side effects of Metoprolol. Called and spoke with husband. He mentions that patient has felt very poorly today. She had profound weakness earlier today to the point where her husband had to help her to the bathroom. She has had shortness of breath and tongue swelling which he states is not new but has been worse today. She also has a new rash on her throat and chest. Other then the rash, husband states none of these symptoms are new but have been coming and going in waves over the past several weeks. When I asked about chest pain, husband states patient states it feels like reflux and patient has been burping a lot. Patient also continues to have dizziness/lightheadedness. She reported dizziness/lightheadedness at last visit with Dr. Irish Lack on 03/31/2021 and plan was to switch from Brilinta to Plavix if dizziness continues. Patient has already been switched to Plavix but symptoms have not improved. Patient/husband are convinced that symptoms are from Metoprolol.   I said it was fine to discontinue Metoprolol but also recommended going to the ED. However, husband stated these symtpoms come in waves and she is started to feel a little better. I again emphasized my recommendation to go to the ED given profound weakness today, increased shortness of breath, tongue swelling, and new rash. Husband did not want to do this right away. Their daughter-in-law is a Therapist, sports and he said they will have her come and evaluate patient first and then if she tells them to go to the ED, she will. Re-emphasized that patient should go to the ED right away if she has any active chest pain, worsening shortness of breath, tongue swelling, or worsening weakness. Husband voiced understanding and agreed.  Asked husband to monitor BP and HR after stopping Metoprolol. Will also send message to scheduler and triage pool to help schedule patient a follow-up visit with Dr. Irish Lack or APP  within a couple of weeks.   Will also route note to Dr. Irish Lack so that he is aware.  Darreld Mclean, PA-C 04/09/2021 5:17 PM

## 2021-04-10 NOTE — Telephone Encounter (Signed)
I agree with stopping the metoprolol.   JV

## 2021-04-11 NOTE — Telephone Encounter (Signed)
Spoke with patient's husband who states she is feeling better today. States her tongue feels "thick" but she has no respiratory distress and she is able to think more clearly. I advised that I will add metoprolol succinate to patient's allergy list. Husband will monitor BP and pulse at home. I gave him BP parameter <130/80 mmHg and HR parameter 60-100 but to let us know if patient is consistently >90 bpm. He is aware that she may have rebound tachycardia over the next few days. Patient has a July appointment with Dr. Irish Lack. There are no sooner appointments than June with APP/Dr. Irish Lack. Husband states he is comfortable with keeping appointment in July and he will call back if patient has worsening symptoms or other concerns. He is aware that we can make 24 or 48 hour acute appointments with other providers if needed. He thanked me for the call.

## 2021-04-17 ENCOUNTER — Emergency Department (HOSPITAL_COMMUNITY)
Admission: EM | Admit: 2021-04-17 | Discharge: 2021-04-17 | Disposition: A | Discharge status: Home / Self Care | Payer: PPO | Attending: Emergency Medicine | Admitting: Emergency Medicine

## 2021-04-17 ENCOUNTER — Other Ambulatory Visit: Payer: Self-pay

## 2021-04-17 ENCOUNTER — Emergency Department (HOSPITAL_COMMUNITY): Payer: PPO

## 2021-04-17 DIAGNOSIS — Z955 Presence of coronary angioplasty implant and graft: Secondary | ICD-10-CM | POA: Insufficient documentation

## 2021-04-17 DIAGNOSIS — Z79899 Other long term (current) drug therapy: Secondary | ICD-10-CM | POA: Diagnosis not present

## 2021-04-17 DIAGNOSIS — Z20822 Contact with and (suspected) exposure to covid-19: Secondary | ICD-10-CM | POA: Diagnosis not present

## 2021-04-17 DIAGNOSIS — Z7982 Long term (current) use of aspirin: Secondary | ICD-10-CM | POA: Insufficient documentation

## 2021-04-17 DIAGNOSIS — I110 Hypertensive heart disease with heart failure: Secondary | ICD-10-CM | POA: Insufficient documentation

## 2021-04-17 DIAGNOSIS — I10 Essential (primary) hypertension: Secondary | ICD-10-CM | POA: Diagnosis not present

## 2021-04-17 DIAGNOSIS — Z96611 Presence of right artificial shoulder joint: Secondary | ICD-10-CM | POA: Insufficient documentation

## 2021-04-17 DIAGNOSIS — R079 Chest pain, unspecified: Secondary | ICD-10-CM | POA: Diagnosis not present

## 2021-04-17 DIAGNOSIS — E039 Hypothyroidism, unspecified: Secondary | ICD-10-CM | POA: Insufficient documentation

## 2021-04-17 DIAGNOSIS — R531 Weakness: Secondary | ICD-10-CM | POA: Diagnosis not present

## 2021-04-17 DIAGNOSIS — I5021 Acute systolic (congestive) heart failure: Secondary | ICD-10-CM | POA: Diagnosis not present

## 2021-04-17 DIAGNOSIS — R0789 Other chest pain: Secondary | ICD-10-CM | POA: Insufficient documentation

## 2021-04-17 DIAGNOSIS — R072 Precordial pain: Secondary | ICD-10-CM

## 2021-04-17 LAB — COMPREHENSIVE METABOLIC PANEL
ALT: 15 U/L (ref 0–44)
AST: 18 U/L (ref 15–41)
Albumin: 3.9 g/dL (ref 3.5–5.0)
Alkaline Phosphatase: 77 U/L (ref 38–126)
Anion gap: 10 (ref 5–15)
BUN: 11 mg/dL (ref 8–23)
CO2: 21 mmol/L — ABNORMAL LOW (ref 22–32)
Calcium: 9.4 mg/dL (ref 8.9–10.3)
Chloride: 109 mmol/L (ref 98–111)
Creatinine, Ser: 0.96 mg/dL (ref 0.44–1.00)
GFR, Estimated: >60 mL/min (ref >60)
Glucose, Bld: 100 mg/dL — ABNORMAL HIGH (ref 70–99)
Potassium: 3.6 mmol/L (ref 3.5–5.1)
Sodium: 140 mmol/L (ref 135–145)
Total Bilirubin: 0.8 mg/dL (ref 0.3–1.2)
Total Protein: 7.1 g/dL (ref 6.5–8.1)

## 2021-04-17 LAB — CBC WITH DIFFERENTIAL/PLATELET
Abs Immature Granulocytes: 0.04 10*3/uL (ref 0.00–0.07)
Basophils Absolute: 0 10*3/uL (ref 0.0–0.1)
Basophils Relative: 0 %
Eosinophils Absolute: 0.1 10*3/uL (ref 0.0–0.5)
Eosinophils Relative: 1 %
HCT: 45.4 % (ref 36.0–46.0)
Hemoglobin: 15.4 g/dL — ABNORMAL HIGH (ref 12.0–15.0)
Immature Granulocytes: 0 %
Lymphocytes Relative: 17 %
Lymphs Abs: 1.6 10*3/uL (ref 0.7–4.0)
MCH: 30.9 pg (ref 26.0–34.0)
MCHC: 33.9 g/dL (ref 30.0–36.0)
MCV: 91 fL (ref 80.0–100.0)
Monocytes Absolute: 0.6 10*3/uL (ref 0.1–1.0)
Monocytes Relative: 6 %
Neutro Abs: 7.4 10*3/uL (ref 1.7–7.7)
Neutrophils Relative %: 76 %
Platelets: 288 10*3/uL (ref 150–400)
RBC: 4.99 MIL/uL (ref 3.87–5.11)
RDW: 13.2 % (ref 11.5–15.5)
WBC: 9.7 10*3/uL (ref 4.0–10.5)
nRBC: 0 % (ref 0.0–0.2)

## 2021-04-17 LAB — LIPID PANEL
Cholesterol: 156 mg/dL (ref 0–200)
HDL: 41 mg/dL (ref >40)
LDL Cholesterol: 89 mg/dL (ref 0–99)
Total CHOL/HDL Ratio: 3.8 RATIO
Triglycerides: 132 mg/dL (ref <150)
VLDL: 26 mg/dL (ref 0–40)

## 2021-04-17 LAB — RESP PANEL BY RT-PCR (FLU A&B, COVID) ARPGX2
Influenza A by PCR: NEGATIVE
Influenza B by PCR: NEGATIVE
SARS Coronavirus 2 by RT PCR: NEGATIVE

## 2021-04-17 LAB — TROPONIN I (HIGH SENSITIVITY)
Troponin I (High Sensitivity): 11 ng/L (ref <18)
Troponin I (High Sensitivity): 13 ng/L (ref <18)

## 2021-04-17 LAB — PROTIME-INR
INR: 1 (ref 0.8–1.2)
Prothrombin Time: 13 seconds (ref 11.4–15.2)

## 2021-04-17 LAB — APTT: aPTT: 31 seconds (ref 24–36)

## 2021-04-17 MED ORDER — ACETAMINOPHEN 500 MG PO TABS
1000.0000 mg | ORAL_TABLET | Freq: Once | ORAL | Status: AC
  Administered 2021-04-17: 1000 mg via ORAL
  Filled 2021-04-17: qty 2

## 2021-04-17 MED ORDER — HEPARIN SODIUM (PORCINE) 5000 UNIT/ML IJ SOLN: 4000.0000 [IU] | Freq: Once | INTRAMUSCULAR | Status: DC

## 2021-04-17 MED ORDER — PANTOPRAZOLE SODIUM 40 MG PO TBEC: 40.0000 mg | DELAYED_RELEASE_TABLET | Freq: Every day | ORAL | 0 refills | Status: DC

## 2021-04-17 MED ORDER — ASPIRIN 81 MG PO CHEW: 324.0000 mg | CHEWABLE_TABLET | Freq: Once | ORAL | Status: DC

## 2021-04-17 MED ORDER — SODIUM CHLORIDE 0.9 % IV SOLN: INTRAVENOUS | Status: DC

## 2021-04-17 MED ORDER — ALUM & MAG HYDROXIDE-SIMETH 200-200-20 MG/5ML PO SUSP
30.0000 mL | Freq: Once | ORAL | Status: AC
  Administered 2021-04-17: 30 mL via ORAL
  Filled 2021-04-17: qty 30

## 2021-04-17 MED ORDER — FAMOTIDINE 20 MG PO TABS
20.0000 mg | ORAL_TABLET | Freq: Once | ORAL | Status: AC
  Administered 2021-04-17: 20 mg via ORAL
  Filled 2021-04-17: qty 1

## 2021-04-17 NOTE — Discharge Instructions (Addendum)
It was our pleasure to provide your ER care today - we hope that you feel better.  We discussed your case with your cardiologists - they indicate for you to follow up closely with them in their office this week - call office tomorrow AM to arrange appointment.   Continue your blood thinner/platelet medications .  Take protonix (acid blocker). You may also try pepcid or maalox if gi/reflux symptoms.   Return to ER right away if worse, new symptoms, recurrent or persistent chest pain, trouble breathing, or other concern.

## 2021-04-17 NOTE — ED Notes (Signed)
RN walked pt to bathroom  

## 2021-04-17 NOTE — ED Provider Notes (Addendum)
[facility] EMERGENCY DEPARTMENT Provider Note   CSN: [id number] Arrival date & time: 04/17/21  1449     History Chief Complaint  Patient presents with  . Chest Pain    Kim Brown is a 70 y.o. female.  Patient with hx cad, recent stent, c/o on and off chest pain in the past two days. Symptoms acute onset, at rest, moderate, last several minutes-hr. Left anterior chest, dull/pressure, radiating towards left shoulder. Similar to recent cardiac chest pain - pt indicates admission/cath w stents ~ 1 month ago. Associated w mild sob, nausea, no diaphoresis. Pain is not constant, and is not pleuritic. No leg pain or swelling. No heartburn. Denies abd pain. No fever or chills. No cough.   The history is provided by the patient and the EMS personnel.  Chest Pain Associated symptoms: no abdominal pain, no back pain, no cough, no fever, no headache and no vomiting        Past Medical History:  Diagnosis Date  . Allergy   . Anemia   . Arthritis    feet and hands , neck, shoulder  . Dysrhythmia 2006   atrial flutter  . Exogenous obesity   . Heel spur   . Hyperlipidemia   . Hypertension   . Hypothyroid   . Irregular heart beats   . Ischemic cardiomyopathy   . Palpitations   . Prolapsed, anus    uses mag oxide powder nightly   . RSD (reflex sympathetic dystrophy)    after surgery on R foot  . STEMI (ST elevation myocardial infarction) ([place])   . Stress incontinence, female     Patient Active Problem List   Diagnosis Date Noted  . ASVD (arteriosclerotic vascular disease) 03/28/2021  . Near syncope 03/27/2021  . Acute systolic heart failure ([place]) 03/22/2021  . Acute anterior wall MI ([place]) 03/20/2021  . Lower urinary tract symptoms (LUTS) 12/01/2020  . Hormone replacement therapy (HRT) 12/01/2020  . Hypothyroidism 12/01/2020  . Presence of orthotic device 12/01/2020  . Dysuria 09/18/2019  . Shoulder pain 09/18/2019  . Healthcare maintenance 05/01/2018  .  Allergic urticaria 01/29/2018  . Perennial allergic rhinitis 01/29/2018  . Allergic reaction 01/29/2018  . Advance care planning 03/21/2017  . Unspecified constipation 06/22/2013  . Dyspnea 06/22/2013  . Hyperlipidemia 09/07/2011    Past Surgical History:  Procedure Laterality Date  . ABDOMINAL HYSTERECTOMY  2005  . BUNIONECTOMY    . COLONOSCOPY    . CORONARY STENT INTERVENTION N/A 03/20/2021   Procedure: CORONARY STENT INTERVENTION;  Surgeon: Jettie Booze, MD;  Location: [facility];  Service: Cardiovascular;  Laterality: N/A;  . CORONARY/GRAFT ACUTE MI REVASCULARIZATION N/A 03/20/2021   Procedure: Coronary/Graft Acute MI Revascularization;  Surgeon: Jettie Booze, MD;  Location: [facility];  Service: Cardiovascular;  Laterality: N/A;  . DILATION AND CURETTAGE OF UTERUS    . LEFT HEART CATH AND CORONARY ANGIOGRAPHY N/A 03/20/2021   Procedure: LEFT HEART CATH AND CORONARY ANGIOGRAPHY;  Surgeon: Jettie Booze, MD;  Location: [facility];  Service: Cardiovascular;  Laterality: N/A;  . REVERSE SHOULDER ARTHROPLASTY Right 07/15/2020   Procedure: REVERSE SHOULDER ARTHROPLASTY;  Surgeon: Tania Ade, MD;  Location: [facility];  Service: Orthopedics;  Laterality: Right;     OB History   No obstetric history on file.     Family History  Problem Relation Age of Onset  . Arthritis Mother   . Hypertension Mother   . Colon polyps Mother   .  Heart disease Father   . Hypertension Father   . Arthritis Father   . Stroke Father   . Heart failure Brother   . Diabetes Brother   . Pancreatic cancer Brother   . Colon cancer Neg Hx   . Breast cancer Neg Hx   . Esophageal cancer Neg Hx   . Rectal cancer Neg Hx   . Stomach cancer Neg Hx     Social History   Tobacco Use  . Smoking status: Never Smoker  . Smokeless tobacco: Never Used  Vaping Use  . Vaping Use: Never used  Substance Use Topics  . Alcohol use: No  . Drug use: No    Home  Medications Prior to Admission medications   Medication Sig Start Date End Date Taking? Authorizing Provider  Ascorbic Acid (VITAMIN C) 1000 MG tablet Take 1,000 mg by mouth 2 (two) times daily.    [provider]  aspirin 81 MG EC tablet Take 1 tablet (81 mg total) by mouth daily. 03/22/21 03/22/22  Laverda Pageoberts, Lindsay B, NP  b complex vitamins tablet Take 1 tablet by mouth 3 (three) times a week.    [provider]  beta carotene [id number] UNIT capsule Take 10,000 Units by mouth daily.    [provider]  Calcium Carbonate-Vitamin D (CALTRATE 600+D PO) Take 1 tablet by mouth daily.    [provider]  Cholecalciferol (VITAMIN D) 2000 UNITS CAPS Take 1 capsule by mouth 2 (two) times daily.    [provider]  clopidogrel (PLAVIX) 75 MG tablet Take 1 tablet (75 mg total) by mouth daily. Take 600 mg (8 tablets) tonight as a loading dose, then 75 mg daily (1 tablet) 04/04/21   Wendall StadeNishan, Peter C, MD  DHEA 25 MG CAPS Take 25 mg by mouth daily.    [provider]  fish oil-omega-3 fatty acids 1000 MG capsule Take 1 g by mouth daily.    [provider]  Magnesium 250 MG TABS Take 250 mg by mouth daily.    [provider]  Melatonin 10 MG TABS Take 10 mg by mouth at bedtime.     [provider]  nitroGLYCERIN (NITROSTAT) 0.4 MG SL tablet Place 1 tablet (0.4 mg total) under the tongue every 5 (five) minutes as needed for chest pain. 03/31/21 03/31/22  Corky CraftsVaranasi, Jayadeep S, MD  phenylephrine (SUDAFED PE) 10 MG TABS tablet Take 10 mg by mouth every 4 (four) hours as needed (sinus).    [provider]  Polyethyl Glycol-Propyl Glycol (SYSTANE) 0.4-0.3 % SOLN Place 1 drop into both eyes 2 (two) times daily.    [provider]  progesterone (PROMETRIUM) 100 MG capsule Take 300 mg by mouth at bedtime.  03/26/20   [provider]  rosuvastatin (CRESTOR) 40 MG tablet Take 1 tablet (40 mg total) by mouth daily. 03/31/21    Corky CraftsVaranasi, Jayadeep S, MD  thyroid (ARMOUR) 60 MG tablet Take one tablet in am and one tablet in pm. 03/29/21   Arrien, York RamMauricio Daniel, MD  zinc gluconate 50 MG tablet Take 50 mg by mouth daily.    [provider]    Allergies    Metoprolol succinate [metoprolol], Albuterol, and Lovastatin  Review of Systems   Review of Systems  Constitutional: Negative for chills and fever.  HENT: Negative for sore throat.   Eyes: Negative for redness.  Respiratory: Negative for cough.   Cardiovascular: Positive for chest pain. Negative for leg swelling.  Gastrointestinal: Negative for abdominal  pain and vomiting.  Genitourinary: Negative for flank pain.  Musculoskeletal: Negative for back pain and neck pain.  Skin: Negative for rash.  Neurological: Negative for headaches.  Hematological: Does not bruise/bleed easily.  Psychiatric/Behavioral: Negative for confusion.    Physical Exam Updated Vital Signs BP 121/76   Pulse 69   Temp 99 F (37.2 C) (Oral)   Resp 12   Ht 1.626 m (5\' 4" )   Wt 72.1 kg   SpO2 99%   BMI 27.29 kg/m   Physical Exam Vitals and nursing note reviewed.  Constitutional:      Appearance: Normal appearance. She is well-developed.  HENT:     Head: Atraumatic.     Nose: Nose normal.     Mouth/Throat:     Mouth: Mucous membranes are moist.  Eyes:     General: No scleral icterus.    Conjunctiva/sclera: Conjunctivae normal.  Neck:     Trachea: No tracheal deviation.  Cardiovascular:     Rate and Rhythm: Normal rate and regular rhythm.     Pulses: Normal pulses.     Heart sounds: Normal heart sounds. No murmur heard. No friction rub. No gallop.   Pulmonary:     Effort: Pulmonary effort is normal. No respiratory distress.     Breath sounds: Normal breath sounds.  Chest:     Chest wall: No tenderness.  Abdominal:     General: Bowel sounds are normal. There is no distension.     Palpations: Abdomen is soft.     Tenderness: There is no abdominal  tenderness. There is no guarding.  Genitourinary:    Comments: No cva tenderness.  Musculoskeletal:        General: No swelling or tenderness.     Cervical back: Normal range of motion and neck supple. No rigidity. No muscular tenderness.     Right lower leg: No edema.     Left lower leg: No edema.  Skin:    General: Skin is warm and dry.     Findings: No rash.  Neurological:     Mental Status: She is alert.     Comments: Alert, speech normal.   Psychiatric:        Mood and Affect: Mood normal.     ED Results / Procedures / Treatments   Labs (all labs ordered are listed, but only abnormal results are displayed) Results for orders placed or performed during the hospital encounter of 04/17/21  Resp Panel by RT-PCR (Flu A&B, Covid) Nasopharyngeal Swab   Specimen: Nasopharyngeal Swab; Nasopharyngeal(NP) swabs in vial transport medium  Result Value Ref Range   SARS Coronavirus 2 by RT PCR NEGATIVE NEGATIVE   Influenza A by PCR NEGATIVE NEGATIVE   Influenza B by PCR NEGATIVE NEGATIVE  Protime-INR  Result Value Ref Range   Prothrombin Time 13.0 11.4 - 15.2 seconds   INR 1.0 0.8 - 1.2  APTT  Result Value Ref Range   aPTT 31 24 - 36 seconds  Comprehensive metabolic panel  Result Value Ref Range   Sodium 140 135 - 145 mmol/L   Potassium 3.6 3.5 - 5.1 mmol/L   Chloride 109 98 - 111 mmol/L   CO2 21 (L) 22 - 32 mmol/L   Glucose, Bld 100 (H) 70 - 99 mg/dL   BUN 11 8 - 23 mg/dL   Creatinine, Ser 0.96 0.44 - 1.00 mg/dL   Calcium 9.4 8.9 - 10.3 mg/dL   Total Protein 7.1 6.5 - 8.1 g/dL   Albumin 3.9  3.5 - 5.0 g/dL   AST 18 15 - 41 U/L   ALT 15 0 - 44 U/L   Alkaline Phosphatase 77 38 - 126 U/L   Total Bilirubin 0.8 0.3 - 1.2 mg/dL   GFR, Estimated >60 >60 mL/min   Anion gap 10 5 - 15  Lipid panel  Result Value Ref Range   Cholesterol 156 0 - 200 mg/dL   Triglycerides 132 <150 mg/dL   HDL 41 >40 mg/dL   Total CHOL/HDL Ratio 3.8 RATIO   VLDL 26 0 - 40 mg/dL   LDL Cholesterol  89 0 - 99 mg/dL  CBC with Differential/Platelet  Result Value Ref Range   WBC 9.7 4.0 - 10.5 K/uL   RBC 4.99 3.87 - 5.11 MIL/uL   Hemoglobin 15.4 (H) 12.0 - 15.0 g/dL   HCT 45.4 36.0 - 46.0 %   MCV 91.0 80.0 - 100.0 fL   MCH 30.9 26.0 - 34.0 pg   MCHC 33.9 30.0 - 36.0 g/dL   RDW 13.2 11.5 - 15.5 %   Platelets 288 150 - 400 K/uL   nRBC 0.0 0.0 - 0.2 %   Neutrophils Relative % 76 %   Neutro Abs 7.4 1.7 - 7.7 K/uL   Lymphocytes Relative 17 %   Lymphs Abs 1.6 0.7 - 4.0 K/uL   Monocytes Relative 6 %   Monocytes Absolute 0.6 0.1 - 1.0 K/uL   Eosinophils Relative 1 %   Eosinophils Absolute 0.1 0.0 - 0.5 K/uL   Basophils Relative 0 %   Basophils Absolute 0.0 0.0 - 0.1 K/uL   Immature Granulocytes 0 %   Abs Immature Granulocytes 0.04 0.00 - 0.07 K/uL  Troponin I (High Sensitivity)  Result Value Ref Range   Troponin I (High Sensitivity) 11 <18 ng/L  Troponin I (High Sensitivity)  Result Value Ref Range   Troponin I (High Sensitivity) 13 <18 ng/L   CARDIAC CATHETERIZATION  Result Date: 03/20/2021  Mid LAD-1 lesion is 100% stenosed. After thrombectomy, a drug-eluting stent was successfully placed using a SYNERGY XD 3.0X16, postdilated to 3.75 mm.  Post intervention, there is a 0% residual stenosis.  Mid LAD-2 lesion is 95% stenosed. A drug-eluting stent was successfully placed using a SYNERGY XD 2.25X28, postdilated to 2.5 mm.  Post intervention, there is a 0% residual stenosis.  Mid Cx lesion is 90% stenosed. A drug-eluting stent was successfully placed using a SYNERGY XD 3.50X16, postdilated to 3.75 mm.  Post intervention, there is a 0% residual stenosis.  2nd Diag lesion is 50% stenosed.  Dist LAD lesion is 25% stenosed. The mid to distal LAD was a very small vessel in general.  There is mild to moderate left ventricular systolic dysfunction.  LV end diastolic pressure is moderately elevated. LVEDP 23 mm Hg.  The left ventricular ejection fraction is 35-45% by visual estimate.   There is no aortic valve stenosis.  Continue dual antiplatelet therapy for 12 months.  She will need aggressive secondary prevention.  She had myalgias with lovastatin.  Start rosuvastatin 20 mg daily.  She will need metoprolol and likely an ACE inhibitor depending on her ejection fraction.  We will plan for echocardiogram on Monday.  No IV fluids due to elevated LVEDP in the setting of anterior wall MI and decreased LVEF. I discussed the findings with her husband.   DG Chest Port 1 View  Result Date: 04/17/2021 CLINICAL DATA:  Chest pain. EXAM: PORTABLE CHEST 1 VIEW COMPARISON:  March 27, 2021 FINDINGS: The heart  size and mediastinal contours are within normal limits. Both lungs are clear. The visualized skeletal structures are unremarkable. IMPRESSION: No active disease. Electronically Signed   By: Dorise Bullion III M.D   On: 04/17/2021 16:15   DG Chest Port 1 View  Result Date: 03/27/2021 CLINICAL DATA:  Weakness EXAM: PORTABLE CHEST 1 VIEW COMPARISON:  03/20/2021 FINDINGS: The heart size and mediastinal contours are within normal limits. Both lungs are clear. Partially visualized right shoulder replacement. IMPRESSION: No active disease. Electronically Signed   By: Donavan Foil M.D.   On: 03/27/2021 20:15   DG Chest Port 1 View  Result Date: 03/20/2021 CLINICAL DATA:  Chest pain EXAM: PORTABLE CHEST 1 VIEW COMPARISON:  07/12/2020 FINDINGS: Heart and mediastinal contours are within normal limits. No focal opacities or effusions. No acute bony abnormality. IMPRESSION: No active disease. Electronically Signed   By: Rolm Baptise M.D.   On: 03/20/2021 02:43   ECHOCARDIOGRAM COMPLETE  Result Date: 03/20/2021    ECHOCARDIOGRAM REPORT   Patient Name:   Kim Brown Date of Exam: 03/20/2021 Medical Rec #:  [id number]     Height:       64.0 in Accession #:    [id number]    Weight:       166.7 lb Date of Birth:  01/11/1951      BSA:          1.811 m Patient Age:    16 years      BP:           104/67 mmHg  Patient Gender: F             HR:           97 bpm. Exam Location:  Inpatient Procedure: 2D Echo, Cardiac Doppler and Color Doppler Indications:    Acute myocardial infarction  History:        Patient has no prior history of Echocardiogram examinations.                 Risk Factors:Hypertension and Dyslipidemia.  Sonographer:    Clayton Lefort RDCS (AE) Referring Phys: [id number] MARK C SKAINS IMPRESSIONS  1. Left ventricular ejection fraction, by estimation, is 40 to 45%. The left ventricle has mildly decreased function. The left ventricle has no regional wall motion abnormalities. There is mild left ventricular hypertrophy. Left ventricular diastolic parameters are consistent with Grade I diastolic dysfunction (impaired relaxation).  2. Right ventricular systolic function is normal. The right ventricular size is normal. There is mildly elevated pulmonary artery systolic pressure.  3. The mitral valve is normal in structure. No evidence of mitral valve regurgitation. No evidence of mitral stenosis.  4. The aortic valve is normal in structure. Aortic valve regurgitation is not visualized. No aortic stenosis is present.  5. The inferior vena cava is dilated in size with <50% respiratory variability, suggesting right atrial pressure of 15 mmHg. FINDINGS  Left Ventricle: Left ventricular ejection fraction, by estimation, is 40 to 45%. The left ventricle has mildly decreased function. The left ventricle has no regional wall motion abnormalities. Definity contrast agent was given IV to delineate the left ventricular endocardial borders. The left ventricular internal cavity size was normal in size. There is mild left ventricular hypertrophy. Left ventricular diastolic parameters are consistent with Grade I diastolic dysfunction (impaired relaxation).  LV Wall Scoring: The apical inferior segment and apex are akinetic. The entire anterior septum and mid inferoseptal segment are hypokinetic. Right Ventricle: The right ventricular  size is normal.  No increase in right ventricular wall thickness. Right ventricular systolic function is normal. There is mildly elevated pulmonary artery systolic pressure. The tricuspid regurgitant velocity is 2.45  m/s, and with an assumed right atrial pressure of 15 mmHg, the estimated right ventricular systolic pressure is 123XX123 mmHg. Left Atrium: Left atrial size was normal in size. Right Atrium: Right atrial size was normal in size. Pericardium: There is no evidence of pericardial effusion. Mitral Valve: The mitral valve is normal in structure. No evidence of mitral valve regurgitation. No evidence of mitral valve stenosis. MV peak gradient, 6.8 mmHg. The mean mitral valve gradient is 2.0 mmHg. Tricuspid Valve: The tricuspid valve is normal in structure. Tricuspid valve regurgitation is trivial. No evidence of tricuspid stenosis. Aortic Valve: The aortic valve is normal in structure. Aortic valve regurgitation is not visualized. No aortic stenosis is present. Aortic valve mean gradient measures 4.0 mmHg. Aortic valve peak gradient measures 6.8 mmHg. Aortic valve area, by VTI measures 2.46 cm. Pulmonic Valve: The pulmonic valve was normal in structure. Pulmonic valve regurgitation is not visualized. No evidence of pulmonic stenosis. Aorta: The aortic root is normal in size and structure. Venous: The inferior vena cava is dilated in size with less than 50% respiratory variability, suggesting right atrial pressure of 15 mmHg. IAS/Shunts: No atrial level shunt detected by color flow Doppler.  LEFT VENTRICLE PLAX 2D LVIDd:         4.20 cm      Diastology LVIDs:         3.20 cm      LV e' medial:    5.87 cm/s LV PW:         1.20 cm      LV E/e' medial:  12.5 LV IVS:        1.10 cm      LV e' lateral:   6.85 cm/s LVOT diam:     1.85 cm      LV E/e' lateral: 10.7 LV SV:         61 LV SV Index:   34 LVOT Area:     2.69 cm  LV Volumes (MOD) LV vol d, MOD A2C: 105.0 ml LV vol d, MOD A4C: 78.6 ml LV vol s, MOD A2C: 60.7  ml LV vol s, MOD A4C: 38.9 ml LV SV MOD A2C:     44.3 ml LV SV MOD A4C:     78.6 ml LV SV MOD BP:      44.0 ml RIGHT VENTRICLE             IVC RV Basal diam:  2.60 cm     IVC diam: 2.20 cm RV S prime:     17.60 cm/s TAPSE (M-mode): 2.2 cm LEFT ATRIUM             Index       RIGHT ATRIUM           Index LA diam:        2.80 cm 1.55 cm/m  RA Area:     11.40 cm LA Vol (A2C):   30.3 ml 16.74 ml/m RA Volume:   26.20 ml  14.47 ml/m LA Vol (A4C):   24.8 ml 13.70 ml/m LA Biplane Vol: 28.1 ml 15.52 ml/m  AORTIC VALVE AV Area (Vmax):    2.54 cm AV Area (Vmean):   1.99 cm AV Area (VTI):     2.46 cm AV Vmax:           130.00  cm/s AV Vmean:          93.200 cm/s AV VTI:            0.249 m AV Peak Grad:      6.8 mmHg AV Mean Grad:      4.0 mmHg LVOT Vmax:         123.00 cm/s LVOT Vmean:        69.100 cm/s LVOT VTI:          0.228 m LVOT/AV VTI ratio: 0.92  AORTA Ao Root diam: 3.10 cm Ao Asc diam:  3.30 cm MITRAL VALVE                TRICUSPID VALVE MV Area (PHT): 3.37 cm     TR Peak grad:   24.0 mmHg MV Area VTI:   2.68 cm     TR Vmax:        245.00 cm/s MV Peak grad:  6.8 mmHg MV Mean grad:  2.0 mmHg     SHUNTS MV Vmax:       1.30 m/s     Systemic VTI:  0.23 m MV Vmean:      65.1 cm/s    Systemic Diam: 1.85 cm MV Decel Time: 225 msec MV E velocity: 73.10 cm/s MV A velocity: 118.00 cm/s MV E/A ratio:  0.62 Candee Furbish MD Electronically signed by Candee Furbish MD Signature Date/Time: 03/20/2021/4:27:59 PM    Final     EKG EKG Interpretation  Date/Time:  Sunday Apr 17 2021 14:49:46 EDT Ventricular Rate:  80 PR Interval:  147 QRS Duration: 94 QT Interval:  413 QTC Calculation: 477 R Axis:   -73 Text Interpretation: Sinus rhythm Left anterior fascicular block  `t inv v1-v2, avl Confirmed by Lajean Saver [phone]) on 04/17/2021 3:21:01 PM   Radiology DG Chest Port 1 View  Result Date: 04/17/2021 CLINICAL DATA:  Chest pain. EXAM: PORTABLE CHEST 1 VIEW COMPARISON:  March 27, 2021 FINDINGS: The heart size and  mediastinal contours are within normal limits. Both lungs are clear. The visualized skeletal structures are unremarkable. IMPRESSION: No active disease. Electronically Signed   By: Dorise Bullion III M.D   On: 04/17/2021 16:15    Procedures Procedures   Medications Ordered in ED Medications - No data to display  ED Course  I have reviewed the triage vital signs and the nursing notes.  Pertinent labs & imaging results that were available during my care of the patient were reviewed by me and considered in my medical decision making (see chart for details).    MDM Rules/Calculators/A&P                          Iv ns. Continuous pulse ox and cardiac monitoring. Stat labs. Ecg. Cxr.   Reviewed nursing notes and prior charts for additional history.  Admission ~ 1 month ago w cath/stents - reviewed.   Labs reviewed/interpreted by me - initial trop normal.   CXR reviewed/interpreted by me - no pna.  Cardiology consulted. Discussed pt, recent hx/stent. They reviewed recent records and cath, reviewed ecg - she indicates if 2nd trop not significantly elevated to d/c to home and that no indication for additional inpatient workup and/or other emergent testing.   Recheck pt, no current cp or sob. Delta trop pending.   Delta trop normal.  Will d/c per cardiology recommendation with close outpt f/u with them.   Cardiology called back after 2nd trop result -  they request we also give rx protonix, and she will facilitate close f/u in next 1-2 days.   Return precautions provided.      Final Clinical Impression(s) / ED Diagnoses Final diagnoses:  None    Rx / DC Orders ED Discharge Orders    None         Lajean Saver, MD 04/17/21 2130

## 2021-04-17 NOTE — ED Triage Notes (Signed)
Pt BIB EMS due to CP. Pt took 324 of aspirin. Pt had a NSTEMI a couple of weeks ago ansd has been taken plavix. Pt denies chest p[ain at this time. Pt is axox4. VSS.

## 2021-04-17 NOTE — Consult Note (Signed)
Cardiology Consultation:   Patient ID: Kim Brown MRN: [id number]; DOB: May 25, 1951  Admit date: 04/17/2021 Date of Consult: 04/17/2021  PCP:  Tonia Ghent, MD   [place]  Cardiologist:  Larae Grooms, MD   Advanced Practice Provider:  No care team member to display Electrophysiologist:  None   Patient Profile:   Kim Brown is a 70 y.o. female with a hx of CAD s/p recent multivessel PCI who is being seen today for the evaluation of chest pain and lightheadedness at the request of Dr. Ashok Cordia.  History of Present Illness:   Ms. Wiens was hospitalized in early April for chest pain and underwent a LHC on 4/3 with PCI to her LAD x 2 and LCX x 1. Since she was discharged, she reports that she has continued to feel poorly with "waves" of chest pain radiating to her back, some nausea, lightheadedness and dizziness. She stopped her metoprolol with some improvement in her symptoms. She was hospitalized for these symptoms from 4/10-4/12 with negative evaluation. She has continued to walk 1-2 times a day without difficulty. She denies any chest pain or shortness of breath when walking. She had a repeat "wave" of feeling poor yesterday afternoon and took 1 SL nitro with some improvement. She felt ok today but then had a longer episode this afternoon. She took her blood pressure and oxygen saturation during the episode and reports that both were normal (130s/70s, O2 sat 98%). She took 1 SL nitro with no relief and then took 2 additional tablets with no relief. Given her persistent discomfort despite 3 SL nitro, she presented to the ER. Her symptoms resolved en route. She notes that she has been having quite a bit more reflux and that often her pain resolves with belching. She has had similar episodes of pain while in the ER each lasting a few seconds that resolve with belching. She denies any current chest pain, shortness of breath, lightheadedness, dizziness, lower leg  swelling, orthopnea or PND. She does note that occasionally she feels like her heart has extra beats.    Past Medical History:  Diagnosis Date  . Allergy   . Anemia   . Arthritis    feet and hands , neck, shoulder  . Dysrhythmia 2006   atrial flutter  . Exogenous obesity   . Heel spur   . Hyperlipidemia   . Hypertension   . Hypothyroid   . Irregular heart beats   . Ischemic cardiomyopathy   . Palpitations   . Prolapsed, anus    uses mag oxide powder nightly   . RSD (reflex sympathetic dystrophy)    after surgery on R foot  . STEMI (ST elevation myocardial infarction) ([place])   . Stress incontinence, female     Past Surgical History:  Procedure Laterality Date  . ABDOMINAL HYSTERECTOMY  2005  . BUNIONECTOMY    . COLONOSCOPY    . CORONARY STENT INTERVENTION N/A 03/20/2021   Procedure: CORONARY STENT INTERVENTION;  Surgeon: Jettie Booze, MD;  Location: [facility];  Service: Cardiovascular;  Laterality: N/A;  . CORONARY/GRAFT ACUTE MI REVASCULARIZATION N/A 03/20/2021   Procedure: Coronary/Graft Acute MI Revascularization;  Surgeon: Jettie Booze, MD;  Location: [facility];  Service: Cardiovascular;  Laterality: N/A;  . DILATION AND CURETTAGE OF UTERUS    . LEFT HEART CATH AND CORONARY ANGIOGRAPHY N/A 03/20/2021   Procedure: LEFT HEART CATH AND CORONARY ANGIOGRAPHY;  Surgeon: Jettie Booze, MD;  Location: [facility]  INVASIVE CV LAB;  Service: Cardiovascular;  Laterality: N/A;  . REVERSE SHOULDER ARTHROPLASTY Right 07/15/2020   Procedure: REVERSE SHOULDER ARTHROPLASTY;  Surgeon: Tania Ade, MD;  Location: [facility];  Service: Orthopedics;  Laterality: Right;     Home Medications:  Prior to Admission medications   Medication Sig Start Date End Date Taking? Authorizing Provider  Ascorbic Acid (VITAMIN C) 1000 MG tablet Take 1,000 mg by mouth 2 (two) times daily.    [provider]  aspirin 81 MG EC tablet Take 1 tablet (81 mg total) by mouth  daily. 03/22/21 03/22/22  Reino Bellis B, NP  b complex vitamins tablet Take 1 tablet by mouth 3 (three) times a week.    [provider]  beta carotene 10000 UNIT capsule Take 10,000 Units by mouth daily.    [provider]  Calcium Carbonate-Vitamin D (CALTRATE 600+D PO) Take 1 tablet by mouth daily.    [provider]  Cholecalciferol (VITAMIN D) 2000 UNITS CAPS Take 1 capsule by mouth 2 (two) times daily.    [provider]  clopidogrel (PLAVIX) 75 MG tablet Take 1 tablet (75 mg total) by mouth daily. Take 600 mg (8 tablets) tonight as a loading dose, then 75 mg daily (1 tablet) 04/04/21   Josue Hector, MD  DHEA 25 MG CAPS Take 25 mg by mouth daily.    [provider]  fish oil-omega-3 fatty acids 1000 MG capsule Take 1 g by mouth daily.    [provider]  Magnesium 250 MG TABS Take 250 mg by mouth daily.    [provider]  Melatonin 10 MG TABS Take 10 mg by mouth at bedtime.     [provider]  nitroGLYCERIN (NITROSTAT) 0.4 MG SL tablet Place 1 tablet (0.4 mg total) under the tongue every 5 (five) minutes as needed for chest pain. 03/31/21 03/31/22  Jettie Booze, MD  phenylephrine (SUDAFED PE) 10 MG TABS tablet Take 10 mg by mouth every 4 (four) hours as needed (sinus).    [provider]  Polyethyl Glycol-Propyl Glycol (SYSTANE) 0.4-0.3 % SOLN Place 1 drop into both eyes 2 (two) times daily.    [provider]  progesterone (PROMETRIUM) 100 MG capsule Take 300 mg by mouth at bedtime.  03/26/20   [provider]  rosuvastatin (CRESTOR) 40 MG tablet Take 1 tablet (40 mg total) by mouth daily. 03/31/21   Jettie Booze, MD  thyroid (ARMOUR) 60 MG tablet Take one tablet in am and one tablet in pm. 03/29/21   Arrien, Jimmy Picket, MD  zinc gluconate 50 MG tablet Take 50 mg by mouth daily.    [provider]    Inpatient Medications: Scheduled Meds: . aspirin  324 mg Oral  Once  . heparin  4,000 Units Intravenous Once   Continuous Infusions: . sodium chloride 10 mL/hr at 04/17/21 1602   PRN Meds:   Allergies:    Allergies  Allergen Reactions  . Metoprolol Succinate [Metoprolol] Shortness Of Breath    Tongue swelling, fatigue  . Albuterol     Jittery sensation  . Lovastatin     Myalgias    Social History:   Social History   Socioeconomic History  . Marital status: Married    Spouse name: Not on file  . Number of children: Not on file  . Years of education: Not on file  . Highest education level: Not on file  Occupational History  . Not on file  Tobacco Use  . Smoking status: Never Smoker  . Smokeless tobacco: Never Used  Vaping Use  . Vaping Use: Never used  Substance and Sexual Activity  . Alcohol use: No  . Drug use: No  . Sexual activity: Yes  Other Topics Concern  . Not on file  Social History Narrative   Married 1972   2 daughters Gari [place] Bells is one) and 1 son   9 grandkids    Occ subs at [employer]   Social Determinants of Radio broadcast assistant Strain: Low Risk   . Difficulty of Paying Living Expenses: Not hard at all  Food Insecurity: No Food Insecurity  . Worried About Charity fundraiser in the Last Year: Never true  . Ran Out of Food in the Last Year: Never true  Transportation Needs: No Transportation Needs  . Lack of Transportation (Medical): No  . Lack of Transportation (Non-Medical): No  Physical Activity: Insufficiently Active  . Days of Exercise per Week: 7 days  . Minutes of Exercise per Session: 10 min  Stress: No Stress Concern Present  . Feeling of Stress : Not at all  Social Connections: Not on file  Intimate Partner Violence: Not At Risk  . Fear of Current or Ex-Partner: No  . Emotionally Abused: No  . Physically Abused: No  . Sexually Abused: No    Family History:    Family History  Problem Relation Age of Onset  . Arthritis Mother   . Hypertension Mother   . Colon  polyps Mother   . Heart disease Father   . Hypertension Father   . Arthritis Father   . Stroke Father   . Heart failure Brother   . Diabetes Brother   . Pancreatic cancer Brother   . Colon cancer Neg Hx   . Breast cancer Neg Hx   . Esophageal cancer Neg Hx   . Rectal cancer Neg Hx   . Stomach cancer Neg Hx      ROS:  Please see the history of present illness.   All other ROS reviewed and negative.     Physical Exam/Data:   Vitals:   04/17/21 1945 04/17/21 2015 04/17/21 2030 04/17/21 2045  BP: 136/82 128/88 127/76 111/67  Pulse: 81 72 69 71  Resp: 17 16 14 14   Temp:      TempSrc:      SpO2: 100% 100% 99% 98%  Weight:      Height:       No intake or output data in the 24 hours ending 04/17/21 2050 Last 3 Weights 04/17/2021 03/31/2021 03/29/2021  Weight (lbs) 159 lb 158 lb 154 lb 14.4 oz  Weight (kg) 72.122 kg 71.668 kg 70.262 kg     Body mass index is 27.29 kg/m.  General:  Well nourished, well developed, in no acute distress HEENT: normal Neck: no JVD Endocrine:  No thryomegaly Vascular: No carotid bruits; FA pulses 2+ bilaterally without bruits  Cardiac:  normal S1, S2; RRR; no murmur Lungs:  clear to auscultation bilaterally, no wheezing, rhonchi or rales  Abd: soft, nontender, no hepatomegaly  Ext: no edema Musculoskeletal:  No deformities, BUE and BLE strength normal and equal Skin: warm and dry  Neuro:  CNs 2-12 intact, no focal abnormalities noted Psych:  Normal affect   EKG:  The EKG was personally reviewed and demonstrates:  Sinus rhythm with T-wave inversions in V1 and V2 improved from prior  Telemetry:  Telemetry was personally reviewed and  demonstrates:  Sinus rhythm with occasional PVCs  Relevant CV Studies: LHC 03/20/21  Mid LAD-1 lesion is 100% stenosed. After thrombectomy, a drug-eluting stent was successfully placed using a SYNERGY XD 3.0X16, postdilated to 3.75 mm.  Post intervention, there is a 0% residual stenosis.  Mid LAD-2 lesion is 95%  stenosed. A drug-eluting stent was successfully placed using a SYNERGY XD 2.25X28, postdilated to 2.5 mm.  Post intervention, there is a 0% residual stenosis.  Mid Cx lesion is 90% stenosed. A drug-eluting stent was successfully placed using a SYNERGY XD 3.50X16, postdilated to 3.75 mm.  Post intervention, there is a 0% residual stenosis.  2nd Diag lesion is 50% stenosed.  Dist LAD lesion is 25% stenosed. The mid to distal LAD was a very small vessel in general.  There is mild to moderate left ventricular systolic dysfunction.  LV end diastolic pressure is moderately elevated. LVEDP 23 mm Hg.  The left ventricular ejection fraction is 35-45% by visual estimate.  There is no aortic valve stenosis.   Echo 03/20/21 1. Left ventricular ejection fraction, by estimation, is 40 to 45%. The  left ventricle has mildly decreased function. The left ventricle has no  regional wall motion abnormalities. There is mild left ventricular  hypertrophy. Left ventricular diastolic  parameters are consistent with Grade I diastolic dysfunction (impaired  relaxation).  2. Right ventricular systolic function is normal. The right ventricular  size is normal. There is mildly elevated pulmonary artery systolic  pressure.  3. The mitral valve is normal in structure. No evidence of mitral valve  regurgitation. No evidence of mitral stenosis.  4. The aortic valve is normal in structure. Aortic valve regurgitation is  not visualized. No aortic stenosis is present.  5. The inferior vena cava is dilated in size with <50% respiratory  variability, suggesting right atrial pressure of 15 mmHg.   Laboratory Data:  High Sensitivity Troponin:   Recent Labs  Lab 03/20/21 0549 03/21/21 0536 03/27/21 2021 03/27/21 2115 04/17/21 1612  TROPONINIHS 7,259* 19,551* 69* 75* 11     Chemistry Recent Labs  Lab 04/17/21 1612  NA 140  K 3.6  CL 109  CO2 21*  GLUCOSE 100*  BUN 11  CREATININE 0.96  CALCIUM  9.4  GFRNONAA >60  ANIONGAP 10    Recent Labs  Lab 04/17/21 1612  PROT 7.1  ALBUMIN 3.9  AST 18  ALT 15  ALKPHOS 77  BILITOT 0.8   Hematology Recent Labs  Lab 04/17/21 1612  WBC 9.7  RBC 4.99  HGB 15.4*  HCT 45.4  MCV 91.0  MCH 30.9  MCHC 33.9  RDW 13.2  PLT 288   BNPNo results for input(s): BNP, PROBNP in the last 168 hours.  DDimer No results for input(s): DDIMER in the last 168 hours.   Radiology/Studies:  DG Chest Port 1 View  Result Date: 04/17/2021 CLINICAL DATA:  Chest pain. EXAM: PORTABLE CHEST 1 VIEW COMPARISON:  March 27, 2021 FINDINGS: The heart size and mediastinal contours are within normal limits. Both lungs are clear. The visualized skeletal structures are unremarkable. IMPRESSION: No active disease. Electronically Signed   By: Dorise Bullion III M.D   On: 04/17/2021 16:15     Assessment and Plan:   1. Chest pain- She presents with recurrent chest pain associated with back pain, SOB, lightheadedness and dizziness. Her symptoms seem atypical for ACS as they occur only at rest and she has been able to exert herself without difficulty. She had a recent LHC  earlier this month with complete revascularization and her troponins are negative suggesting no acute ACS event. In addition, she has been compliant with her plavix. Importantly, she was hospitalized 4/10-4/12 with similar symptoms with negative evaluation. She does endorse a significant component of reflux and belching. Have recommended that she take protonix 40mg  BID for now to see if that helps at all. Have also recommended that she get a Holter monitor to ensure that she is not having any arrhythmias given her associated palpitations. Given the low likelihood of ACS, believe she is safe for discharge and she and her husband are agreeable to this. Will update Dr. Irish Lack on her presentation and arrange for outpatient follow-up on 5/2.   For questions or updates, please contact [place] Please  consult [email or web address] for contact info under    Signed, Princella Pellegrini, MD  04/17/2021 8:50 PM

## 2021-04-18 ENCOUNTER — Ambulatory Visit (INDEPENDENT_AMBULATORY_CARE_PROVIDER_SITE_OTHER): Payer: PPO

## 2021-04-18 ENCOUNTER — Telehealth: Payer: Self-pay | Admitting: Interventional Cardiology

## 2021-04-18 ENCOUNTER — Encounter: Payer: Self-pay | Admitting: Radiology

## 2021-04-18 DIAGNOSIS — R002 Palpitations: Secondary | ICD-10-CM

## 2021-04-18 DIAGNOSIS — R5383 Other fatigue: Secondary | ICD-10-CM

## 2021-04-18 NOTE — Telephone Encounter (Signed)
Order for zio patch placed.  Instructions sent via MyChart.

## 2021-04-18 NOTE — Progress Notes (Signed)
Enrolled patient for a [place] XT monitor to be mailed to patients home.

## 2021-04-18 NOTE — Telephone Encounter (Signed)
OK for 2 week Zio patch..for palpitations and fatigue.  I think we can just do the monitor and see the results since she was just seen in ER with ECG and troponins/labs done.   JV

## 2021-04-18 NOTE — Telephone Encounter (Signed)
1. Chest pain- She presents with recurrent chest pain associated with back pain, SOB, lightheadedness and dizziness. Her symptoms seem atypical for ACS as they occur only at rest and she has been able to exert herself without difficulty. She had a recent LHC earlier this month with complete revascularization and her troponins are negative suggesting no acute ACS event. In addition, she has been compliant with her plavix. Importantly, she was hospitalized 4/10-4/12 with similar symptoms with negative evaluation. She does endorse a significant component of reflux and belching. Have recommended that she take protonix 40mg  BID for now to see if that helps at all. Have also recommended that she get a Holter monitor to ensure that she is not having any arrhythmias given her associated palpitations. Given the low likelihood of ACS, believe she is safe for discharge and she and her husband are agreeable to this. Will update Dr. Irish Lack on her presentation and arrange for outpatient follow-up on 5/2.   For questions or updates, please contact [place] Please consult [email or web address] for contact info under    Signed, Princella Pellegrini, MD  04/17/2021 8:50 PM  The above information is from consult note in ED 04/17/2021.  No orders for holter monitor noted in chart.  Will have Dr Irish Lack to review for orders.  Husband - Darryl on DPR aware they will be contacted regarding the monitor and when f/u appt should be.

## 2021-04-18 NOTE — Telephone Encounter (Signed)
Darryl is calling stating the hospital advised Kim Brown to f/u in two days to receive a holter monitor. There are no available appts before 05/23/21 with Kim Brown. Please advise.

## 2021-04-22 DIAGNOSIS — R5383 Other fatigue: Secondary | ICD-10-CM | POA: Diagnosis not present

## 2021-04-22 DIAGNOSIS — R002 Palpitations: Secondary | ICD-10-CM

## 2021-05-12 DIAGNOSIS — R002 Palpitations: Secondary | ICD-10-CM | POA: Diagnosis not present

## 2021-05-12 DIAGNOSIS — R5383 Other fatigue: Secondary | ICD-10-CM | POA: Diagnosis not present

## 2021-05-19 ENCOUNTER — Telehealth: Payer: Self-pay | Admitting: Interventional Cardiology

## 2021-05-19 NOTE — Telephone Encounter (Signed)
Spoke with patient and gave results of heart monitor.  Continue with current medications.  NSR with a few early beats, no a fib.  Patient appreciative of return call.

## 2021-05-19 NOTE — Telephone Encounter (Signed)
PT is returning a call 

## 2021-05-30 ENCOUNTER — Telehealth (HOSPITAL_COMMUNITY): Payer: Self-pay | Admitting: Pharmacist

## 2021-05-30 NOTE — Telephone Encounter (Signed)
Cardiac Rehab Medication Review by a Pharmacist  Does the patient  feel that his/her medications are working for him/her?  yes  Has the patient been experiencing any side effects to the medications prescribed?  no  Does the patient measure his/her own blood pressure or blood glucose at home?  yes   Does the patient have any problems obtaining medications due to transportation or finances?   no  Understanding of regimen: fair Understanding of indications: fair Potential of compliance: fair    Pharmacist Intervention: Spoke with patient on the phone regarding medications. Confirmed all of her medications after I read them out to patient. Did not have her medications with her and indicated her spouse helped to manage her medications. She did indicate she checks her blood pressure at home with her spouse and they have been in the 120s/70s. No issues with obtaining medications. Indicated it may be a good idea to bring her medications to her appointment with her. Reminded patient of her cardiac rehab appointment date and time.     Cephus Slater, PharmD, [facility] Resident [phone] 05/30/2021 10:01 AM

## 2021-05-31 ENCOUNTER — Other Ambulatory Visit: Payer: Self-pay

## 2021-05-31 ENCOUNTER — Telehealth: Payer: Self-pay | Admitting: Interventional Cardiology

## 2021-05-31 ENCOUNTER — Encounter (HOSPITAL_COMMUNITY)
Admission: RE | Admit: 2021-05-31 | Discharge: 2021-05-31 | Disposition: A | Discharge status: Home / Self Care | Payer: PPO | Source: Ambulatory Visit | Attending: Interventional Cardiology | Admitting: Interventional Cardiology

## 2021-05-31 ENCOUNTER — Encounter (HOSPITAL_COMMUNITY): Payer: Self-pay

## 2021-05-31 VITALS — BP 114/70 | HR 74 | Ht 64.25 in | Wt 157.4 lb

## 2021-05-31 DIAGNOSIS — I252 Old myocardial infarction: Secondary | ICD-10-CM | POA: Insufficient documentation

## 2021-05-31 DIAGNOSIS — I213 ST elevation (STEMI) myocardial infarction of unspecified site: Secondary | ICD-10-CM

## 2021-05-31 DIAGNOSIS — Z48812 Encounter for surgical aftercare following surgery on the circulatory system: Secondary | ICD-10-CM | POA: Insufficient documentation

## 2021-05-31 DIAGNOSIS — Z955 Presence of coronary angioplasty implant and graft: Secondary | ICD-10-CM

## 2021-05-31 HISTORY — DX: Atherosclerotic heart disease of native coronary artery without angina pectoris: I25.10

## 2021-05-31 MED ORDER — METOPROLOL TARTRATE 25 MG PO TABS: 25.0000 mg | ORAL_TABLET | Freq: Two times a day (BID) | ORAL | 3 refills | Status: DC

## 2021-05-31 NOTE — Progress Notes (Signed)
Cardiac Individual Treatment Plan  Patient Details  Name: Kim Brown MRN: [id number] Date of Birth: Apr 04, 1951 Referring Provider:   Flowsheet Row CARDIAC REHAB PHASE II ORIENTATION from 05/31/2021 in [place]  Referring Provider Jettie Booze, MD       Initial Encounter Date:  [place] PHASE II ORIENTATION from 05/31/2021 in [place]  Date 05/31/21       Visit Diagnosis: 03/20/21 STEMI  03/20/21 S/P DES x 2 Mid LAD, DES mLCFX x 1  Patient's Home Medications on Admission:  Current Outpatient Medications:    Ascorbic Acid (VITAMIN C) 1000 MG tablet, Take 1,000 mg by mouth 2 (two) times daily., Disp: , Rfl:    aspirin 81 MG EC tablet, Take 1 tablet (81 mg total) by mouth daily., Disp: 30 tablet, Rfl: 11   b complex vitamins tablet, Take 1 tablet by mouth 3 (three) times a week., Disp: , Rfl:    beta carotene 10000 UNIT capsule, Take 10,000 Units by mouth daily., Disp: , Rfl:    Calcium Carbonate-Vitamin D (CALTRATE 600+D PO), Take 1 tablet by mouth daily., Disp: , Rfl:    Cholecalciferol (VITAMIN D) 2000 UNITS CAPS, Take 1 capsule by mouth daily., Disp: , Rfl:    clopidogrel (PLAVIX) 75 MG tablet, Take 1 tablet (75 mg total) by mouth daily. Take 600 mg (8 tablets) tonight as a loading dose, then 75 mg daily (1 tablet), Disp: 90 tablet, Rfl: 3   DHEA 25 MG CAPS, Take 25 mg by mouth daily., Disp: , Rfl:    fish oil-omega-3 fatty acids 1000 MG capsule, Take 1 g by mouth daily., Disp: , Rfl:    Magnesium 250 MG TABS, Take 250 mg by mouth daily., Disp: , Rfl:    Melatonin 10 MG TABS, Take 10 mg by mouth at bedtime. , Disp: , Rfl:    nitroGLYCERIN (NITROSTAT) 0.4 MG SL tablet, Place 1 tablet (0.4 mg total) under the tongue every 5 (five) minutes as needed for chest pain., Disp: 25 tablet, Rfl: 3   phenylephrine (SUDAFED PE) 10 MG TABS tablet, Take 10 mg by mouth every 4 (four) hours as needed  (sinus)., Disp: , Rfl:    Polyethyl Glycol-Propyl Glycol (SYSTANE) 0.4-0.3 % SOLN, Place 1 drop into both eyes 2 (two) times daily., Disp: , Rfl:    progesterone (PROMETRIUM) 100 MG capsule, Take 300 mg by mouth at bedtime. , Disp: , Rfl:    thyroid (ARMOUR) 60 MG tablet, Take one tablet in am and one tablet in pm., Disp: 60 tablet, Rfl: 0   zinc gluconate 50 MG tablet, Take 50 mg by mouth daily., Disp: , Rfl:    pantoprazole (PROTONIX) 40 MG tablet, Take 1 tablet (40 mg total) by mouth daily. (Patient not taking: Reported on 05/31/2021), Disp: 30 tablet, Rfl: 0   rosuvastatin (CRESTOR) 40 MG tablet, Take 1 tablet (40 mg total) by mouth daily. (Patient not taking: Reported on 05/31/2021), Disp: 90 tablet, Rfl: 3  Past Medical History: Past Medical History:  Diagnosis Date   Allergy    Anemia    Arthritis    feet and hands , neck, shoulder   Coronary artery disease    Dysrhythmia 2006   atrial flutter   Exogenous obesity    Heel spur    Hyperlipidemia    Hypertension    Hypothyroid    Irregular heart beats    Ischemic cardiomyopathy    Palpitations  Prolapsed, anus    uses mag oxide powder nightly    RSD (reflex sympathetic dystrophy)    after surgery on R foot   STEMI (ST elevation myocardial infarction) ([place])    Stress incontinence, female     Tobacco Use: Social History   Tobacco Use  Smoking Status Never  Smokeless Tobacco Never    Labs: Recent Review Flowsheet Data     Labs for ITP Cardiac and Pulmonary Rehab Latest Ref Rng & Units 04/24/2018 09/05/2019 11/22/2020 03/20/2021 04/17/2021   Cholestrol 0 - 200 mg/dL 225(H) 250(H) 253(H) 240(H) 156   LDLCALC 0 - 99 mg/dL 148(H) - 176(H) 163(H) 89   LDLDIRECT mg/dL - 184.0 - - -   HDL >40 mg/dL 37.10(L) 36.40(L) 39.80 44 41   Trlycerides <150 mg/dL 198.0(H) 251.0(H) 188.0(H) 167(H) 132   Hemoglobin A1c 4.8 - 5.6 % - - - 5.4 -   TCO2 22 - 32 mmol/L - - - 21(L) -       Capillary Blood Glucose: Lab Results  Component  Value Date   GLUCAP 96 03/29/2021   GLUCAP 101 (H) 03/28/2021   GLUCAP 120 (H) 03/20/2021     Exercise Target Goals: Exercise Program Goal: Individual exercise prescription set using results from initial 6 min walk test and THRR while considering  patient's activity barriers and safety.   Exercise Prescription Goal: Starting with aerobic activity 30 plus minutes a day, 3 days per week for initial exercise prescription. Provide home exercise prescription and guidelines that participant acknowledges understanding prior to discharge.  Activity Barriers & Risk Stratification:  Activity Barriers & Cardiac Risk Stratification - 05/31/21 1336       Activity Barriers & Cardiac Risk Stratification   Activity Barriers Other (comment)    Comments Previous bunion surgery, right toe. Right shoulder replacement. Left arm pain recently.    Cardiac Risk Stratification High             6 Minute Walk:  6 Minute Walk     Row Name 05/31/21 1347         6 Minute Walk   Phase Initial     Distance 1270 feet     Walk Time 6 minutes     # of Rest Breaks 0     MPH 2.4     METS 2.83     RPE 13     Perceived Dyspnea  2     VO2 Peak 9.89     Symptoms Yes (comment)     Comments Patient c/o mild SOB, 2/4 on dyspnea scale, and right hip and knee pain, 2/10 on pain scale.     Resting HR 74 bpm     Resting BP 114/70     Resting Oxygen Saturation  98 %     Exercise Oxygen Saturation  during 6 min walk 98 %     Max Ex. HR 99 bpm     Max Ex. BP 124/82     2 Minute Post BP 118/78              Oxygen Initial Assessment:   Oxygen Re-Evaluation:   Oxygen Discharge (Final Oxygen Re-Evaluation):   Initial Exercise Prescription:  Initial Exercise Prescription - 05/31/21 1500       Date of Initial Exercise RX and Referring Provider   Date 05/31/21    Referring Provider Jettie Booze, MD    Expected Discharge Date 07/29/21      NuStep   Level 2  SPM 85    Minutes 25     METs 2      Prescription Details   Frequency (times per week) 3    Duration Progress to 30 minutes of continuous aerobic without signs/symptoms of physical distress      Intensity   THRR 40-80% of Max Heartrate 60-121    Ratings of Perceived Exertion 11-13    Perceived Dyspnea 0-4      Progression   Progression Continue to progress workloads to maintain intensity without signs/symptoms of physical distress.      Resistance Training   Training Prescription Yes    Weight 2 lbs    Reps 10-15             Perform Capillary Blood Glucose checks as needed.  Exercise Prescription Changes:   Exercise Comments:   Exercise Goals and Review:   Exercise Goals     Row Name 05/31/21 1319             Exercise Goals   Increase Physical Activity Yes       Intervention Provide advice, education, support and counseling about physical activity/exercise needs.;Develop an individualized exercise prescription for aerobic and resistive training based on initial evaluation findings, risk stratification, comorbidities and participant's personal goals.       Expected Outcomes Short Term: Attend rehab on a regular basis to increase amount of physical activity.;Long Term: Exercising regularly at least 3-5 days a week.;Long Term: Add in home exercise to make exercise part of routine and to increase amount of physical activity.       Increase Strength and Stamina Yes       Intervention Provide advice, education, support and counseling about physical activity/exercise needs.;Develop an individualized exercise prescription for aerobic and resistive training based on initial evaluation findings, risk stratification, comorbidities and participant's personal goals.       Expected Outcomes Short Term: Increase workloads from initial exercise prescription for resistance, speed, and METs.;Short Term: Perform resistance training exercises routinely during rehab and add in resistance training at home;Long  Term: Improve cardiorespiratory fitness, muscular endurance and strength as measured by increased METs and functional capacity (6MWT)       Able to understand and use rate of perceived exertion (RPE) scale Yes       Intervention Provide education and explanation on how to use RPE scale       Expected Outcomes Short Term: Able to use RPE daily in rehab to express subjective intensity level;Long Term:  Able to use RPE to guide intensity level when exercising independently       Knowledge and understanding of Target Heart Rate Range (THRR) Yes       Intervention Provide education and explanation of THRR including how the numbers were predicted and where they are located for reference       Expected Outcomes Short Term: Able to state/look up THRR;Long Term: Able to use THRR to govern intensity when exercising independently;Short Term: Able to use daily as guideline for intensity in rehab       Able to check pulse independently Yes       Intervention Provide education and demonstration on how to check pulse in carotid and radial arteries.;Review the importance of being able to check your own pulse for safety during independent exercise       Expected Outcomes Short Term: Able to explain why pulse checking is important during independent exercise;Long Term: Able to check pulse independently and accurately  Understanding of Exercise Prescription Yes       Intervention Provide education, explanation, and written materials on patient's individual exercise prescription       Expected Outcomes Short Term: Able to explain program exercise prescription;Long Term: Able to explain home exercise prescription to exercise independently                Exercise Goals Re-Evaluation :    Discharge Exercise Prescription (Final Exercise Prescription Changes):   Nutrition:  Target Goals: Understanding of nutrition guidelines, daily intake of sodium 1500mg , cholesterol 200mg , calories 30% from fat and 7% or  less from saturated fats, daily to have 5 or more servings of fruits and vegetables.  Biometrics:  Pre Biometrics - 05/31/21 1317       Pre Biometrics   Waist Circumference 37 inches    Hip Circumference 42.25 inches    Waist to Hip Ratio 0.88 %    Triceps Skinfold 24 mm    % Body Fat 38.5 %    Grip Strength 26 kg    Flexibility 11.8 in    Single Leg Stand 30 seconds              Nutrition Therapy Plan and Nutrition Goals:   Nutrition Assessments:  MEDIFICTS Score Key: ?70 Need to make dietary changes  40-70 Heart Healthy Diet ? 40 Therapeutic Level Cholesterol Diet   Picture Your Plate Scores: <38 Unhealthy dietary pattern with much room for improvement. 41-50 Dietary pattern unlikely to meet recommendations for good health and room for improvement. 51-60 More healthful dietary pattern, with some room for improvement.  >60 Healthy dietary pattern, although there may be some specific behaviors that could be improved.    Nutrition Goals Re-Evaluation:   Nutrition Goals Discharge (Final Nutrition Goals Re-Evaluation):   Psychosocial: Target Goals: Acknowledge presence or absence of significant depression and/or stress, maximize coping skills, provide positive support system. Participant is able to verbalize types and ability to use techniques and skills needed for reducing stress and depression.  Initial Review & Psychosocial Screening:  Initial Psych Review & Screening - 05/31/21 1344       Initial Review   Current issues with Current Stress Concerns    Source of Stress Concerns Chronic Illness;Unable to participate in former interests or hobbies;Unable to perform yard/household activities    Comments Kim Brown has been concerned about her health since her hospitalization in April.      Family Dynamics   Good Support System? Yes   Kim Brown has her husband of 78 years and 3 children for support     Barriers   Psychosocial barriers to participate in program The  patient should benefit from training in stress management and relaxation.      Screening Interventions   Interventions To provide support and resources with identified psychosocial needs;Encouraged to exercise    Expected Outcomes Long Term Goal: Stressors or current issues are controlled or eliminated.;Short Term goal: Identification and review with participant of any Quality of Life or Depression concerns found by scoring the questionnaire.;Long Term goal: The participant improves quality of Life and PHQ9 Scores as seen by post scores and/or verbalization of changes             Quality of Life Scores:  Quality of Life - 05/31/21 1519       Quality of Life   Select Quality of Life      Quality of Life Scores   Health/Function Pre 21.58 %    Socioeconomic Pre  20.93 %    Psych/Spiritual Pre 24.5 %    Family Pre 27.3 %    GLOBAL Pre 23.02 %            Scores of 19 and below usually indicate a poorer quality of life in these areas.  A difference of  2-3 points is a clinically meaningful difference.  A difference of 2-3 points in the total score of the Quality of Life Index has been associated with significant improvement in overall quality of life, self-image, physical symptoms, and general health in studies assessing change in quality of life.  PHQ-9: Recent Review Flowsheet Data     Depression screen [facility] 2/9 05/31/2021 11/30/2020 11/26/2020 09/05/2019 04/24/2018   Decreased Interest 0 0 0 0 0   Down, Depressed, Hopeless 0 0 0 0 0   PHQ - 2 Score 0 0 0 0 0   Altered sleeping - 0 0 0 0   Tired, decreased energy - 0 0 0 0   Change in appetite - 0 0 0 0   Feeling bad or failure about yourself  - 0 0 0 0   Trouble concentrating - 0 0 0 0   Moving slowly or fidgety/restless - 0 0 0 0   Suicidal thoughts - 0 0 0 0   PHQ-9 Score - 0 0 0 0   Difficult doing work/chores - Not difficult at all Not difficult at all Not difficult at all Not difficult at all      Interpretation of  Total Score  Total Score Depression Severity:  1-4 = Minimal depression, 5-9 = Mild depression, 10-14 = Moderate depression, 15-19 = Moderately severe depression, 20-27 = Severe depression   Psychosocial Evaluation and Intervention:   Psychosocial Re-Evaluation:   Psychosocial Discharge (Final Psychosocial Re-Evaluation):   Vocational Rehabilitation: Provide vocational rehab assistance to qualifying candidates.   Vocational Rehab Evaluation & Intervention:  Vocational Rehab - 05/31/21 1539       Initial Vocational Rehab Evaluation & Intervention   Assessment shows need for Vocational Rehabilitation No   Riely is retired and does not need vocational rehab at this time.            Education: Education Goals: Education classes will be provided on a weekly basis, covering required topics. Participant will state understanding/return demonstration of topics presented.  Learning Barriers/Preferences:  Learning Barriers/Preferences - 05/31/21 1552       Learning Barriers/Preferences   Learning Barriers Exercise Concerns   Dizziness when standing up too fast   Learning Preferences Verbal Instruction;Audio;Computer/Internet;Pictoral             Education Topics: Hypertension, Hypertension Reduction -Define heart disease and high blood pressure. Discus how high blood pressure affects the body and ways to reduce high blood pressure.   Exercise and Your Heart -Discuss why it is important to exercise, the FITT principles of exercise, normal and abnormal responses to exercise, and how to exercise safely.   Angina -Discuss definition of angina, causes of angina, treatment of angina, and how to decrease risk of having angina.   Cardiac Medications -Review what the following cardiac medications are used for, how they affect the body, and side effects that may occur when taking the medications.  Medications include Aspirin, Beta blockers, calcium channel blockers, ACE  Inhibitors, angiotensin receptor blockers, diuretics, digoxin, and antihyperlipidemics.   Congestive Heart Failure -Discuss the definition of CHF, how to live with CHF, the signs and symptoms of CHF, and how keep track of  weight and sodium intake.   Heart Disease and Intimacy -Discus the effect sexual activity has on the heart, how changes occur during intimacy as we age, and safety during sexual activity.   Smoking Cessation / COPD -Discuss different methods to quit smoking, the health benefits of quitting smoking, and the definition of COPD.   Nutrition I: Fats -Discuss the types of cholesterol, what cholesterol does to the heart, and how cholesterol levels can be controlled.   Nutrition II: Labels -Discuss the different components of food labels and how to read food label   Heart Parts/Heart Disease and PAD -Discuss the anatomy of the heart, the pathway of blood circulation through the heart, and these are affected by heart disease.   Stress I: Signs and Symptoms -Discuss the causes of stress, how stress may lead to anxiety and depression, and ways to limit stress.   Stress II: Relaxation -Discuss different types of relaxation techniques to limit stress.   Warning Signs of Stroke / TIA -Discuss definition of a stroke, what the signs and symptoms are of a stroke, and how to identify when someone is having stroke.   Knowledge Questionnaire Score:  Knowledge Questionnaire Score - 05/31/21 1536       Knowledge Questionnaire Score   Pre Score 22/24             Core Components/Risk Factors/Patient Goals at Admission:  Personal Goals and Risk Factors at Admission - 05/31/21 1443       Core Components/Risk Factors/Patient Goals on Admission    Weight Management Yes;Obesity    Intervention Weight Management/Obesity: Establish reasonable short term and long term weight goals.;Obesity: Provide education and appropriate resources to help participant work on and attain  dietary goals.    Admit Weight 157 lb 6.5 oz (71.4 kg)    Expected Outcomes Short Term: Continue to assess and modify interventions until short term weight is achieved;Long Term: Adherence to nutrition and physical activity/exercise program aimed toward attainment of established weight goal;Weight Loss: Understanding of general recommendations for a balanced deficit meal plan, which promotes 1-2 lb weight loss per week and includes a negative energy balance of [phone] kcal/d    Hypertension Yes    Intervention Provide education on lifestyle modifcations including regular physical activity/exercise, weight management, moderate sodium restriction and increased consumption of fresh fruit, vegetables, and low fat dairy, alcohol moderation, and smoking cessation.;Monitor prescription use compliance.    Expected Outcomes Short Term: Continued assessment and intervention until BP is < 140/31mm HG in hypertensive participants. < 130/64mm HG in hypertensive participants with diabetes, heart failure or chronic kidney disease.;Long Term: Maintenance of blood pressure at goal levels.    Lipids Yes    Intervention Provide education and support for participant on nutrition & aerobic/resistive exercise along with prescribed medications to achieve LDL 70mg , HDL >40mg .    Expected Outcomes Short Term: Participant states understanding of desired cholesterol values and is compliant with medications prescribed. Participant is following exercise prescription and nutrition guidelines.;Long Term: Cholesterol controlled with medications as prescribed, with individualized exercise RX and with personalized nutrition plan. Value goals: LDL < 70mg , HDL > 40 mg.    Stress Yes    Intervention Offer individual and/or small group education and counseling on adjustment to heart disease, stress management and health-related lifestyle change. Teach and support self-help strategies.;Refer participants experiencing significant psychosocial  distress to appropriate mental health specialists for further evaluation and treatment. When possible, include family members and significant others in education/counseling sessions.  Expected Outcomes Short Term: Participant demonstrates changes in health-related behavior, relaxation and other stress management skills, ability to obtain effective social support, and compliance with psychotropic medications if prescribed.;Long Term: Emotional wellbeing is indicated by absence of clinically significant psychosocial distress or social isolation.    Personal Goal Other Yes    Personal Goal Improve balance. Build strength in right shoulder.    Intervention Incorporate balance training exercises 2 days/week in addition to stretching and resistance training to help increase balance and strength.    Expected Outcomes Patient will maintain consistent exercise routine including balance exercises and resistance training.             Core Components/Risk Factors/Patient Goals Review:    Core Components/Risk Factors/Patient Goals at Discharge (Final Review):    ITP Comments:  ITP Comments     Row Name 05/31/21 1318           ITP Comments Medical Director- Dr. Fransico Him, MD                Comments: Kim Brown attended orientation on 05/31/2021 to review rules and guidelines for program.  Completed 6 minute walk test, Intitial ITP, and exercise prescription.  VSS. Telemetry-Sinus Rhythm. Kim Brown did report having mild 2/4 shortness of breath and 2/10 Right hip and knee pain this resolved with rest.  Safety measures and social distancing in place per CDC guidelines. Barnet Pall, RN,BSN 05/31/2021 3:56 PM

## 2021-05-31 NOTE — Telephone Encounter (Signed)
Spoke with pt and made her aware that Dr. Irish Lack said ok to continue Metoprolol.  Pt appreciative for call.

## 2021-05-31 NOTE — Progress Notes (Addendum)
Patient here for cardiac rehab orientation. Upon review of medication. Patient and husband reports that she is taking 12.5 of metoprolol in the morning and 25 mg of metoprolol at night. Patient and her husband reported that she started taking  the beta blocker 2 weeks ago because her blood pressure started going up. Metoprolol is not listed on her current medication list and is also listed  as an allergy that causes tongue swelling. Vital signs are as follows. Blood pressure pre walk test BP 114/70 heart rate 78. Patient completed 6 minute walk test without difficulty. Dr Hebert Soho office called and notified. Asked Triage to call the patient to discuss.Barnet Pall, RN,BSN 05/31/2021 2:46 PM

## 2021-05-31 NOTE — Telephone Encounter (Signed)
Pt states she was having some reactions to medications previously, so several were stopped and she was told to restart one at a time.  States this is now under control and she restarted Metoprolol Tartrate 25mg  BID about 2 weeks ago.  Has not had issues since restarting.  BPs are 117-120/70s-80s, HRs 60s.  Advised I will send to Dr. Irish Lack to make him aware and make sure he wanted her to stay on this medication.  Pt appreciative for call.

## 2021-05-31 NOTE — Telephone Encounter (Signed)
Pt c/o medication issue: 1. Name of Medication: Metoprolol  2. How are you currently taking this medication (dosage and times per day)? 12.5 morning  3. Are you having a reaction (difficulty breathing--STAT)?  No  4. What is your medication issue? Patient stating she is back on this medication and would like to know if that is ok. If any question please call Verdis Frederickson at [phone] at [facility]. Please cal patient first.

## 2021-06-06 ENCOUNTER — Encounter (HOSPITAL_COMMUNITY)
Admission: RE | Admit: 2021-06-06 | Discharge: 2021-06-06 | Disposition: A | Discharge status: Home / Self Care | Payer: PPO | Source: Ambulatory Visit | Attending: Interventional Cardiology | Admitting: Interventional Cardiology

## 2021-06-06 ENCOUNTER — Other Ambulatory Visit: Payer: Self-pay

## 2021-06-06 DIAGNOSIS — Z955 Presence of coronary angioplasty implant and graft: Secondary | ICD-10-CM

## 2021-06-06 DIAGNOSIS — I213 ST elevation (STEMI) myocardial infarction of unspecified site: Secondary | ICD-10-CM

## 2021-06-06 DIAGNOSIS — Z48812 Encounter for surgical aftercare following surgery on the circulatory system: Secondary | ICD-10-CM | POA: Diagnosis not present

## 2021-06-06 DIAGNOSIS — I252 Old myocardial infarction: Secondary | ICD-10-CM | POA: Diagnosis not present

## 2021-06-06 NOTE — Progress Notes (Signed)
Cardiac Individual Treatment Plan  Patient Details  Name: Kim Brown MRN: [id number] Date of Birth: September 17, 1951 Referring Provider:   Flowsheet Row CARDIAC REHAB PHASE II ORIENTATION from 05/31/2021 in [place]  Referring Provider Jettie Booze, MD       Initial Encounter Date:  [place] [place] [facility] PHASE II ORIENTATION from 05/31/2021 in [place]  Date 05/31/21       Visit Diagnosis: 03/20/21 STEMI  03/20/21 S/P DES x 2 Mid LAD, DES mLCFX x 1  Patient's Home Medications on Admission:  Current Outpatient Medications:    Ascorbic Acid (VITAMIN C) 1000 MG tablet, Take 1,000 mg by mouth 2 (two) times daily., Disp: , Rfl:    aspirin 81 MG EC tablet, Take 1 tablet (81 mg total) by mouth daily., Disp: 30 tablet, Rfl: 11   b complex vitamins tablet, Take 1 tablet by mouth 3 (three) times a week., Disp: , Rfl:    beta carotene 10000 UNIT capsule, Take 10,000 Units by mouth daily., Disp: , Rfl:    Calcium Carbonate-Vitamin D (CALTRATE 600+D PO), Take 1 tablet by mouth daily., Disp: , Rfl:    Cholecalciferol (VITAMIN D) 2000 UNITS CAPS, Take 1 capsule by mouth daily., Disp: , Rfl:    clopidogrel (PLAVIX) 75 MG tablet, Take 1 tablet (75 mg total) by mouth daily. Take 600 mg (8 tablets) tonight as a loading dose, then 75 mg daily (1 tablet), Disp: 90 tablet, Rfl: 3   DHEA 25 MG CAPS, Take 25 mg by mouth daily., Disp: , Rfl:    fish oil-omega-3 fatty acids 1000 MG capsule, Take 1 g by mouth daily., Disp: , Rfl:    Magnesium 250 MG TABS, Take 250 mg by mouth daily., Disp: , Rfl:    Melatonin 10 MG TABS, Take 10 mg by mouth at bedtime. , Disp: , Rfl:    metoprolol tartrate (LOPRESSOR) 25 MG tablet, Take 1 tablet (25 mg total) by mouth 2 (two) times daily., Disp: 180 tablet, Rfl: 3   nitroGLYCERIN (NITROSTAT) 0.4 MG SL tablet, Place 1 tablet (0.4 mg total) under the tongue every 5 (five) minutes as needed for chest  pain., Disp: 25 tablet, Rfl: 3   pantoprazole (PROTONIX) 40 MG tablet, Take 1 tablet (40 mg total) by mouth daily. (Patient not taking: Reported on 05/31/2021), Disp: 30 tablet, Rfl: 0   phenylephrine (SUDAFED PE) 10 MG TABS tablet, Take 10 mg by mouth every 4 (four) hours as needed (sinus)., Disp: , Rfl:    Polyethyl Glycol-Propyl Glycol (SYSTANE) 0.4-0.3 % SOLN, Place 1 drop into both eyes 2 (two) times daily., Disp: , Rfl:    progesterone (PROMETRIUM) 100 MG capsule, Take 300 mg by mouth at bedtime. , Disp: , Rfl:    rosuvastatin (CRESTOR) 40 MG tablet, Take 1 tablet (40 mg total) by mouth daily. (Patient not taking: Reported on 05/31/2021), Disp: 90 tablet, Rfl: 3   thyroid (ARMOUR) 60 MG tablet, Take one tablet in am and one tablet in pm., Disp: 60 tablet, Rfl: 0   zinc gluconate 50 MG tablet, Take 50 mg by mouth daily., Disp: , Rfl:   Past Medical History: Past Medical History:  Diagnosis Date   Allergy    Anemia    Arthritis    feet and hands , neck, shoulder   Coronary artery disease    Dysrhythmia 2006   atrial flutter   Exogenous obesity    Heel spur  Hyperlipidemia    Hypertension    Hypothyroid    Irregular heart beats    Ischemic cardiomyopathy    Palpitations    Prolapsed, anus    uses mag oxide powder nightly    RSD (reflex sympathetic dystrophy)    after surgery on R foot   STEMI (ST elevation myocardial infarction) ([place])    Stress incontinence, female     Tobacco Use: Social History   Tobacco Use  Smoking Status Never  Smokeless Tobacco Never    Labs: Recent Review Flowsheet Data     Labs for ITP Cardiac and Pulmonary Rehab Latest Ref Rng & Units 04/24/2018 09/05/2019 11/22/2020 03/20/2021 04/17/2021   Cholestrol 0 - 200 mg/dL 225(H) 250(H) 253(H) 240(H) 156   LDLCALC 0 - 99 mg/dL 148(H) - 176(H) 163(H) 89   LDLDIRECT mg/dL - 184.0 - - -   HDL >40 mg/dL 37.10(L) 36.40(L) 39.80 44 41   Trlycerides <150 mg/dL 198.0(H) 251.0(H) 188.0(H) 167(H) 132    Hemoglobin A1c 4.8 - 5.6 % - - - 5.4 -   TCO2 22 - 32 mmol/L - - - 21(L) -       Capillary Blood Glucose: Lab Results  Component Value Date   GLUCAP 96 03/29/2021   GLUCAP 101 (H) 03/28/2021   GLUCAP 120 (H) 03/20/2021     Exercise Target Goals: Exercise Program Goal: Individual exercise prescription set using results from initial 6 min walk test and THRR while considering  patient's activity barriers and safety.   Exercise Prescription Goal: Initial exercise prescription builds to 30-45 minutes a day of aerobic activity, 2-3 days per week.  Home exercise guidelines will be given to patient during program as part of exercise prescription that the participant will acknowledge.  Activity Barriers & Risk Stratification:  Activity Barriers & Cardiac Risk Stratification - 05/31/21 1336       Activity Barriers & Cardiac Risk Stratification   Activity Barriers Other (comment)    Comments Previous bunion surgery, right toe. Right shoulder replacement. Left arm pain recently.    Cardiac Risk Stratification High             6 Minute Walk:  6 Minute Walk     Row Name 05/31/21 1347         6 Minute Walk   Phase Initial     Distance 1270 feet     Walk Time 6 minutes     # of Rest Breaks 0     MPH 2.4     METS 2.83     RPE 13     Perceived Dyspnea  2     VO2 Peak 9.89     Symptoms Yes (comment)     Comments Patient c/o mild SOB, 2/4 on dyspnea scale, and right hip and knee pain, 2/10 on pain scale.     Resting HR 74 bpm     Resting BP 114/70     Resting Oxygen Saturation  98 %     Exercise Oxygen Saturation  during 6 min walk 98 %     Max Ex. HR 99 bpm     Max Ex. BP 124/82     2 Minute Post BP 118/78              Oxygen Initial Assessment:   Oxygen Re-Evaluation:   Oxygen Discharge (Final Oxygen Re-Evaluation):   Initial Exercise Prescription:  Initial Exercise Prescription - 05/31/21 1500       Date of Initial Exercise  RX and Referring Provider    Date 05/31/21    Referring Provider Jettie Booze, MD    Expected Discharge Date 07/29/21      NuStep   Level 2    SPM 85    Minutes 25    METs 2      Prescription Details   Frequency (times per week) 3    Duration Progress to 30 minutes of continuous aerobic without signs/symptoms of physical distress      Intensity   THRR 40-80% of Max Heartrate 60-121    Ratings of Perceived Exertion 11-13    Perceived Dyspnea 0-4      Progression   Progression Continue to progress workloads to maintain intensity without signs/symptoms of physical distress.      Resistance Training   Training Prescription Yes    Weight 2 lbs    Reps 10-15             Perform Capillary Blood Glucose checks as needed.  Exercise Prescription Changes:   Exercise Prescription Changes     Row Name 06/06/21 1325             Response to Exercise   Blood Pressure (Admit) 130/70       Blood Pressure (Exercise) 118/72       Blood Pressure (Exit) 104/60       Heart Rate (Admit) 87 bpm       Heart Rate (Exercise) 95 bpm       Heart Rate (Exit) 83 bpm       Rating of Perceived Exertion (Exercise) 9       Symptoms none       Comments Off to a good start with exercise.       Duration Progress to 30 minutes of  aerobic without signs/symptoms of physical distress       Intensity THRR unchanged               Progression     Progression Continue to progress workloads to maintain intensity without signs/symptoms of physical distress.       Average METs 2.4               Resistance Training     Training Prescription Yes       Weight 2 lbs       Reps 10-15       Time 10 Minutes               Interval Training     Interval Training No               NuStep     Level 2       SPM 85       Minutes 25       METs 2.4               Exercise Comments:   Exercise Comments     Row Name 06/06/21 1406           Exercise Comments Patient tolerated low intensity exercise well  without symptoms.                Exercise Goals and Review:   Exercise Goals     Row Name 05/31/21 1319             Exercise Goals   Increase Physical Activity Yes       Intervention Provide advice, education, support and counseling about physical activity/exercise needs.;Develop an  individualized exercise prescription for aerobic and resistive training based on initial evaluation findings, risk stratification, comorbidities and participant's personal goals.       Expected Outcomes Short Term: Attend rehab on a regular basis to increase amount of physical activity.;Long Term: Exercising regularly at least 3-5 days a week.;Long Term: Add in home exercise to make exercise part of routine and to increase amount of physical activity.       Increase Strength and Stamina Yes       Intervention Provide advice, education, support and counseling about physical activity/exercise needs.;Develop an individualized exercise prescription for aerobic and resistive training based on initial evaluation findings, risk stratification, comorbidities and participant's personal goals.       Expected Outcomes Short Term: Increase workloads from initial exercise prescription for resistance, speed, and METs.;Short Term: Perform resistance training exercises routinely during rehab and add in resistance training at home;Long Term: Improve cardiorespiratory fitness, muscular endurance and strength as measured by increased METs and functional capacity (6MWT)       Able to understand and use rate of perceived exertion (RPE) scale Yes       Intervention Provide education and explanation on how to use RPE scale       Expected Outcomes Short Term: Able to use RPE daily in rehab to express subjective intensity level;Long Term:  Able to use RPE to guide intensity level when exercising independently       Knowledge and understanding of Target Heart Rate Range (THRR) Yes       Intervention Provide education and explanation  of THRR including how the numbers were predicted and where they are located for reference       Expected Outcomes Short Term: Able to state/look up THRR;Long Term: Able to use THRR to govern intensity when exercising independently;Short Term: Able to use daily as guideline for intensity in rehab       Able to check pulse independently Yes       Intervention Provide education and demonstration on how to check pulse in carotid and radial arteries.;Review the importance of being able to check your own pulse for safety during independent exercise       Expected Outcomes Short Term: Able to explain why pulse checking is important during independent exercise;Long Term: Able to check pulse independently and accurately       Understanding of Exercise Prescription Yes       Intervention Provide education, explanation, and written materials on patient's individual exercise prescription       Expected Outcomes Short Term: Able to explain program exercise prescription;Long Term: Able to explain home exercise prescription to exercise independently                Exercise Goals Re-Evaluation :  Exercise Goals Re-Evaluation     Row Name 06/06/21 1406             Exercise Goal Re-Evaluation   Exercise Goals Review Increase Physical Activity;Able to understand and use rate of perceived exertion (RPE) scale       Comments Patient able to understand and use RPE scale appropriately.       Expected Outcomes Progress workloads as tolerated to help achieve personal health and fitness goals.                Discharge Exercise Prescription (Final Exercise Prescription Changes):  Exercise Prescription Changes - 06/06/21 1325       Response to Exercise   Blood Pressure (Admit) 130/70  Blood Pressure (Exercise) 118/72    Blood Pressure (Exit) 104/60    Heart Rate (Admit) 87 bpm    Heart Rate (Exercise) 95 bpm    Heart Rate (Exit) 83 bpm    Rating of Perceived Exertion (Exercise) 9    Symptoms  none    Comments Off to a good start with exercise.    Duration Progress to 30 minutes of  aerobic without signs/symptoms of physical distress    Intensity THRR unchanged      Progression   Progression Continue to progress workloads to maintain intensity without signs/symptoms of physical distress.    Average METs 2.4      Resistance Training   Training Prescription Yes    Weight 2 lbs    Reps 10-15    Time 10 Minutes      Interval Training   Interval Training No      NuStep   Level 2    SPM 85    Minutes 25    METs 2.4             Nutrition:  Target Goals: Understanding of nutrition guidelines, daily intake of sodium 1500mg , cholesterol 200mg , calories 30% from fat and 7% or less from saturated fats, daily to have 5 or more servings of fruits and vegetables.  Biometrics:  Pre Biometrics - 05/31/21 1317       Pre Biometrics   Waist Circumference 37 inches    Hip Circumference 42.25 inches    Waist to Hip Ratio 0.88 %    Triceps Skinfold 24 mm    % Body Fat 38.5 %    Grip Strength 26 kg    Flexibility 11.8 in    Single Leg Stand 30 seconds              Nutrition Therapy Plan and Nutrition Goals:   Nutrition Assessments:  MEDIFICTS Score Key: ?70 Need to make dietary changes  40-70 Heart Healthy Diet ? 40 Therapeutic Level Cholesterol Diet    Picture Your Plate Scores: <79 Unhealthy dietary pattern with much room for improvement. 41-50 Dietary pattern unlikely to meet recommendations for good health and room for improvement. 51-60 More healthful dietary pattern, with some room for improvement.  >60 Healthy dietary pattern, although there may be some specific behaviors that could be improved.    Nutrition Goals Re-Evaluation:   Nutrition Goals Re-Evaluation:   Nutrition Goals Discharge (Final Nutrition Goals Re-Evaluation):   Psychosocial: Target Goals: Acknowledge presence or absence of significant depression and/or stress, maximize  coping skills, provide positive support system. Participant is able to verbalize types and ability to use techniques and skills needed for reducing stress and depression.  Initial Review & Psychosocial Screening:  Initial Psych Review & Screening - 05/31/21 1344       Initial Review   Current issues with Current Stress Concerns    Source of Stress Concerns Chronic Illness;Unable to participate in former interests or hobbies;Unable to perform yard/household activities    Comments Kim Brown has been concerned about her health since her hospitalization in April.      Family Dynamics   Good Support System? Yes   Kim Brown has her husband of 38 years and 3 children for support     Barriers   Psychosocial barriers to participate in program The patient should benefit from training in stress management and relaxation.      Screening Interventions   Interventions To provide support and resources with identified psychosocial needs;Encouraged to exercise  Expected Outcomes Long Term Goal: Stressors or current issues are controlled or eliminated.;Short Term goal: Identification and review with participant of any Quality of Life or Depression concerns found by scoring the questionnaire.;Long Term goal: The participant improves quality of Life and PHQ9 Scores as seen by post scores and/or verbalization of changes             Quality of Life Scores:  Quality of Life - 05/31/21 1519       Quality of Life   Select Quality of Life      Quality of Life Scores   Health/Function Pre 21.58 %    Socioeconomic Pre 20.93 %    Psych/Spiritual Pre 24.5 %    Family Pre 27.3 %    GLOBAL Pre 23.02 %            Scores of 19 and below usually indicate a poorer quality of life in these areas.  A difference of  2-3 points is a clinically meaningful difference.  A difference of 2-3 points in the total score of the Quality of Life Index has been associated with significant improvement in overall quality of life,  self-image, physical symptoms, and general health in studies assessing change in quality of life.  PHQ-9: Recent Review Flowsheet Data     Depression screen [facility] 2/9 05/31/2021 11/30/2020 11/26/2020 09/05/2019 04/24/2018   Decreased Interest 0 0 0 0 0   Down, Depressed, Hopeless 0 0 0 0 0   PHQ - 2 Score 0 0 0 0 0   Altered sleeping - 0 0 0 0   Tired, decreased energy - 0 0 0 0   Change in appetite - 0 0 0 0   Feeling bad or failure about yourself  - 0 0 0 0   Trouble concentrating - 0 0 0 0   Moving slowly or fidgety/restless - 0 0 0 0   Suicidal thoughts - 0 0 0 0   PHQ-9 Score - 0 0 0 0   Difficult doing work/chores - Not difficult at all Not difficult at all Not difficult at all Not difficult at all      Interpretation of Total Score  Total Score Depression Severity:  1-4 = Minimal depression, 5-9 = Mild depression, 10-14 = Moderate depression, 15-19 = Moderately severe depression, 20-27 = Severe depression   Psychosocial Evaluation and Intervention:   Psychosocial Re-Evaluation:  Psychosocial Re-Evaluation     Row Name 06/06/21 1659             Psychosocial Re-Evaluation   Current issues with Current Stress Concerns       Comments 30 Day ITP Review. Kim Brown started exercise and did well for her fitness level on 06/06/21.Will continue to monitor and offer support as needed       Expected Outcomes Kim Brown will have decreased stress upon completion of phase 2 cardiac rehab.       Interventions Encouraged to attend Cardiac Rehabilitation for the exercise               Initial Review     Source of Stress Concerns Unable to perform yard/household activities;Unable to participate in former interests or hobbies;Chronic Illness               Psychosocial Discharge (Final Psychosocial Re-Evaluation):  Psychosocial Re-Evaluation - 06/06/21 1659       Psychosocial Re-Evaluation   Current issues with Current Stress Concerns    Comments 30 Day ITP Review. Kim Brown started exercise  and did well for her fitness level on 06/06/21.Will continue to monitor and offer support as needed    Expected Outcomes Kim Brown will have decreased stress upon completion of phase 2 cardiac rehab.    Interventions Encouraged to attend Cardiac Rehabilitation for the exercise      Initial Review   Source of Stress Concerns Unable to perform yard/household activities;Unable to participate in former interests or hobbies;Chronic Illness             Vocational Rehabilitation: Provide vocational rehab assistance to qualifying candidates.   Vocational Rehab Evaluation & Intervention:  Vocational Rehab - 05/31/21 1539       Initial Vocational Rehab Evaluation & Intervention   Assessment shows need for Vocational Rehabilitation No   Andreia is retired and does not need vocational rehab at this time.            Education: Education Goals: Education classes will be provided on a weekly basis, covering required topics. Participant will state understanding/return demonstration of topics presented.  Learning Barriers/Preferences:  Learning Barriers/Preferences - 05/31/21 1552       Learning Barriers/Preferences   Learning Barriers Exercise Concerns   Dizziness when standing up too fast   Learning Preferences Verbal Instruction;Audio;Computer/Internet;Pictoral             Education Topics: Count Your Pulse:  -Group instruction provided by verbal instruction, demonstration, patient participation and written materials to support subject.  Instructors address importance of being able to find your pulse and how to count your pulse when at home without a heart monitor.  Patients get hands on experience counting their pulse with staff help and individually.   Heart Attack, Angina, and Risk Factor Modification:  -Group instruction provided by verbal instruction, video, and written materials to support subject.  Instructors address signs and symptoms of angina and heart attacks.    Also  discuss risk factors for heart disease and how to make changes to improve heart health risk factors.   Functional Fitness:  -Group instruction provided by verbal instruction, demonstration, patient participation, and written materials to support subject.  Instructors address safety measures for doing things around the house.  Discuss how to get up and down off the floor, how to pick things up properly, how to safely get out of a chair without assistance, and balance training.   Meditation and Mindfulness:  -Group instruction provided by verbal instruction, patient participation, and written materials to support subject.  Instructor addresses importance of mindfulness and meditation practice to help reduce stress and improve awareness.  Instructor also leads participants through a meditation exercise.    Stretching for Flexibility and Mobility:  -Group instruction provided by verbal instruction, patient participation, and written materials to support subject.  Instructors lead participants through series of stretches that are designed to increase flexibility thus improving mobility.  These stretches are additional exercise for major muscle groups that are typically performed during regular warm up and cool down.   Hands Only CPR:  -Group verbal, video, and participation provides a basic overview of AHA guidelines for community CPR. Role-play of emergencies allow participants the opportunity to practice calling for help and chest compression technique with discussion of AED use.   Hypertension: -Group verbal and written instruction that provides a basic overview of hypertension including the most recent diagnostic guidelines, risk factor reduction with self-care instructions and medication management.    Nutrition I class: Heart Healthy Eating:  -Group instruction provided by PowerPoint slides, verbal discussion, and written materials to  support subject matter. The instructor gives an  explanation and review of the Therapeutic Lifestyle Changes diet recommendations, which includes a discussion on lipid goals, dietary fat, sodium, fiber, plant stanol/sterol esters, sugar, and the components of a well-balanced, healthy diet.   Nutrition II class: Lifestyle Skills:  -Group instruction provided by PowerPoint slides, verbal discussion, and written materials to support subject matter. The instructor gives an explanation and review of label reading, grocery shopping for heart health, heart healthy recipe modifications, and ways to make healthier choices when eating out.   Diabetes Question & Answer:  -Group instruction provided by PowerPoint slides, verbal discussion, and written materials to support subject matter. The instructor gives an explanation and review of diabetes co-morbidities, pre- and post-prandial blood glucose goals, pre-exercise blood glucose goals, signs, symptoms, and treatment of hypoglycemia and hyperglycemia, and foot care basics.   Diabetes Blitz:  -Group instruction provided by PowerPoint slides, verbal discussion, and written materials to support subject matter. The instructor gives an explanation and review of the physiology behind type 1 and type 2 diabetes, diabetes medications and rational behind using different medications, pre- and post-prandial blood glucose recommendations and Hemoglobin A1c goals, diabetes diet, and exercise including blood glucose guidelines for exercising safely.    Portion Distortion:  -Group instruction provided by PowerPoint slides, verbal discussion, written materials, and food models to support subject matter. The instructor gives an explanation of serving size versus portion size, changes in portions sizes over the last 20 years, and what consists of a serving from each food group.   Stress Management:  -Group instruction provided by verbal instruction, video, and written materials to support subject matter.  Instructors  review role of stress in heart disease and how to cope with stress positively.     Exercising on Your Own:  -Group instruction provided by verbal instruction, power point, and written materials to support subject.  Instructors discuss benefits of exercise, components of exercise, frequency and intensity of exercise, and end points for exercise.  Also discuss use of nitroglycerin and activating EMS.  Review options of places to exercise outside of rehab.  Review guidelines for sex with heart disease.   Cardiac Drugs I:  -Group instruction provided by verbal instruction and written materials to support subject.  Instructor reviews cardiac drug classes: antiplatelets, anticoagulants, beta blockers, and statins.  Instructor discusses reasons, side effects, and lifestyle considerations for each drug class.   Cardiac Drugs II:  -Group instruction provided by verbal instruction and written materials to support subject.  Instructor reviews cardiac drug classes: angiotensin converting enzyme inhibitors (ACE-I), angiotensin II receptor blockers (ARBs), nitrates, and calcium channel blockers.  Instructor discusses reasons, side effects, and lifestyle considerations for each drug class.   Anatomy and Physiology of the Circulatory System:  Group verbal and written instruction and models provide basic cardiac anatomy and physiology, with the coronary electrical and arterial systems. Review of: AMI, Angina, Valve disease, Heart Failure, Peripheral Artery Disease, Cardiac Arrhythmia, Pacemakers, and the ICD.   Other Education:  -Group or individual verbal, written, or video instructions that support the educational goals of the cardiac rehab program.   Holiday Eating Survival Tips:  -Group instruction provided by PowerPoint slides, verbal discussion, and written materials to support subject matter. The instructor gives patients tips, tricks, and techniques to help them not only survive but enjoy the holidays  despite the onslaught of food that accompanies the holidays.   Knowledge Questionnaire Score:  Knowledge Questionnaire Score - 05/31/21 1536  Knowledge Questionnaire Score   Pre Score 22/24             Core Components/Risk Factors/Patient Goals at Admission:  Personal Goals and Risk Factors at Admission - 05/31/21 1443       Core Components/Risk Factors/Patient Goals on Admission    Weight Management Yes;Obesity    Intervention Weight Management/Obesity: Establish reasonable short term and long term weight goals.;Obesity: Provide education and appropriate resources to help participant work on and attain dietary goals.    Admit Weight 157 lb 6.5 oz (71.4 kg)    Expected Outcomes Short Term: Continue to assess and modify interventions until short term weight is achieved;Long Term: Adherence to nutrition and physical activity/exercise program aimed toward attainment of established weight goal;Weight Loss: Understanding of general recommendations for a balanced deficit meal plan, which promotes 1-2 lb weight loss per week and includes a negative energy balance of [id number] kcal/d    Hypertension Yes    Intervention Provide education on lifestyle modifcations including regular physical activity/exercise, weight management, moderate sodium restriction and increased consumption of fresh fruit, vegetables, and low fat dairy, alcohol moderation, and smoking cessation.;Monitor prescription use compliance.    Expected Outcomes Short Term: Continued assessment and intervention until BP is < 140/16mm HG in hypertensive participants. < 130/62mm HG in hypertensive participants with diabetes, heart failure or chronic kidney disease.;Long Term: Maintenance of blood pressure at goal levels.    Lipids Yes    Intervention Provide education and support for participant on nutrition & aerobic/resistive exercise along with prescribed medications to achieve LDL 70mg , HDL >40mg .    Expected Outcomes Short  Term: Participant states understanding of desired cholesterol values and is compliant with medications prescribed. Participant is following exercise prescription and nutrition guidelines.;Long Term: Cholesterol controlled with medications as prescribed, with individualized exercise RX and with personalized nutrition plan. Value goals: LDL < 70mg , HDL > 40 mg.    Stress Yes    Intervention Offer individual and/or small group education and counseling on adjustment to heart disease, stress management and health-related lifestyle change. Teach and support self-help strategies.;Refer participants experiencing significant psychosocial distress to appropriate mental health specialists for further evaluation and treatment. When possible, include family members and significant others in education/counseling sessions.    Expected Outcomes Short Term: Participant demonstrates changes in health-related behavior, relaxation and other stress management skills, ability to obtain effective social support, and compliance with psychotropic medications if prescribed.;Long Term: Emotional wellbeing is indicated by absence of clinically significant psychosocial distress or social isolation.    Personal Goal Other Yes    Personal Goal Improve balance. Build strength in right shoulder.    Intervention Incorporate balance training exercises 2 days/week in addition to stretching and resistance training to help increase balance and strength.    Expected Outcomes Patient will maintain consistent exercise routine including balance exercises and resistance training.             Core Components/Risk Factors/Patient Goals Review:   Goals and Risk Factor Review     Row Name 06/06/21 1706             Core Components/Risk Factors/Patient Goals Review   Personal Goals Review Weight Management/Obesity;Lipids;Stress;Hypertension       Review Kim Brown started exercise on 06/06/21 and did well with exercise.Vital signs were stable        Expected Outcomes Kim Brown will continue to participate in phase 2 cardiac rehab for exercise, nutrition and lifestyle modifications  Core Components/Risk Factors/Patient Goals at Discharge (Final Review):   Goals and Risk Factor Review - 06/06/21 1706       Core Components/Risk Factors/Patient Goals Review   Personal Goals Review Weight Management/Obesity;Lipids;Stress;Hypertension    Review Kim Brown started exercise on 06/06/21 and did well with exercise.Vital signs were stable    Expected Outcomes Kim Brown will continue to participate in phase 2 cardiac rehab for exercise, nutrition and lifestyle modifications             ITP Comments:  ITP Comments     Row Name 05/31/21 1318 06/06/21 1658         ITP Comments Medical Director- Dr. Fransico Him, MD 30 Day ITP Review. Kim Brown started exercise on 06/06/21 and did well with exercise.               Comments: See ITP comments.Barnet Pall, RN,BSN 06/07/2021 1:58 PM

## 2021-06-06 NOTE — Progress Notes (Signed)
Daily Session Note  Patient Details  Name: Kim Brown MRN: [id number] Date of Birth: 04/01/1951 Referring Provider:   Flowsheet Row CARDIAC REHAB PHASE II ORIENTATION from 05/31/2021 in [place]  Referring Provider Jettie Booze, MD       Encounter Date: 06/06/2021  Check In:  Session Check In - 06/06/21 1324       Check-In   Supervising physician immediately available to respond to emergencies [facility] immediately available    Physician(s) Dr. Arbutus Ped    Location [facility]    Staff Present Seward Carol, MS, ACSM CEP, Exercise Physiologist;Marbin Olshefski Venetia Maxon, RN, BSN;Ramon Dredge, RN, MHA;Jessica Hassell Done, MS, ACSM-CEP, Exercise Physiologist;Jetta Gilford Rile BS, ACSM EP-C, Exercise Physiologist    Virtual Visit No    Medication changes reported     No    Fall or balance concerns reported    No    Tobacco Cessation No Change    Warm-up and Cool-down Performed on first and last piece of equipment   Cardiac Rehab Orientation   Resistance Training Performed Yes    VAD Patient? No    PAD/SET Patient? No      Pain Assessment   Currently in Pain? No/denies    Pain Score 0-No pain    Multiple Pain Sites No             Capillary Blood Glucose: No results found for this or any previous visit (from the past 24 hour(s)).   Exercise Prescription Changes - 06/06/21 1325       Response to Exercise   Blood Pressure (Admit) 130/70    Blood Pressure (Exercise) 118/72    Blood Pressure (Exit) 104/60    Heart Rate (Admit) 87 bpm    Heart Rate (Exercise) 95 bpm    Heart Rate (Exit) 83 bpm    Rating of Perceived Exertion (Exercise) 9    Symptoms none    Comments Off to a good start with exercise.    Duration Progress to 30 minutes of  aerobic without signs/symptoms of physical distress    Intensity THRR unchanged      Progression   Progression Continue to progress workloads to maintain intensity without  signs/symptoms of physical distress.    Average METs 2.4      Resistance Training   Training Prescription Yes    Weight 2 lbs    Reps 10-15    Time 10 Minutes      Interval Training   Interval Training No      NuStep   Level 2    SPM 85    Minutes 25    METs 2.4             Social History   Tobacco Use  Smoking Status Never  Smokeless Tobacco Never    Goals Met:  Exercise tolerated well No report of cardiac concerns or symptoms Strength training completed today  Goals Unmet:  Not Applicable  Comments: Kim Brown started cardiac rehab today.  Pt tolerated light exercise without difficulty. VSS, telemetry-Sinus Rhythm, asymptomatic.  Medication list reconciled. Pt denies barriers to medicaiton compliance.  PSYCHOSOCIAL ASSESSMENT:  PHQ-0. Pt exhibits positive coping skills, hopeful outlook with supportive family. No psychosocial needs identified at this time, no psychosocial interventions necessary.    Pt enjoys caring for her grandchildren and playing with her dogs   Pt oriented to exercise equipment and routine.    Understanding verbalized. Kim Brown did limited exercise with her right shoulder as  she had shoulder surgery in 2021.Kim Pall, RN,BSN 06/06/2021 5:09 PM    Dr. Fransico Him is Medical Director for Cardiac Rehab at [facility].

## 2021-06-08 ENCOUNTER — Other Ambulatory Visit: Payer: Self-pay

## 2021-06-08 ENCOUNTER — Encounter (HOSPITAL_COMMUNITY)
Admission: RE | Admit: 2021-06-08 | Discharge: 2021-06-08 | Disposition: A | Discharge status: Home / Self Care | Payer: PPO | Source: Ambulatory Visit | Attending: Interventional Cardiology | Admitting: Interventional Cardiology

## 2021-06-08 DIAGNOSIS — Z48812 Encounter for surgical aftercare following surgery on the circulatory system: Secondary | ICD-10-CM | POA: Diagnosis not present

## 2021-06-08 DIAGNOSIS — I213 ST elevation (STEMI) myocardial infarction of unspecified site: Secondary | ICD-10-CM

## 2021-06-08 DIAGNOSIS — Z955 Presence of coronary angioplasty implant and graft: Secondary | ICD-10-CM

## 2021-06-10 ENCOUNTER — Encounter (HOSPITAL_COMMUNITY): Payer: PPO

## 2021-06-13 ENCOUNTER — Other Ambulatory Visit: Payer: Self-pay

## 2021-06-13 ENCOUNTER — Encounter (HOSPITAL_COMMUNITY)
Admission: RE | Admit: 2021-06-13 | Discharge: 2021-06-13 | Disposition: A | Discharge status: Home / Self Care | Payer: PPO | Source: Ambulatory Visit | Attending: Interventional Cardiology | Admitting: Interventional Cardiology

## 2021-06-13 DIAGNOSIS — Z48812 Encounter for surgical aftercare following surgery on the circulatory system: Secondary | ICD-10-CM | POA: Diagnosis not present

## 2021-06-13 DIAGNOSIS — I213 ST elevation (STEMI) myocardial infarction of unspecified site: Secondary | ICD-10-CM

## 2021-06-13 DIAGNOSIS — Z955 Presence of coronary angioplasty implant and graft: Secondary | ICD-10-CM

## 2021-06-13 NOTE — Progress Notes (Signed)
Kim Brown 70 y.o. female Nutrition Note  Diagnosis: STEMI, DESx2 mLAD, DESx1 mLCFX  Past Medical History:  Diagnosis Date   Allergy    Anemia    Arthritis    feet and hands , neck, shoulder   Coronary artery disease    Dysrhythmia 2006   atrial flutter   Exogenous obesity    Heel spur    Hyperlipidemia    Hypertension    Hypothyroid    Irregular heart beats    Ischemic cardiomyopathy    Palpitations    Prolapsed, anus    uses mag oxide powder nightly    RSD (reflex sympathetic dystrophy)    after surgery on R foot   STEMI (ST elevation myocardial infarction) ([place])    Stress incontinence, female      Medications reviewed.   Current Outpatient Medications:    Ascorbic Acid (VITAMIN C) 1000 MG tablet, Take 1,000 mg by mouth 2 (two) times daily., Disp: , Rfl:    aspirin 81 MG EC tablet, Take 1 tablet (81 mg total) by mouth daily., Disp: 30 tablet, Rfl: 11   b complex vitamins tablet, Take 1 tablet by mouth 3 (three) times a week., Disp: , Rfl:    beta carotene 10000 UNIT capsule, Take 10,000 Units by mouth daily., Disp: , Rfl:    Calcium Carbonate-Vitamin D (CALTRATE 600+D PO), Take 1 tablet by mouth daily., Disp: , Rfl:    Cholecalciferol (VITAMIN D) 2000 UNITS CAPS, Take 1 capsule by mouth daily., Disp: , Rfl:    clopidogrel (PLAVIX) 75 MG tablet, Take 1 tablet (75 mg total) by mouth daily. Take 600 mg (8 tablets) tonight as a loading dose, then 75 mg daily (1 tablet), Disp: 90 tablet, Rfl: 3   DHEA 25 MG CAPS, Take 25 mg by mouth daily., Disp: , Rfl:    fish oil-omega-3 fatty acids 1000 MG capsule, Take 1 g by mouth daily., Disp: , Rfl:    Magnesium 250 MG TABS, Take 250 mg by mouth daily., Disp: , Rfl:    Melatonin 10 MG TABS, Take 10 mg by mouth at bedtime. , Disp: , Rfl:    metoprolol tartrate (LOPRESSOR) 25 MG tablet, Take 1 tablet (25 mg total) by mouth 2 (two) times daily., Disp: 180 tablet, Rfl: 3   nitroGLYCERIN (NITROSTAT) 0.4 MG SL tablet, Place 1 tablet  (0.4 mg total) under the tongue every 5 (five) minutes as needed for chest pain., Disp: 25 tablet, Rfl: 3   pantoprazole (PROTONIX) 40 MG tablet, Take 1 tablet (40 mg total) by mouth daily. (Patient not taking: Reported on 05/31/2021), Disp: 30 tablet, Rfl: 0   phenylephrine (SUDAFED PE) 10 MG TABS tablet, Take 10 mg by mouth every 4 (four) hours as needed (sinus)., Disp: , Rfl:    Polyethyl Glycol-Propyl Glycol (SYSTANE) 0.4-0.3 % SOLN, Place 1 drop into both eyes 2 (two) times daily., Disp: , Rfl:    progesterone (PROMETRIUM) 100 MG capsule, Take 300 mg by mouth at bedtime. , Disp: , Rfl:    rosuvastatin (CRESTOR) 40 MG tablet, Take 1 tablet (40 mg total) by mouth daily. (Patient not taking: Reported on 05/31/2021), Disp: 90 tablet, Rfl: 3   thyroid (ARMOUR) 60 MG tablet, Take one tablet in am and one tablet in pm., Disp: 60 tablet, Rfl: 0   zinc gluconate 50 MG tablet, Take 50 mg by mouth daily., Disp: , Rfl:    Ht Readings from Last 1 Encounters:  05/31/21 5' 4.25" (1.632 m)  Wt Readings from Last 3 Encounters:  05/31/21 157 lb 6.5 oz (71.4 kg)  04/17/21 159 lb (72.1 kg)  03/31/21 158 lb (71.7 kg)     There is no height or weight on file to calculate BMI.   Social History   Tobacco Use  Smoking Status Never  Smokeless Tobacco Never     Lab Results  Component Value Date   CHOL 156 04/17/2021   Lab Results  Component Value Date   HDL 41 04/17/2021   Lab Results  Component Value Date   LDLCALC 89 04/17/2021   Lab Results  Component Value Date   TRIG 132 04/17/2021     Lab Results  Component Value Date   HGBA1C 5.4 03/20/2021     CBG (last 3)  No results for input(s): GLUCAP in the last 72 hours.     Nutrition Note  Spoke with pt. Nutrition Plan and Nutrition Survey goals reviewed with pt. Pt is following a Heart Healthy diet.  Reviewed lipid panel with pt.  Pt drinks 4 cups lightly sweet tea daily. She reports allergies/intolerances to artificial  sweeteners. Low saturated fat intake. We reviewed incorporating polyunsaturated fats into her diet.  She does not like to cook fish but she can eat canned tuna. She cooks in olive oil and eats 1-2 avocados per week. Estimated intake of 16 g fiber daily per diet recall. We discussed benefit of soluble fiber.  Pt does not eat plant stanol or sterol fortified foods. Per discussion, pt does not use canned/convenience foods often. Pt does not add salt to food. Pt does not eat out frequently.   Pt expressed understanding of the information reviewed.    Nutrition Diagnosis Inadequate soluble fiber intake related to lack of previous education about dietary fiber as evidenced by estimated intake 16 g per day   Nutrition Intervention Pt's individual nutrition plan reviewed with pt. Benefits of adopting Heart Healthy diet discussed when Picture Your Plate reviewed.  Continue client-centered nutrition education by RD, as part of interdisciplinary care.  Goal(s)  Pt to build a healthy plate including vegetables, fruits, whole grains, and low-fat dairy products in a heart healthy meal plan. Pt to include 2-3 servings fish per week Pt to eat 5-20 g soluble fiber daily  Plan:  Will provide client-centered nutrition education as part of interdisciplinary care Monitor and evaluate progress toward nutrition goal with team.   Michaele Offer, MS, RDN, LDN

## 2021-06-15 ENCOUNTER — Encounter (HOSPITAL_COMMUNITY)
Admission: RE | Admit: 2021-06-15 | Discharge: 2021-06-15 | Disposition: A | Discharge status: Home / Self Care | Payer: PPO | Source: Ambulatory Visit | Attending: Interventional Cardiology | Admitting: Interventional Cardiology

## 2021-06-15 ENCOUNTER — Other Ambulatory Visit: Payer: Self-pay

## 2021-06-15 DIAGNOSIS — Z48812 Encounter for surgical aftercare following surgery on the circulatory system: Secondary | ICD-10-CM | POA: Diagnosis not present

## 2021-06-15 DIAGNOSIS — I213 ST elevation (STEMI) myocardial infarction of unspecified site: Secondary | ICD-10-CM

## 2021-06-15 DIAGNOSIS — Z955 Presence of coronary angioplasty implant and graft: Secondary | ICD-10-CM

## 2021-06-17 ENCOUNTER — Encounter (HOSPITAL_COMMUNITY): Payer: PPO

## 2021-06-22 ENCOUNTER — Encounter (HOSPITAL_COMMUNITY)
Admission: RE | Admit: 2021-06-22 | Discharge: 2021-06-22 | Disposition: A | Discharge status: Home / Self Care | Payer: PPO | Source: Ambulatory Visit | Attending: Interventional Cardiology | Admitting: Interventional Cardiology

## 2021-06-22 ENCOUNTER — Other Ambulatory Visit: Payer: Self-pay

## 2021-06-22 DIAGNOSIS — Z955 Presence of coronary angioplasty implant and graft: Secondary | ICD-10-CM | POA: Diagnosis not present

## 2021-06-22 DIAGNOSIS — I213 ST elevation (STEMI) myocardial infarction of unspecified site: Secondary | ICD-10-CM | POA: Diagnosis not present

## 2021-06-24 ENCOUNTER — Encounter (HOSPITAL_COMMUNITY): Payer: PPO

## 2021-06-27 ENCOUNTER — Encounter (HOSPITAL_COMMUNITY): Payer: PPO

## 2021-06-27 ENCOUNTER — Telehealth: Payer: Self-pay | Admitting: Interventional Cardiology

## 2021-06-27 DIAGNOSIS — R5383 Other fatigue: Secondary | ICD-10-CM

## 2021-06-27 DIAGNOSIS — I25118 Atherosclerotic heart disease of native coronary artery with other forms of angina pectoris: Secondary | ICD-10-CM

## 2021-06-27 NOTE — Telephone Encounter (Signed)
Pt is calling in to check if she needs a antibiotic after getting her teeth cleaned.

## 2021-06-27 NOTE — Telephone Encounter (Signed)
Spoke with pt husband ok per DPR.  He reports pt had a dental cleaning today.  Dentist office gave pt an antibiotic prior to dental work.  Husband wants to know if she needs an order to have antibiotics after dental work.  I informed him that if antibiotics are needed they are given prior to dental work.  I will route to Dr. Irish Lack to address.

## 2021-06-29 ENCOUNTER — Encounter (HOSPITAL_COMMUNITY)
Admission: RE | Admit: 2021-06-29 | Discharge: 2021-06-29 | Disposition: A | Discharge status: Home / Self Care | Payer: PPO | Source: Ambulatory Visit | Attending: Interventional Cardiology | Admitting: Interventional Cardiology

## 2021-06-29 ENCOUNTER — Other Ambulatory Visit: Payer: Self-pay

## 2021-06-29 DIAGNOSIS — I213 ST elevation (STEMI) myocardial infarction of unspecified site: Secondary | ICD-10-CM

## 2021-06-29 DIAGNOSIS — Z955 Presence of coronary angioplasty implant and graft: Secondary | ICD-10-CM

## 2021-07-01 ENCOUNTER — Encounter (HOSPITAL_COMMUNITY): Payer: PPO

## 2021-07-01 NOTE — Telephone Encounter (Signed)
She does not have any valvular heart disease.  No need for antibiotics with dental procedures.   Jettie Booze, MD

## 2021-07-04 ENCOUNTER — Other Ambulatory Visit: Payer: Self-pay

## 2021-07-04 ENCOUNTER — Encounter (HOSPITAL_COMMUNITY)
Admission: RE | Admit: 2021-07-04 | Discharge: 2021-07-04 | Disposition: A | Discharge status: Home / Self Care | Payer: PPO | Source: Ambulatory Visit | Attending: Interventional Cardiology | Admitting: Interventional Cardiology

## 2021-07-04 DIAGNOSIS — I213 ST elevation (STEMI) myocardial infarction of unspecified site: Secondary | ICD-10-CM | POA: Diagnosis not present

## 2021-07-04 DIAGNOSIS — Z955 Presence of coronary angioplasty implant and graft: Secondary | ICD-10-CM

## 2021-07-04 NOTE — Addendum Note (Signed)
Addended by: Loren Racer on: 07/04/2021 09:41 AM   Modules accepted: Orders

## 2021-07-04 NOTE — Telephone Encounter (Signed)
Would set her up for echo given her persistent fatigue and recent MI/CAD. Will need to verify if EF has recovered.     JV  ----- Message -----  From: Stanton Kidney, RN  Sent: 07/04/2021   9:11 AM EDT  To: Jettie Booze, MD   Spoke with Kim Brown and made her aware of recommendations.  Kim Brown agreeable to plan.

## 2021-07-04 NOTE — Telephone Encounter (Signed)
Husband made aware that pt does NOT need abx prior to dental procedures from a cardiology standpoint. He verbalized understanding.  Husband reports he does not feel pt is progressing the way she should. Reports pt as lethargic, many days she is just worn out. She is going to rehab, but doesn't seemed to have progressed that far. States this has been an ongoing issue and we even tried changing her medications around but no improvement. By the time she gets home on rehab days, she is ready to go to bed. Her lack of energy is the biggest concern. BPs 136/78, 136/81, 126/72 and 139/76. HRs avg 60s. Husband wonders if the Metoprolol is one the culprits. Pt is currently taking 12.5 mg in the a.m. and 25 mg in the p.m. Advised it could be an issue, also advised to discuss abn TSH w/ PCP. Offered pt appt w/ Dr. Irish Lack this Thursday but husband declined stating they had another appt that day. Nothing else to offer in the next month/two.  Pt added to waitlist in case opening comes open. Advised to call office if pt worsens and/or they can make appt this week. Husband appreciates help/return call. Forwarding  to MD for his [facility]

## 2021-07-05 NOTE — Progress Notes (Signed)
Reviewed home exercise Rx with patient today. Pt voices waking 15-20 minutes daily. Encouraged to continue and try to increase the time to 30 minutes. Encouraged warm-up, cool-down and stretching. Reviewed THRR of 60-121 and keeping the RPE between 11-13. Encouraged hydration. Discussed weather parameters for temperature and humidity for safe exercise outdoors. Reviewed S/S to terminate exercise and when to call MD vs 911. Reviewed use of NTG. Pt verbalized understanding of the home exercise Rx and was provided a copy.   Lesly Rubenstein MS, ACSM-CEP, CCRP

## 2021-07-06 ENCOUNTER — Other Ambulatory Visit: Payer: Self-pay

## 2021-07-06 ENCOUNTER — Encounter (HOSPITAL_COMMUNITY)
Admission: RE | Admit: 2021-07-06 | Discharge: 2021-07-06 | Disposition: A | Discharge status: Home / Self Care | Payer: PPO | Source: Ambulatory Visit | Attending: Interventional Cardiology | Admitting: Interventional Cardiology

## 2021-07-06 DIAGNOSIS — I213 ST elevation (STEMI) myocardial infarction of unspecified site: Secondary | ICD-10-CM

## 2021-07-06 DIAGNOSIS — Z955 Presence of coronary angioplasty implant and graft: Secondary | ICD-10-CM

## 2021-07-07 ENCOUNTER — Ambulatory Visit: Payer: PPO | Admitting: Interventional Cardiology

## 2021-07-07 NOTE — Progress Notes (Signed)
Cardiac Individual Treatment Plan  Patient Details  Name: Kim Brown MRN: [id number] Date of Birth: January 31, 1951 Referring Provider:   Flowsheet Row CARDIAC REHAB PHASE II ORIENTATION from 05/31/2021 in [place]  Referring Provider Jettie Booze, MD       Initial Encounter Date:  [place] PHASE II ORIENTATION from 05/31/2021 in [place]  Date 05/31/21       Visit Diagnosis: 03/20/21 STEMI  03/20/21 S/P DES x 2 Mid LAD, DES mLCFX x 1  Patient's Home Medications on Admission:  Current Outpatient Medications:    Ascorbic Acid (VITAMIN C) 1000 MG tablet, Take 1,000 mg by mouth 2 (two) times daily., Disp: , Rfl:    aspirin 81 MG EC tablet, Take 1 tablet (81 mg total) by mouth daily., Disp: 30 tablet, Rfl: 11   b complex vitamins tablet, Take 1 tablet by mouth 3 (three) times a week., Disp: , Rfl:    beta carotene 10000 UNIT capsule, Take 10,000 Units by mouth daily., Disp: , Rfl:    Calcium Carbonate-Vitamin D (CALTRATE 600+D PO), Take 1 tablet by mouth daily., Disp: , Rfl:    Cholecalciferol (VITAMIN D) 2000 UNITS CAPS, Take 1 capsule by mouth daily., Disp: , Rfl:    clopidogrel (PLAVIX) 75 MG tablet, Take 1 tablet (75 mg total) by mouth daily. Take 600 mg (8 tablets) tonight as a loading dose, then 75 mg daily (1 tablet), Disp: 90 tablet, Rfl: 3   DHEA 25 MG CAPS, Take 25 mg by mouth daily., Disp: , Rfl:    fish oil-omega-3 fatty acids 1000 MG capsule, Take 1 g by mouth daily., Disp: , Rfl:    Magnesium 250 MG TABS, Take 250 mg by mouth daily., Disp: , Rfl:    Melatonin 10 MG TABS, Take 10 mg by mouth at bedtime. , Disp: , Rfl:    metoprolol tartrate (LOPRESSOR) 25 MG tablet, Take 1 tablet (25 mg total) by mouth 2 (two) times daily., Disp: 180 tablet, Rfl: 3   nitroGLYCERIN (NITROSTAT) 0.4 MG SL tablet, Place 1 tablet (0.4 mg total) under the tongue every 5 (five) minutes as needed for chest  pain., Disp: 25 tablet, Rfl: 3   pantoprazole (PROTONIX) 40 MG tablet, Take 1 tablet (40 mg total) by mouth daily. (Patient not taking: Reported on 05/31/2021), Disp: 30 tablet, Rfl: 0   phenylephrine (SUDAFED PE) 10 MG TABS tablet, Take 10 mg by mouth every 4 (four) hours as needed (sinus)., Disp: , Rfl:    Polyethyl Glycol-Propyl Glycol (SYSTANE) 0.4-0.3 % SOLN, Place 1 drop into both eyes 2 (two) times daily., Disp: , Rfl:    progesterone (PROMETRIUM) 100 MG capsule, Take 300 mg by mouth at bedtime. , Disp: , Rfl:    rosuvastatin (CRESTOR) 40 MG tablet, Take 1 tablet (40 mg total) by mouth daily. (Patient not taking: Reported on 05/31/2021), Disp: 90 tablet, Rfl: 3   thyroid (ARMOUR) 60 MG tablet, Take one tablet in am and one tablet in pm., Disp: 60 tablet, Rfl: 0   zinc gluconate 50 MG tablet, Take 50 mg by mouth daily., Disp: , Rfl:   Past Medical History: Past Medical History:  Diagnosis Date   Allergy    Anemia    Arthritis    feet and hands , neck, shoulder   Coronary artery disease    Dysrhythmia 2006   atrial flutter   Exogenous obesity    Heel spur  Hyperlipidemia    Hypertension    Hypothyroid    Irregular heart beats    Ischemic cardiomyopathy    Palpitations    Prolapsed, anus    uses mag oxide powder nightly    RSD (reflex sympathetic dystrophy)    after surgery on R foot   STEMI (ST elevation myocardial infarction) ([place])    Stress incontinence, female     Tobacco Use: Social History   Tobacco Use  Smoking Status Never  Smokeless Tobacco Never    Labs: Recent Review Flowsheet Data     Labs for ITP Cardiac and Pulmonary Rehab Latest Ref Rng & Units 04/24/2018 09/05/2019 11/22/2020 03/20/2021 04/17/2021   Cholestrol 0 - 200 mg/dL 225(H) 250(H) 253(H) 240(H) 156   LDLCALC 0 - 99 mg/dL 148(H) - 176(H) 163(H) 89   LDLDIRECT mg/dL - 184.0 - - -   HDL >40 mg/dL 37.10(L) 36.40(L) 39.80 44 41   Trlycerides <150 mg/dL 198.0(H) 251.0(H) 188.0(H) 167(H) 132    Hemoglobin A1c 4.8 - 5.6 % - - - 5.4 -   TCO2 22 - 32 mmol/L - - - 21(L) -       Capillary Blood Glucose: Lab Results  Component Value Date   GLUCAP 96 03/29/2021   GLUCAP 101 (H) 03/28/2021   GLUCAP 120 (H) 03/20/2021     Exercise Target Goals: Exercise Program Goal: Individual exercise prescription set using results from initial 6 min walk test and THRR while considering  patient's activity barriers and safety.   Exercise Prescription Goal: Starting with aerobic activity 30 plus minutes a day, 3 days per week for initial exercise prescription. Provide home exercise prescription and guidelines that participant acknowledges understanding prior to discharge.  Activity Barriers & Risk Stratification:  Activity Barriers & Cardiac Risk Stratification - 05/31/21 1336       Activity Barriers & Cardiac Risk Stratification   Activity Barriers Other (comment)    Comments Previous bunion surgery, right toe. Right shoulder replacement. Left arm pain recently.    Cardiac Risk Stratification High             6 Minute Walk:  6 Minute Walk     Row Name 05/31/21 1347         6 Minute Walk   Phase Initial     Distance 1270 feet     Walk Time 6 minutes     # of Rest Breaks 0     MPH 2.4     METS 2.83     RPE 13     Perceived Dyspnea  2     VO2 Peak 9.89     Symptoms Yes (comment)     Comments Patient c/o mild SOB, 2/4 on dyspnea scale, and right hip and knee pain, 2/10 on pain scale.     Resting HR 74 bpm     Resting BP 114/70     Resting Oxygen Saturation  98 %     Exercise Oxygen Saturation  during 6 min walk 98 %     Max Ex. HR 99 bpm     Max Ex. BP 124/82     2 Minute Post BP 118/78              Oxygen Initial Assessment:   Oxygen Re-Evaluation:   Oxygen Discharge (Final Oxygen Re-Evaluation):   Initial Exercise Prescription:  Initial Exercise Prescription - 05/31/21 1500       Date of Initial Exercise RX and Referring Provider   Date  05/31/21     Referring Provider Jettie Booze, MD    Expected Discharge Date 07/29/21      NuStep   Level 2    SPM 85    Minutes 25    METs 2      Prescription Details   Frequency (times per week) 3    Duration Progress to 30 minutes of continuous aerobic without signs/symptoms of physical distress      Intensity   THRR 40-80% of Max Heartrate 60-121    Ratings of Perceived Exertion 11-13    Perceived Dyspnea 0-4      Progression   Progression Continue to progress workloads to maintain intensity without signs/symptoms of physical distress.      Resistance Training   Training Prescription Yes    Weight 2 lbs    Reps 10-15             Perform Capillary Blood Glucose checks as needed.  Exercise Prescription Changes:   Exercise Prescription Changes     Row Name 06/06/21 1325 06/22/21 1500 06/29/21 1430 07/04/21 1430       Response to Exercise   Blood Pressure (Admit) 130/70 122/60 94/60 108/54    Blood Pressure (Exercise) 118/72 124/70 122/70 120/60    Blood Pressure (Exit) 104/60 108/64 124/70 130/80    Heart Rate (Admit) 87 bpm 78 bpm 69 bpm 85 bpm    Heart Rate (Exercise) 95 bpm 89 bpm 91 bpm 92 bpm    Heart Rate (Exit) 83 bpm 79 bpm 69 bpm 76 bpm    Rating of Perceived Exertion (Exercise) 9 12 13 11     Symptoms none None None None    Comments Off to a good start with exercise. Reviewed METs Reviewed Home exercise Rx Reviewed METs and Goals    Duration Progress to 30 minutes of  aerobic without signs/symptoms of physical distress Progress to 30 minutes of  aerobic without signs/symptoms of physical distress Continue with 30 min of aerobic exercise without signs/symptoms of physical distress. Continue with 30 min of aerobic exercise without signs/symptoms of physical distress.    Intensity THRR unchanged THRR unchanged THRR unchanged THRR unchanged         Progression        Progression Continue to progress workloads to maintain intensity without signs/symptoms of  physical distress. Continue to progress workloads to maintain intensity without signs/symptoms of physical distress. Continue to progress workloads to maintain intensity without signs/symptoms of physical distress. Continue to progress workloads to maintain intensity without signs/symptoms of physical distress.    Average METs 2.4 2.2 2.1 2.4         Resistance Training        Training Prescription Yes No No Yes    Weight 2 lbs No weights on Wednesdays No weights on Wednesdays 2 lbs    Reps 10-15 -- -- 10-15    Time 10 Minutes -- -- 10 Minutes         Interval Training        Interval Training No No No No         NuStep        Level 2 2 2 2     SPM 85 85 85 90    Minutes 25 25 25 27     METs 2.4 2.2 2.1 2.4         Home Exercise Plan        Plans to continue exercise at -- -- Home (comment) Home (  comment)    Frequency -- -- Add 4 additional days to program exercise sessions. Add 4 additional days to program exercise sessions.    Initial Home Exercises Provided -- -- 06/29/21 06/29/21            Exercise Comments:   Exercise Comments     Row Name 06/06/21 1406 06/22/21 1500 06/29/21 1445 07/04/21 1440 07/05/21 0842   Exercise Comments Patient tolerated low intensity exercise well without symptoms. Reviewed METs with patient today. Continue to encourage patient to increase exercise workloads as she feels comfortable to do so. Reviewed home exercise Rx with patient today. She verbailzed understanding and was provided a copy of the HEP. Reviewed METs and goals. Pt voices that she is making some progress. Slow but steday progression noted. --            Exercise Goals and Review:   Exercise Goals     Row Name 05/31/21 1319             Exercise Goals   Increase Physical Activity Yes       Intervention Provide advice, education, support and counseling about physical activity/exercise needs.;Develop an individualized exercise prescription for aerobic and resistive  training based on initial evaluation findings, risk stratification, comorbidities and participant's personal goals.       Expected Outcomes Short Term: Attend rehab on a regular basis to increase amount of physical activity.;Long Term: Exercising regularly at least 3-5 days a week.;Long Term: Add in home exercise to make exercise part of routine and to increase amount of physical activity.       Increase Strength and Stamina Yes       Intervention Provide advice, education, support and counseling about physical activity/exercise needs.;Develop an individualized exercise prescription for aerobic and resistive training based on initial evaluation findings, risk stratification, comorbidities and participant's personal goals.       Expected Outcomes Short Term: Increase workloads from initial exercise prescription for resistance, speed, and METs.;Short Term: Perform resistance training exercises routinely during rehab and add in resistance training at home;Long Term: Improve cardiorespiratory fitness, muscular endurance and strength as measured by increased METs and functional capacity (6MWT)       Able to understand and use rate of perceived exertion (RPE) scale Yes       Intervention Provide education and explanation on how to use RPE scale       Expected Outcomes Short Term: Able to use RPE daily in rehab to express subjective intensity level;Long Term:  Able to use RPE to guide intensity level when exercising independently       Knowledge and understanding of Target Heart Rate Range (THRR) Yes       Intervention Provide education and explanation of THRR including how the numbers were predicted and where they are located for reference       Expected Outcomes Short Term: Able to state/look up THRR;Long Term: Able to use THRR to govern intensity when exercising independently;Short Term: Able to use daily as guideline for intensity in rehab       Able to check pulse independently Yes       Intervention  Provide education and demonstration on how to check pulse in carotid and radial arteries.;Review the importance of being able to check your own pulse for safety during independent exercise       Expected Outcomes Short Term: Able to explain why pulse checking is important during independent exercise;Long Term: Able to check pulse independently and accurately  Understanding of Exercise Prescription Yes       Intervention Provide education, explanation, and written materials on patient's individual exercise prescription       Expected Outcomes Short Term: Able to explain program exercise prescription;Long Term: Able to explain home exercise prescription to exercise independently                Exercise Goals Re-Evaluation :  Exercise Goals Re-Evaluation     [place] Name 06/06/21 1406 06/29/21 1445           Exercise Goal Re-Evaluation   Exercise Goals Review Increase Physical Activity;Able to understand and use rate of perceived exertion (RPE) scale Increase Physical Activity;Increase Strength and Stamina;Able to understand and use rate of perceived exertion (RPE) scale;Knowledge and understanding of Target Heart Rate Range (THRR);Able to check pulse independently;Understanding of Exercise Prescription      Comments Patient able to understand and use RPE scale appropriately. Reviewed home exercise Rx with patient today. Pt is walking daily 15-20 minutes in attion to the CRP2 program.      Expected Outcomes Progress workloads as tolerated to help achieve personal health and fitness goals. Pt will continue to walk at home.                Discharge Exercise Prescription (Final Exercise Prescription Changes):  Exercise Prescription Changes - 07/04/21 1430       Response to Exercise   Blood Pressure (Admit) 108/54    Blood Pressure (Exercise) 120/60    Blood Pressure (Exit) 130/80    Heart Rate (Admit) 85 bpm    Heart Rate (Exercise) 92 bpm    Heart Rate (Exit) 76 bpm    Rating  of Perceived Exertion (Exercise) 11    Symptoms None    Comments Reviewed METs and Goals    Duration Continue with 30 min of aerobic exercise without signs/symptoms of physical distress.    Intensity THRR unchanged      Progression   Progression Continue to progress workloads to maintain intensity without signs/symptoms of physical distress.    Average METs 2.4      Resistance Training   Training Prescription Yes    Weight 2 lbs    Reps 10-15    Time 10 Minutes      Interval Training   Interval Training No      NuStep   Level 2    SPM 90    Minutes 27    METs 2.4      Home Exercise Plan   Plans to continue exercise at Home (comment)    Frequency Add 4 additional days to program exercise sessions.    Initial Home Exercises Provided 06/29/21             Nutrition:  Target Goals: Understanding of nutrition guidelines, daily intake of sodium 1500mg , cholesterol 200mg , calories 30% from fat and 7% or less from saturated fats, daily to have 5 or more servings of fruits and vegetables.  Biometrics:  Pre Biometrics - 05/31/21 1317       Pre Biometrics   Waist Circumference 37 inches    Hip Circumference 42.25 inches    Waist to Hip Ratio 0.88 %    Triceps Skinfold 24 mm    % Body Fat 38.5 %    Grip Strength 26 kg    Flexibility 11.8 in    Single Leg Stand 30 seconds              Nutrition Therapy Plan  and Nutrition Goals:  Nutrition Therapy & Goals - 06/13/21 1418       Nutrition Therapy   Diet TLC      Personal Nutrition Goals   Nutrition Goal Pt to build a healthy plate including vegetables, fruits, whole grains, and low-fat dairy products in a heart healthy meal plan    Personal Goal #2 Pt to include 2-3 servings fish per week    Personal Goal #3 Pt to eat 5-20 g soluble fiber daily      Intervention Plan   Intervention Prescribe, educate and counsel regarding individualized specific dietary modifications aiming towards targeted core components  such as weight, hypertension, lipid management, diabetes, heart failure and other comorbidities.;Nutrition handout(s) given to patient.    Expected Outcomes Short Term Goal: Understand basic principles of dietary content, such as calories, fat, sodium, cholesterol and nutrients.;Long Term Goal: Adherence to prescribed nutrition plan.             Nutrition Assessments:  MEDIFICTS Score Key: ?70 Need to make dietary changes  40-70 Heart Healthy Diet ? 40 Therapeutic Level Cholesterol Diet  Flowsheet Row CARDIAC REHAB PHASE II EXERCISE from 06/13/2021 in [place]  Picture Your Plate Total Score on Admission 72      Picture Your Plate Scores: <78 Unhealthy dietary pattern with much room for improvement. 41-50 Dietary pattern unlikely to meet recommendations for good health and room for improvement. 51-60 More healthful dietary pattern, with some room for improvement.  >60 Healthy dietary pattern, although there may be some specific behaviors that could be improved.    Nutrition Goals Re-Evaluation:  Nutrition Goals Re-Evaluation     [place] Name 06/13/21 1418 07/04/21 1100           Goals   Current Weight 157 lb (71.2 kg) 158 lb 15.2 oz (72.1 kg)      Nutrition Goal -- Pt to build a healthy plate including vegetables, fruits, whole grains, and low-fat dairy products in a heart healthy meal plan             Personal Goal #2 Re-Evaluation      Personal Goal #2 -- Pt to include 2-3 servings fish per week             Personal Goal #3 Re-Evaluation      Personal Goal #3 -- Pt to eat 5-20 g soluble fiber daily              Nutrition Goals Discharge (Final Nutrition Goals Re-Evaluation):  Nutrition Goals Re-Evaluation - 07/04/21 1100       Goals   Current Weight 158 lb 15.2 oz (72.1 kg)    Nutrition Goal Pt to build a healthy plate including vegetables, fruits, whole grains, and low-fat dairy products in a heart healthy meal plan       Personal Goal #2 Re-Evaluation   Personal Goal #2 Pt to include 2-3 servings fish per week      Personal Goal #3 Re-Evaluation   Personal Goal #3 Pt to eat 5-20 g soluble fiber daily             Psychosocial: Target Goals: Acknowledge presence or absence of significant depression and/or stress, maximize coping skills, provide positive support system. Participant is able to verbalize types and ability to use techniques and skills needed for reducing stress and depression.  Initial Review & Psychosocial Screening:  Initial Psych Review & Screening - 05/31/21 1344       Initial  Review   Current issues with Current Stress Concerns    Source of Stress Concerns Chronic Illness;Unable to participate in former interests or hobbies;Unable to perform yard/household activities    Comments Kim Brown has been concerned about her health since her hospitalization in April.      Family Dynamics   Good Support System? Yes   Kim Brown has her husband of 52 years and 3 children for support     Barriers   Psychosocial barriers to participate in program The patient should benefit from training in stress management and relaxation.      Screening Interventions   Interventions To provide support and resources with identified psychosocial needs;Encouraged to exercise    Expected Outcomes Long Term Goal: Stressors or current issues are controlled or eliminated.;Short Term goal: Identification and review with participant of any Quality of Life or Depression concerns found by scoring the questionnaire.;Long Term goal: The participant improves quality of Life and PHQ9 Scores as seen by post scores and/or verbalization of changes             Quality of Life Scores:  Quality of Life - 05/31/21 1519       Quality of Life   Select Quality of Life      Quality of Life Scores   Health/Function Pre 21.58 %    Socioeconomic Pre 20.93 %    Psych/Spiritual Pre 24.5 %    Family Pre 27.3 %    GLOBAL Pre 23.02 %             Scores of 19 and below usually indicate a poorer quality of life in these areas.  A difference of  2-3 points is a clinically meaningful difference.  A difference of 2-3 points in the total score of the Quality of Life Index has been associated with significant improvement in overall quality of life, self-image, physical symptoms, and general health in studies assessing change in quality of life.  PHQ-9: Recent Review Flowsheet Data     Depression screen [facility] 2/9 05/31/2021 11/30/2020 11/26/2020 09/05/2019 04/24/2018   Decreased Interest 0 0 0 0 0   Down, Depressed, Hopeless 0 0 0 0 0   PHQ - 2 Score 0 0 0 0 0   Altered sleeping - 0 0 0 0   Tired, decreased energy - 0 0 0 0   Change in appetite - 0 0 0 0   Feeling bad or failure about yourself  - 0 0 0 0   Trouble concentrating - 0 0 0 0   Moving slowly or fidgety/restless - 0 0 0 0   Suicidal thoughts - 0 0 0 0   PHQ-9 Score - 0 0 0 0   Difficult doing work/chores - Not difficult at all Not difficult at all Not difficult at all Not difficult at all      Interpretation of Total Score  Total Score Depression Severity:  1-4 = Minimal depression, 5-9 = Mild depression, 10-14 = Moderate depression, 15-19 = Moderately severe depression, 20-27 = Severe depression   Psychosocial Evaluation and Intervention:   Psychosocial Re-Evaluation:  Psychosocial Re-Evaluation     [place] Name 06/06/21 1659 07/07/21 1232           Psychosocial Re-Evaluation   Current issues with Current Stress Concerns Current Stress Concerns      Comments 30 Day ITP Review. Kim Brown started exercise and did well for her fitness level on 06/06/21.Will continue to monitor and offer support as needed 30 Day  ITP Review. Kim Brown has npt voiced any increased stressors. Kim Brown will complete cardiac rehab on next week  per her request due to distanceWill continue to monitor and offer support as needed      Expected Outcomes Kim Brown will have decreased stress upon  completion of phase 2 cardiac rehab. Kim Brown will have decreased stress upon completion of phase 2 cardiac rehab.      Interventions Encouraged to attend Cardiac Rehabilitation for the exercise Encouraged to attend Cardiac Rehabilitation for the exercise      Continue Psychosocial Services  -- No Follow up required             Initial Review      Source of Stress Concerns Unable to perform yard/household activities;Unable to participate in former interests or hobbies;Chronic Illness Unable to perform yard/household activities;Unable to participate in former interests or hobbies;Chronic Illness              Psychosocial Discharge (Final Psychosocial Re-Evaluation):  Psychosocial Re-Evaluation - 07/07/21 1232       Psychosocial Re-Evaluation   Current issues with Current Stress Concerns    Comments 30 Day ITP Review. Kim Brown has npt voiced any increased stressors. Kim Brown will complete cardiac rehab on next week  per her request due to distanceWill continue to monitor and offer support as needed    Expected Outcomes Kim Brown will have decreased stress upon completion of phase 2 cardiac rehab.    Interventions Encouraged to attend Cardiac Rehabilitation for the exercise    Continue Psychosocial Services  No Follow up required      Initial Review   Source of Stress Concerns Unable to perform yard/household activities;Unable to participate in former interests or hobbies;Chronic Illness             Vocational Rehabilitation: Provide vocational rehab assistance to qualifying candidates.   Vocational Rehab Evaluation & Intervention:  Vocational Rehab - 05/31/21 1539       Initial Vocational Rehab Evaluation & Intervention   Assessment shows need for Vocational Rehabilitation No   Charisma is retired and does not need vocational rehab at this time.            Education: Education Goals: Education classes will be provided on a weekly basis, covering required topics. Participant will  state understanding/return demonstration of topics presented.  Learning Barriers/Preferences:  Learning Barriers/Preferences - 05/31/21 1552       Learning Barriers/Preferences   Learning Barriers Exercise Concerns   Dizziness when standing up too fast   Learning Preferences Verbal Instruction;Audio;Computer/Internet;Pictoral             Education Topics: Hypertension, Hypertension Reduction -Define heart disease and high blood pressure. Discus how high blood pressure affects the body and ways to reduce high blood pressure.   Exercise and Your Heart -Discuss why it is important to exercise, the FITT principles of exercise, normal and abnormal responses to exercise, and how to exercise safely.   Angina -Discuss definition of angina, causes of angina, treatment of angina, and how to decrease risk of having angina.   Cardiac Medications -Review what the following cardiac medications are used for, how they affect the body, and side effects that may occur when taking the medications.  Medications include Aspirin, Beta blockers, calcium channel blockers, ACE Inhibitors, angiotensin receptor blockers, diuretics, digoxin, and antihyperlipidemics.   Congestive Heart Failure -Discuss the definition of CHF, how to live with CHF, the signs and symptoms of CHF, and how keep track of weight and sodium intake.  Heart Disease and Intimacy -Discus the effect sexual activity has on the heart, how changes occur during intimacy as we age, and safety during sexual activity.   Smoking Cessation / COPD -Discuss different methods to quit smoking, the health benefits of quitting smoking, and the definition of COPD.   Nutrition I: Fats -Discuss the types of cholesterol, what cholesterol does to the heart, and how cholesterol levels can be controlled.   Nutrition II: Labels -Discuss the different components of food labels and how to read food label   Heart Parts/Heart Disease and  PAD -Discuss the anatomy of the heart, the pathway of blood circulation through the heart, and these are affected by heart disease.   Stress I: Signs and Symptoms -Discuss the causes of stress, how stress may lead to anxiety and depression, and ways to limit stress.   Stress II: Relaxation -Discuss different types of relaxation techniques to limit stress.   Warning Signs of Stroke / TIA -Discuss definition of a stroke, what the signs and symptoms are of a stroke, and how to identify when someone is having stroke.   Knowledge Questionnaire Score:  Knowledge Questionnaire Score - 05/31/21 1536       Knowledge Questionnaire Score   Pre Score 22/24             Core Components/Risk Factors/Patient Goals at Admission:  Personal Goals and Risk Factors at Admission - 05/31/21 1443       Core Components/Risk Factors/Patient Goals on Admission    Weight Management Yes;Obesity    Intervention Weight Management/Obesity: Establish reasonable short term and long term weight goals.;Obesity: Provide education and appropriate resources to help participant work on and attain dietary goals.    Admit Weight 157 lb 6.5 oz (71.4 kg)    Expected Outcomes Short Term: Continue to assess and modify interventions until short term weight is achieved;Long Term: Adherence to nutrition and physical activity/exercise program aimed toward attainment of established weight goal;Weight Loss: Understanding of general recommendations for a balanced deficit meal plan, which promotes 1-2 lb weight loss per week and includes a negative energy balance of [phone] kcal/d    Hypertension Yes    Intervention Provide education on lifestyle modifcations including regular physical activity/exercise, weight management, moderate sodium restriction and increased consumption of fresh fruit, vegetables, and low fat dairy, alcohol moderation, and smoking cessation.;Monitor prescription use compliance.    Expected Outcomes Short  Term: Continued assessment and intervention until BP is < 140/21mm HG in hypertensive participants. < 130/37mm HG in hypertensive participants with diabetes, heart failure or chronic kidney disease.;Long Term: Maintenance of blood pressure at goal levels.    Lipids Yes    Intervention Provide education and support for participant on nutrition & aerobic/resistive exercise along with prescribed medications to achieve LDL 70mg , HDL >40mg .    Expected Outcomes Short Term: Participant states understanding of desired cholesterol values and is compliant with medications prescribed. Participant is following exercise prescription and nutrition guidelines.;Long Term: Cholesterol controlled with medications as prescribed, with individualized exercise RX and with personalized nutrition plan. Value goals: LDL < 70mg , HDL > 40 mg.    Stress Yes    Intervention Offer individual and/or small group education and counseling on adjustment to heart disease, stress management and health-related lifestyle change. Teach and support self-help strategies.;Refer participants experiencing significant psychosocial distress to appropriate mental health specialists for further evaluation and treatment. When possible, include family members and significant others in education/counseling sessions.    Expected Outcomes Short Term: Participant demonstrates  changes in health-related behavior, relaxation and other stress management skills, ability to obtain effective social support, and compliance with psychotropic medications if prescribed.;Long Term: Emotional wellbeing is indicated by absence of clinically significant psychosocial distress or social isolation.    Personal Goal Other Yes    Personal Goal Improve balance. Build strength in right shoulder.    Intervention Incorporate balance training exercises 2 days/week in addition to stretching and resistance training to help increase balance and strength.    Expected Outcomes Patient  will maintain consistent exercise routine including balance exercises and resistance training.             Core Components/Risk Factors/Patient Goals Review:   Goals and Risk Factor Review     Row Name 06/06/21 1706 07/07/21 1235           Core Components/Risk Factors/Patient Goals Review   Personal Goals Review Weight Management/Obesity;Lipids;Stress;Hypertension Weight Management/Obesity;Lipids;Stress;Hypertension      Review Kim Brown started exercise on 06/06/21 and did well with exercise.Vital signs were stable Kim Brown has been doing well with exercise .Vital signs were stable. Kim Brown will complete cardiac rehab early due to distance      Expected [place] will continue to participate in phase 2 cardiac rehab for exercise, nutrition and lifestyle modifications Kim Brown will continue to participate in phase 2 cardiac rehab for exercise, nutrition and lifestyle modifications               Core Components/Risk Factors/Patient Goals at Discharge (Final Review):   Goals and Risk Factor Review - 07/07/21 1235       Core Components/Risk Factors/Patient Goals Review   Personal Goals Review Weight Management/Obesity;Lipids;Stress;Hypertension    Review Kim Brown has been doing well with exercise .Vital signs were stable. Kim Brown will complete cardiac rehab early due to distance    Expected [place] will continue to participate in phase 2 cardiac rehab for exercise, nutrition and lifestyle modifications             ITP Comments:  ITP Comments     Row Name 05/31/21 1318 06/06/21 1658 07/07/21 1229       ITP Comments Medical Director- Dr. Fransico Him, MD 30 Day ITP Review. Kim Brown started exercise on 06/06/21 and did well with exercise. 30 Day ITP Review. Kim Brown has good attendance and partcipation in phase 2 cardiac rehab. Kim Brown wants to complete cardiac rehab next week              Comments: See ITP comments. Kim Brown wants to complete cardiac rehab next week due to travel  distance Barnet Pall, [place] 07/07/2021 12:39 PM

## 2021-07-08 ENCOUNTER — Encounter (HOSPITAL_COMMUNITY): Payer: PPO

## 2021-07-11 ENCOUNTER — Encounter (HOSPITAL_COMMUNITY): Payer: PPO

## 2021-07-11 ENCOUNTER — Telehealth (HOSPITAL_COMMUNITY): Payer: Self-pay | Admitting: Family Medicine

## 2021-07-13 ENCOUNTER — Other Ambulatory Visit: Payer: Self-pay

## 2021-07-13 ENCOUNTER — Encounter (HOSPITAL_COMMUNITY)
Admission: RE | Admit: 2021-07-13 | Discharge: 2021-07-13 | Disposition: A | Discharge status: Home / Self Care | Payer: PPO | Source: Ambulatory Visit | Attending: Interventional Cardiology | Admitting: Interventional Cardiology

## 2021-07-13 DIAGNOSIS — I213 ST elevation (STEMI) myocardial infarction of unspecified site: Secondary | ICD-10-CM

## 2021-07-13 DIAGNOSIS — Z955 Presence of coronary angioplasty implant and graft: Secondary | ICD-10-CM

## 2021-07-13 NOTE — Progress Notes (Addendum)
Kim Brown completed 9 out of 24 sessions on 07/14/21. Kim Brown decided to complete the program early due to the commute time/ distance from home. Kim Brown  felt she would be more comfortable exercising at home via walking.  Medications reviewed. Kim Brown has been discharged from the program. Future appointment's have been cancelled. Barnet Pall, RN,BSN 07/13/2021 2:21 PM

## 2021-07-15 ENCOUNTER — Encounter (HOSPITAL_COMMUNITY): Payer: PPO

## 2021-07-18 ENCOUNTER — Encounter (HOSPITAL_COMMUNITY): Payer: PPO

## 2021-07-20 ENCOUNTER — Encounter (HOSPITAL_COMMUNITY): Payer: PPO

## 2021-07-21 ENCOUNTER — Other Ambulatory Visit: Payer: Self-pay

## 2021-07-21 ENCOUNTER — Ambulatory Visit (HOSPITAL_COMMUNITY): Payer: PPO | Attending: Cardiovascular Disease

## 2021-07-21 DIAGNOSIS — R5383 Other fatigue: Secondary | ICD-10-CM

## 2021-07-21 DIAGNOSIS — I25118 Atherosclerotic heart disease of native coronary artery with other forms of angina pectoris: Secondary | ICD-10-CM

## 2021-07-22 ENCOUNTER — Encounter (HOSPITAL_COMMUNITY): Payer: PPO

## 2021-07-22 LAB — ECHOCARDIOGRAM COMPLETE
Area-P 1/2: 2.56 cm2
S' Lateral: 2.6 cm

## 2021-07-25 ENCOUNTER — Encounter (HOSPITAL_COMMUNITY): Payer: PPO

## 2021-07-27 ENCOUNTER — Encounter (HOSPITAL_COMMUNITY): Payer: PPO

## 2021-07-29 ENCOUNTER — Encounter (HOSPITAL_COMMUNITY): Payer: PPO

## 2021-08-29 IMAGING — DX DG CHEST 1V PORT
1 series · 1 of 1 positions shown · non-contrast
Comparison: 07/12/2020

CLINICAL DATA: Chest pain

EXAM:
PORTABLE CHEST 1 VIEW

[chest ap]
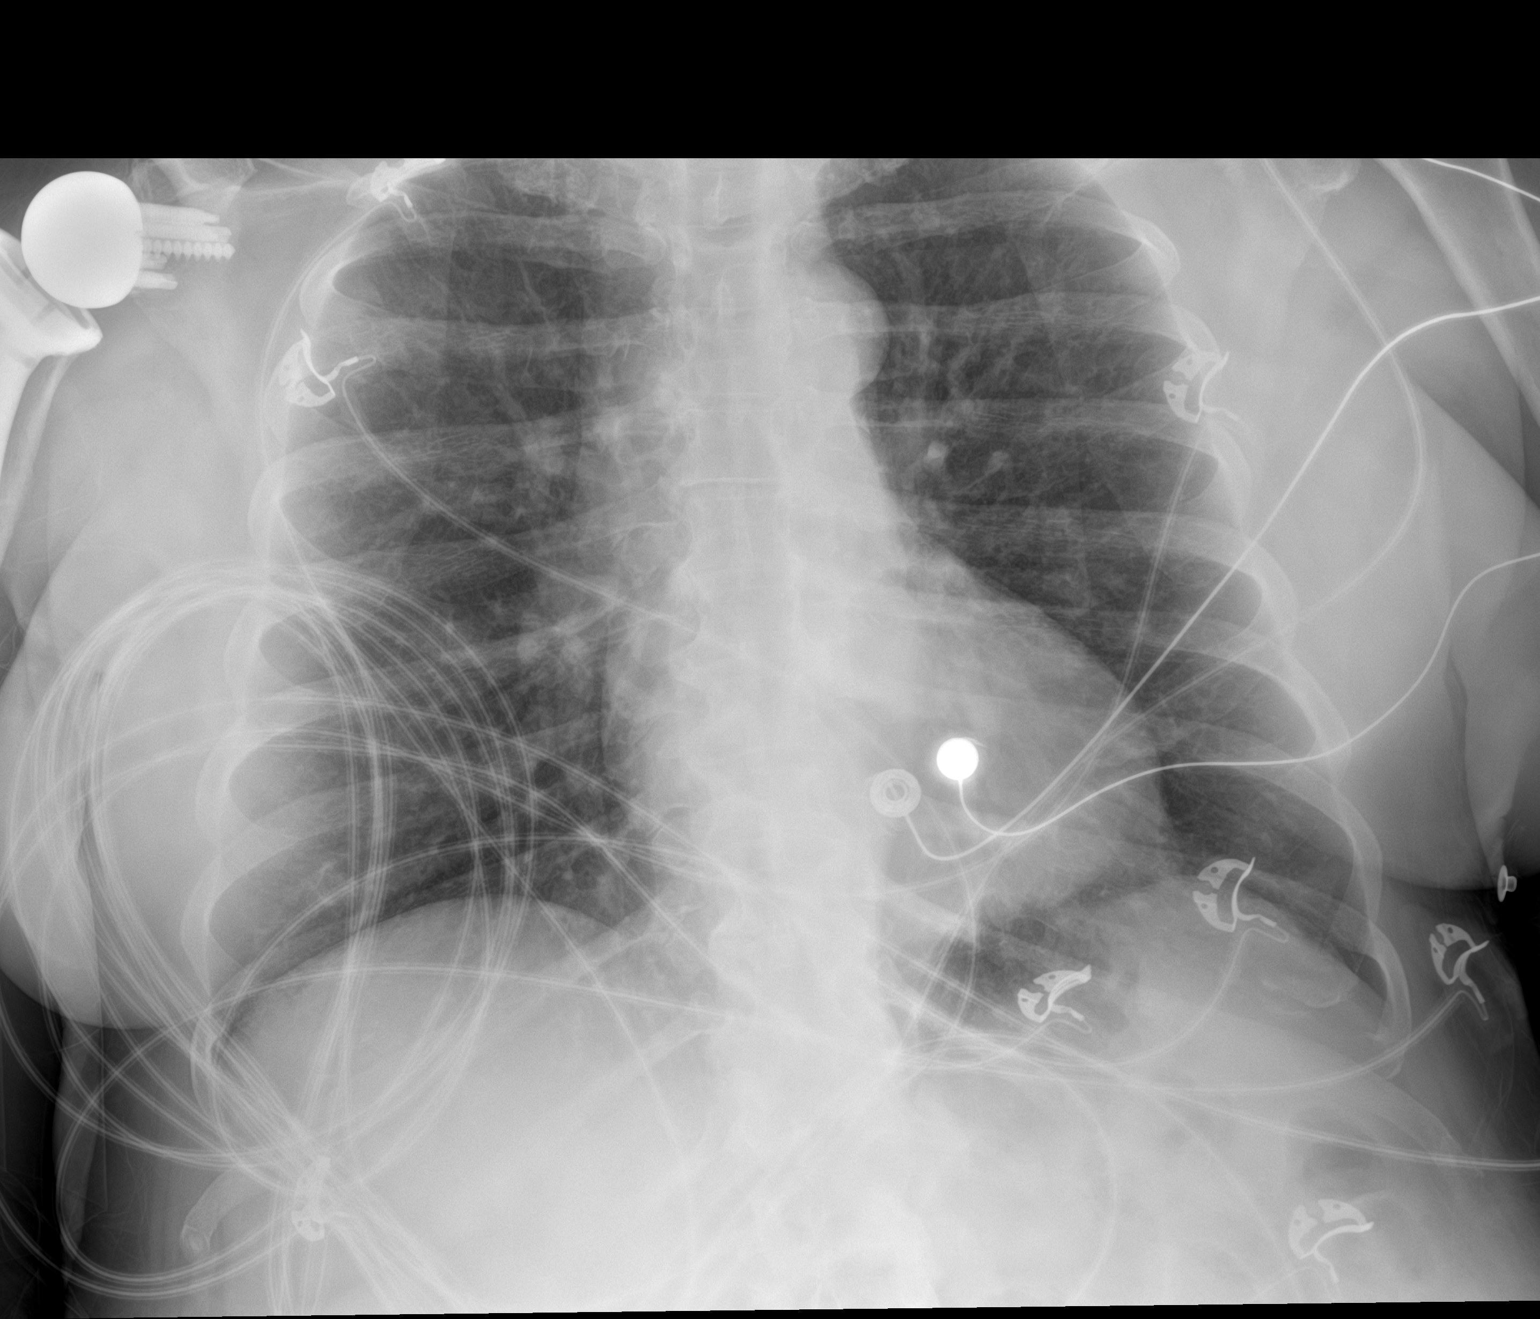

[1 of 1 positions shown; findings below may reference images not displayed]

FINDINGS: Heart and mediastinal contours are within normal limits. No focal
opacities or effusions. No acute bony abnormality.
IMPRESSION: No active disease.

## 2021-09-01 ENCOUNTER — Telehealth: Payer: Self-pay | Admitting: Interventional Cardiology

## 2021-09-01 DIAGNOSIS — R42 Dizziness and giddiness: Secondary | ICD-10-CM

## 2021-09-01 DIAGNOSIS — R0602 Shortness of breath: Secondary | ICD-10-CM

## 2021-09-01 NOTE — Telephone Encounter (Signed)
Spoke with pt's husband and after coming home from the hospital in April  felt great and about 3 days later started to feel dizzy "anxious or nervous feeling inside "and weakness Pt feels maybe coming from the Metoprolol or Plavix Pt LV and RV function have returned to normal  and HR generally runs in the 70's and B/P running anywhere form 114-120/70's Pt did not tolerate Briilinta had a reaction so was switched to Plavix .Will forward to Dr Irish Lack for review and recommendations ./cy

## 2021-09-01 NOTE — Telephone Encounter (Signed)
Feels nervous, has difficulty breathing and not sure of what the cause of the issue is   Pt c/o medication issue:  1. Name of Medication: metoprolol tartrate (LOPRESSOR) 25 MG tablet  clopidogrel (PLAVIX) 75 MG tablet   2. How are you currently taking this medication (dosage and times per day)?  clopidogrel (PLAVIX) 75 MG tablet Take 1 tablet (75 mg total) by mouth daily. Take 600 mg (8 tablets) tonight as a loading dose, then 75 mg daily (1 tablet)   metoprolol tartrate (LOPRESSOR) 25 MG tablet Take 1 tablet (25 mg total) by mouth 2 (two) times daily.   3. Are you having a reaction (difficulty breathing--STAT)? Lifeless, nervous, difficulty breathing   4. What is your medication issue? Since starting this medication pt has been feeling multiple symptoms (see above) and is needing to know if this is due to the medication she is taking or from stent... please advise

## 2021-09-04 NOTE — Telephone Encounter (Signed)
Good vital signs.  I don't think it is her meds Can check CMet, BNP CBC and TSH for her dizziness and shortness of breath.  WOuld also check chest xray

## 2021-09-05 IMAGING — DX DG CHEST 1V PORT
1 series · 1 of 1 positions shown · non-contrast
Comparison: 03/20/2021

CLINICAL DATA: Weakness

EXAM:
PORTABLE CHEST 1 VIEW

[chest ap]
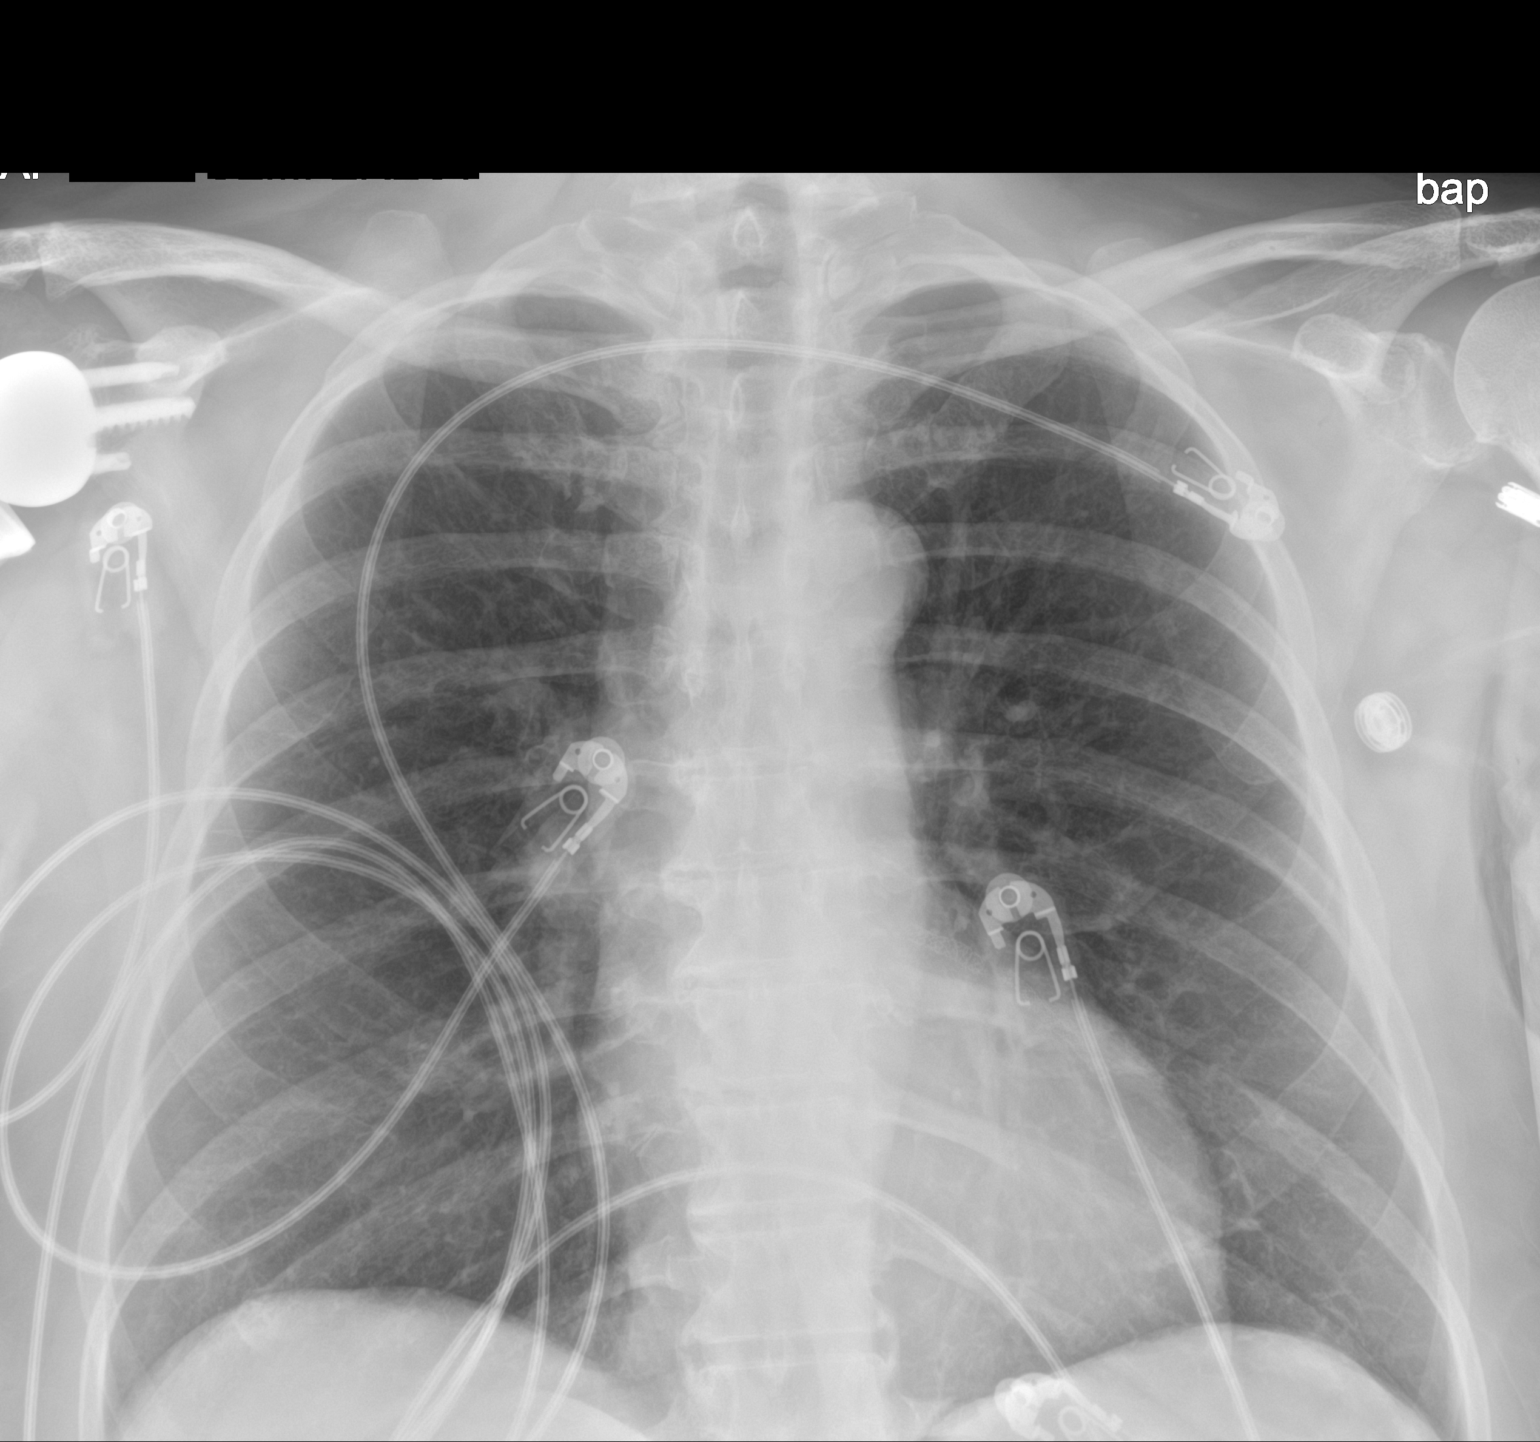

[1 of 1 positions shown; findings below may reference images not displayed]

FINDINGS: The heart size and mediastinal contours are within normal limits.
Both lungs are clear. Partially visualized right shoulder
replacement.
IMPRESSION: No active disease.

## 2021-09-05 NOTE — Telephone Encounter (Signed)
Called and spoke with husband ok per DPR.  He reports that pt is tired a lot.  Expresses that she can feed the dog and come back in very tired.  Husband is concerned that tiredness could be result of medications.  Husband will send in a my chart message with the day pt will come in for labs.  Per husband pt may be to tired to come in today for lab work.  I gave him address to report to for CXR.  All questions were answered.

## 2021-09-06 NOTE — Telephone Encounter (Signed)
Called husband he reports that he will bring wife in for lab work tomorrow 09/07/21.  I informed him of lab hours.  No questions or concerns.

## 2021-09-07 ENCOUNTER — Ambulatory Visit
Admission: RE | Admit: 2021-09-07 | Discharge: 2021-09-07 | Disposition: A | Discharge status: Home / Self Care | Payer: PPO | Source: Ambulatory Visit | Attending: Interventional Cardiology | Admitting: Interventional Cardiology

## 2021-09-07 ENCOUNTER — Other Ambulatory Visit: Payer: Self-pay

## 2021-09-07 ENCOUNTER — Other Ambulatory Visit: Payer: PPO | Admitting: *Deleted

## 2021-09-07 DIAGNOSIS — R0602 Shortness of breath: Secondary | ICD-10-CM

## 2021-09-07 DIAGNOSIS — R42 Dizziness and giddiness: Secondary | ICD-10-CM

## 2021-09-08 LAB — COMPREHENSIVE METABOLIC PANEL
ALT: 9 IU/L (ref 0–32)
AST: 15 IU/L (ref 0–40)
Albumin/Globulin Ratio: 2.2 (ref 1.2–2.2)
Albumin: 4.2 g/dL (ref 3.8–4.8)
Alkaline Phosphatase: 69 IU/L (ref 44–121)
BUN/Creatinine Ratio: 22 (ref 12–28)
BUN: 18 mg/dL (ref 8–27)
Bilirubin Total: 0.2 mg/dL (ref 0.0–1.2)
CO2: 24 mmol/L (ref 20–29)
Calcium: 9.2 mg/dL (ref 8.7–10.3)
Chloride: 100 mmol/L (ref 96–106)
Creatinine, Ser: 0.82 mg/dL (ref 0.57–1.00)
Globulin, Total: 1.9 g/dL (ref 1.5–4.5)
Glucose: 98 mg/dL (ref 65–99)
Potassium: 4.4 mmol/L (ref 3.5–5.2)
Sodium: 141 mmol/L (ref 134–144)
Total Protein: 6.1 g/dL (ref 6.0–8.5)
eGFR: 77 mL/min/{1.73_m2} (ref >59)

## 2021-09-08 LAB — CBC
Hematocrit: 42.2 % (ref 34.0–46.6)
Hemoglobin: 14.4 g/dL (ref 11.1–15.9)
MCH: 31.2 pg (ref 26.6–33.0)
MCHC: 34.1 g/dL (ref 31.5–35.7)
MCV: 92 fL (ref 79–97)
Platelets: 268 10*3/uL (ref 150–450)
RBC: 4.61 x10E6/uL (ref 3.77–5.28)
RDW: 13.1 % (ref 11.7–15.4)
WBC: 8.8 10*3/uL (ref 3.4–10.8)

## 2021-09-08 LAB — PRO B NATRIURETIC PEPTIDE: NT-Pro BNP: 124 pg/mL (ref 0–301)

## 2021-09-08 LAB — TSH: TSH: 1.31 u[IU]/mL (ref 0.450–4.500)

## 2021-09-14 DIAGNOSIS — Z96611 Presence of right artificial shoulder joint: Secondary | ICD-10-CM | POA: Diagnosis not present

## 2021-09-14 DIAGNOSIS — Z471 Aftercare following joint replacement surgery: Secondary | ICD-10-CM | POA: Diagnosis not present

## 2021-09-20 NOTE — Progress Notes (Signed)
Cardiology Office Note   Date:  09/21/2021   ID:  Kim, Brown [birth year], MRN [id number]  PCP:  Tonia Ghent, MD    No chief complaint on file.  CAD  Wt Readings from Last 3 Encounters:  09/21/21 158 lb (71.7 kg)  05/31/21 157 lb 6.5 oz (71.4 kg)  04/17/21 159 lb (72.1 kg)       History of Present Illness: Kim Brown is a 70 y.o. female  With a h/o hyperlipidemia who had an anterior MI in 4/22.  Cath, done a few hours after the [place] game, showed: "Mid LAD-1 lesion is 100% stenosed. After thrombectomy, a drug-eluting stent was successfully placed using a SYNERGY XD 3.0X16, postdilated to 3.75 mm. Post intervention, there is a 0% residual stenosis. Mid LAD-2 lesion is 95% stenosed. A drug-eluting stent was successfully placed using a SYNERGY XD 2.25X28, postdilated to 2.5 mm. Post intervention, there is a 0% residual stenosis. Mid Cx lesion is 90% stenosed. A drug-eluting stent was successfully placed using a SYNERGY XD 3.50X16, postdilated to 3.75 mm. Post intervention, there is a 0% residual stenosis. 2nd Diag lesion is 50% stenosed. Dist LAD lesion is 25% stenosed. The mid to distal LAD was a very small vessel in general. There is mild to moderate left ventricular systolic dysfunction. LV end diastolic pressure is moderately elevated. LVEDP 23 mm Hg. The left ventricular ejection fraction is 35-45% by visual estimate. There is no aortic valve stenosis.   Continue dual antiplatelet therapy for 12 months.  She will need aggressive secondary prevention.  She had myalgias with lovastatin.  Start rosuvastatin 20 mg daily.  She will need metoprolol and likely an ACE inhibitor depending on her ejection fraction.  We will plan for echocardiogram on Monday.  No IV fluids due to elevated LVEDP in the setting of anterior wall MI and decreased LVEF."   Admitted with dizziness a week after cath. SHe was given fluid.  She had urinary retention of 1600 cc.    Coronary artery disease status post recent angioplasty LAD and circumflex. Ischemic cardiomyopathy, ejection fraction 97-35% chronic systolic heart failure. She was continued on dual antiplatelet therapy with aspirin and ticagrelor. Continue beta-blockade with metoprolol, avoiding ACE or ARB due to risk of hypotension.  She has had persistent fatigue and other such symptoms since her MI.  Normal BNP.  Repeat echocardiogram showed improvement of LVEF.  She describes daily anxiety.  She walks daily.  No exertional chest discomfort.  Denies : Chest pain.  Leg edema. Nitroglycerin use. Orthopnea. Palpitations. Paroxysmal nocturnal dyspnea. Shortness of breath. Syncope.    Past Medical History:  Diagnosis Date   Allergy    Anemia    Arthritis    feet and hands , neck, shoulder   Coronary artery disease    Dysrhythmia 2006   atrial flutter   Exogenous obesity    Heel spur    Hyperlipidemia    Hypertension    Hypothyroid    Irregular heart beats    Ischemic cardiomyopathy    Palpitations    Prolapsed, anus    uses mag oxide powder nightly    RSD (reflex sympathetic dystrophy)    after surgery on R foot   STEMI (ST elevation myocardial infarction) ([facility])    Stress incontinence, female     Past Surgical History:  Procedure Laterality Date   ABDOMINAL HYSTERECTOMY  2005   BUNIONECTOMY     COLONOSCOPY     CORONARY ANGIOPLASTY  CORONARY STENT INTERVENTION N/A 03/20/2021   Procedure: CORONARY STENT INTERVENTION;  Surgeon: Jettie Booze, MD;  Location: [facility];  Service: Cardiovascular;  Laterality: N/A;   CORONARY/GRAFT ACUTE MI REVASCULARIZATION N/A 03/20/2021   Procedure: Coronary/Graft Acute MI Revascularization;  Surgeon: Jettie Booze, MD;  Location: [facility];  Service: Cardiovascular;  Laterality: N/A;   DILATION AND CURETTAGE OF UTERUS     LEFT HEART CATH AND CORONARY ANGIOGRAPHY N/A 03/20/2021   Procedure: LEFT HEART CATH AND CORONARY  ANGIOGRAPHY;  Surgeon: Jettie Booze, MD;  Location: [facility];  Service: Cardiovascular;  Laterality: N/A;   REVERSE SHOULDER ARTHROPLASTY Right 07/15/2020   Procedure: REVERSE SHOULDER ARTHROPLASTY;  Surgeon: Tania Ade, MD;  Location: [facility];  Service: Orthopedics;  Laterality: Right;     Current Outpatient Medications  Medication Sig Dispense Refill   Ascorbic Acid (VITAMIN C) 1000 MG tablet Take 1,000 mg by mouth 2 (two) times daily.     aspirin 81 MG EC tablet Take 1 tablet (81 mg total) by mouth daily. 30 tablet 11   b complex vitamins tablet Take 1 tablet by mouth 3 (three) times a week.     beta carotene 10000 UNIT capsule Take 10,000 Units by mouth daily.     Calcium Carbonate-Vitamin D (CALTRATE 600+D PO) Take 1 tablet by mouth daily.     Cholecalciferol (VITAMIN D) 2000 UNITS CAPS Take 1 capsule by mouth daily.     clopidogrel (PLAVIX) 75 MG tablet Take 1 tablet (75 mg total) by mouth daily. Take 600 mg (8 tablets) tonight as a loading dose, then 75 mg daily (1 tablet) 90 tablet 3   DHEA 25 MG CAPS Take 25 mg by mouth daily.     fish oil-omega-3 fatty acids 1000 MG capsule Take 1 g by mouth daily.     Magnesium 250 MG TABS Take 250 mg by mouth daily.     Melatonin 10 MG TABS Take 10 mg by mouth at bedtime.      metoprolol succinate (TOPROL-XL) 25 MG 24 hr tablet Take 25 mg by mouth daily.     nitroGLYCERIN (NITROSTAT) 0.4 MG SL tablet Place 1 tablet (0.4 mg total) under the tongue every 5 (five) minutes as needed for chest pain. 25 tablet 3   pantoprazole (PROTONIX) 40 MG tablet Take 1 tablet (40 mg total) by mouth daily. 30 tablet 0   phenylephrine (SUDAFED PE) 10 MG TABS tablet Take 10 mg by mouth every 4 (four) hours as needed (sinus).     Polyethyl Glycol-Propyl Glycol (SYSTANE) 0.4-0.3 % SOLN Place 1 drop into both eyes 2 (two) times daily.     progesterone (PROMETRIUM) 100 MG capsule Take 300 mg by mouth at bedtime.      thyroid (ARMOUR) 60 MG tablet  Take one tablet in am and one tablet in pm. 60 tablet 0   zinc gluconate 50 MG tablet Take 50 mg by mouth daily.     rosuvastatin (CRESTOR) 40 MG tablet Take 1 tablet (40 mg total) by mouth daily. (Patient not taking: Reported on 09/21/2021) 90 tablet 3   No current facility-administered medications for this visit.    Allergies:   Metoprolol succinate [metoprolol], Albuterol, and Lovastatin    Social History:  The patient  reports that she has never smoked. She has never used smokeless tobacco. She reports that she does not drink alcohol and does not use drugs.   Family History:  The patient's family  history includes Arthritis in her father and mother; Colon polyps in her mother; Diabetes in her brother; Heart disease in her father; Heart failure in her brother; Hypertension in her father and mother; Pancreatic cancer in her brother; Stroke in her father.    ROS:  Please see the history of present illness.   Otherwise, review of systems are positive for fatigue.   All other systems are reviewed and negative.    PHYSICAL EXAM: VS:  BP 126/68   Pulse 75   Ht 5' 4.5" (1.638 m)   Wt 158 lb (71.7 kg)   SpO2 97%   BMI 26.70 kg/m  , BMI Body mass index is 26.7 kg/m. GEN: Well nourished, well developed, in no acute distress HEENT: normal Neck: no JVD, carotid bruits, or masses Cardiac: RRR; no murmurs, rubs, or gallops,no edema  Respiratory:  clear to auscultation bilaterally, normal work of breathing GI: soft, nontender, nondistended, + BS MS: no deformity or atrophy Skin: warm and dry, no rash Neuro:  Strength and sensation are intact Psych: euthymic mood, flat ; she looks at her family before answering many questions like "is your appetite normal?"    Recent Labs: 03/20/2021: Magnesium 2.1 03/27/2021: B Natriuretic Peptide 161.8 09/07/2021: ALT 9; BUN 18; Creatinine, Ser 0.82; Hemoglobin 14.4; NT-Pro BNP 124; Platelets 268; Potassium 4.4; Sodium 141; TSH 1.310   Lipid Panel     Component Value Date/Time   CHOL 156 04/17/2021 1612   TRIG 132 04/17/2021 1612   HDL 41 04/17/2021 1612   CHOLHDL 3.8 04/17/2021 1612   VLDL 26 04/17/2021 1612   LDLCALC 89 04/17/2021 1612   LDLDIRECT 184.0 09/05/2019 1120     Other studies Reviewed: Additional studies/ records that were reviewed today with results demonstrating: labs reviewed.   ASSESSMENT AND PLAN:  CAD/Old MI: No evidence of heart failure based on prior blood test.  Family is concerned that clopidogrel may be causing her fatigue and anxiety.  Per some Internet research, they saw some documentation where clopidogrel can cause the symptoms.  No bleeding problems.  They want to stop the medicine for a few days and see if she improves.  I did explain there is a risk of stent thrombosis by stopping the Plavix I encouraged him to stay on the aspirin.  I think long-term, given her diffuse disease, she would benefit from long-term clopidogrel monotherapy. Hyperlipidemia: intolerant to statins-stopped Crestor. Whole food, plant based diet. Increase fiber intake.  PharmD lipid clinic.  Dizziness: Thought to be related to her beta-blocker.  We decreased the dosage.  Ultimately, we stopped the medicine.  The dizziness has persisted.  It was worse when she leaned her head back to take some eyedrops.  She may have benign positional vertigo. BP well controlled.  Daughter tells me that over the years, the patient has not tolerated medications well.  She has had a lot of side effects.   Current medicines are reviewed at length with the patient today.  The patient concerns regarding her medicines were addressed.  The following changes have been made:  No change  Labs/ tests ordered today include:  No orders of the defined types were placed in this encounter.   Recommend 150 minutes/week of aerobic exercise Low fat, low carb, high fiber diet recommended  Disposition:   FU in 6 months   Signed, Larae Grooms, MD   09/21/2021 10:00 AM    [facility], [place], [place]  [address] Phone: [phone])  [phone]; Fax: [phone]    

## 2021-09-21 ENCOUNTER — Ambulatory Visit: Payer: PPO | Admitting: Interventional Cardiology

## 2021-09-21 ENCOUNTER — Other Ambulatory Visit: Payer: Self-pay

## 2021-09-21 ENCOUNTER — Encounter: Payer: Self-pay | Admitting: Interventional Cardiology

## 2021-09-21 VITALS — BP 126/68 | HR 75 | Ht 64.5 in | Wt 158.0 lb

## 2021-09-21 DIAGNOSIS — R0602 Shortness of breath: Secondary | ICD-10-CM

## 2021-09-21 DIAGNOSIS — I25118 Atherosclerotic heart disease of native coronary artery with other forms of angina pectoris: Secondary | ICD-10-CM | POA: Diagnosis not present

## 2021-09-21 DIAGNOSIS — E782 Mixed hyperlipidemia: Secondary | ICD-10-CM

## 2021-09-21 DIAGNOSIS — H04123 Dry eye syndrome of bilateral lacrimal glands: Secondary | ICD-10-CM | POA: Diagnosis not present

## 2021-09-21 DIAGNOSIS — H2513 Age-related nuclear cataract, bilateral: Secondary | ICD-10-CM | POA: Diagnosis not present

## 2021-09-21 DIAGNOSIS — R002 Palpitations: Secondary | ICD-10-CM

## 2021-09-21 NOTE — Patient Instructions (Addendum)
Medication Instructions:  Your physician recommends that you continue on your current medications as directed. Please refer to the Current Medication list given to you today.  *If you need a refill on your cardiac medications before your next appointment, please call your pharmacy*   Lab Work: none If you have labs (blood work) drawn today and your tests are completely normal, you will receive your results only by: [place] (if you have MyChart) OR A paper copy in the mail If you have any lab test that is abnormal or we need to change your treatment, we will call you to review the results.   Testing/Procedures: none   Follow-Up: At [facility], you and your health needs are our priority.  As part of our continuing mission to provide you with exceptional heart care, we have created designated Provider Care Teams.  These Care Teams include your primary Cardiologist (physician) and Advanced Practice Providers (APPs -  Physician Assistants and Nurse Practitioners) who all work together to provide you with the care you need, when you need it.  We recommend signing up for the patient portal called "MyChart".  Sign up information is provided on this After Visit Summary.  MyChart is used to connect with patients for Virtual Visits (Telemedicine).  Patients are able to view lab/test results, encounter notes, upcoming appointments, etc.  Non-urgent messages can be sent to your provider as well.   To learn more about what you can do with MyChart, go to [email or web address].    Your next appointment:   6 month(s)  The format for your next appointment:   In Person  Provider:   You may see Larae Grooms, MD or one of the following Advanced Practice Providers on your designated Care Team:   Melina Copa, PA-C Ermalinda Barrios, PA-C   Other Instructions Please schedule patient for new patient appointment in the lipid clinic  Stop Plavix for a few days.  Send a message with update  on your symptoms after stopping for a few days.

## 2021-09-26 IMAGING — DX DG CHEST 1V PORT
1 series · 1 of 1 positions shown · non-contrast
Comparison: March 27, 2021

CLINICAL DATA: Chest pain.

EXAM:
PORTABLE CHEST 1 VIEW

[chest]
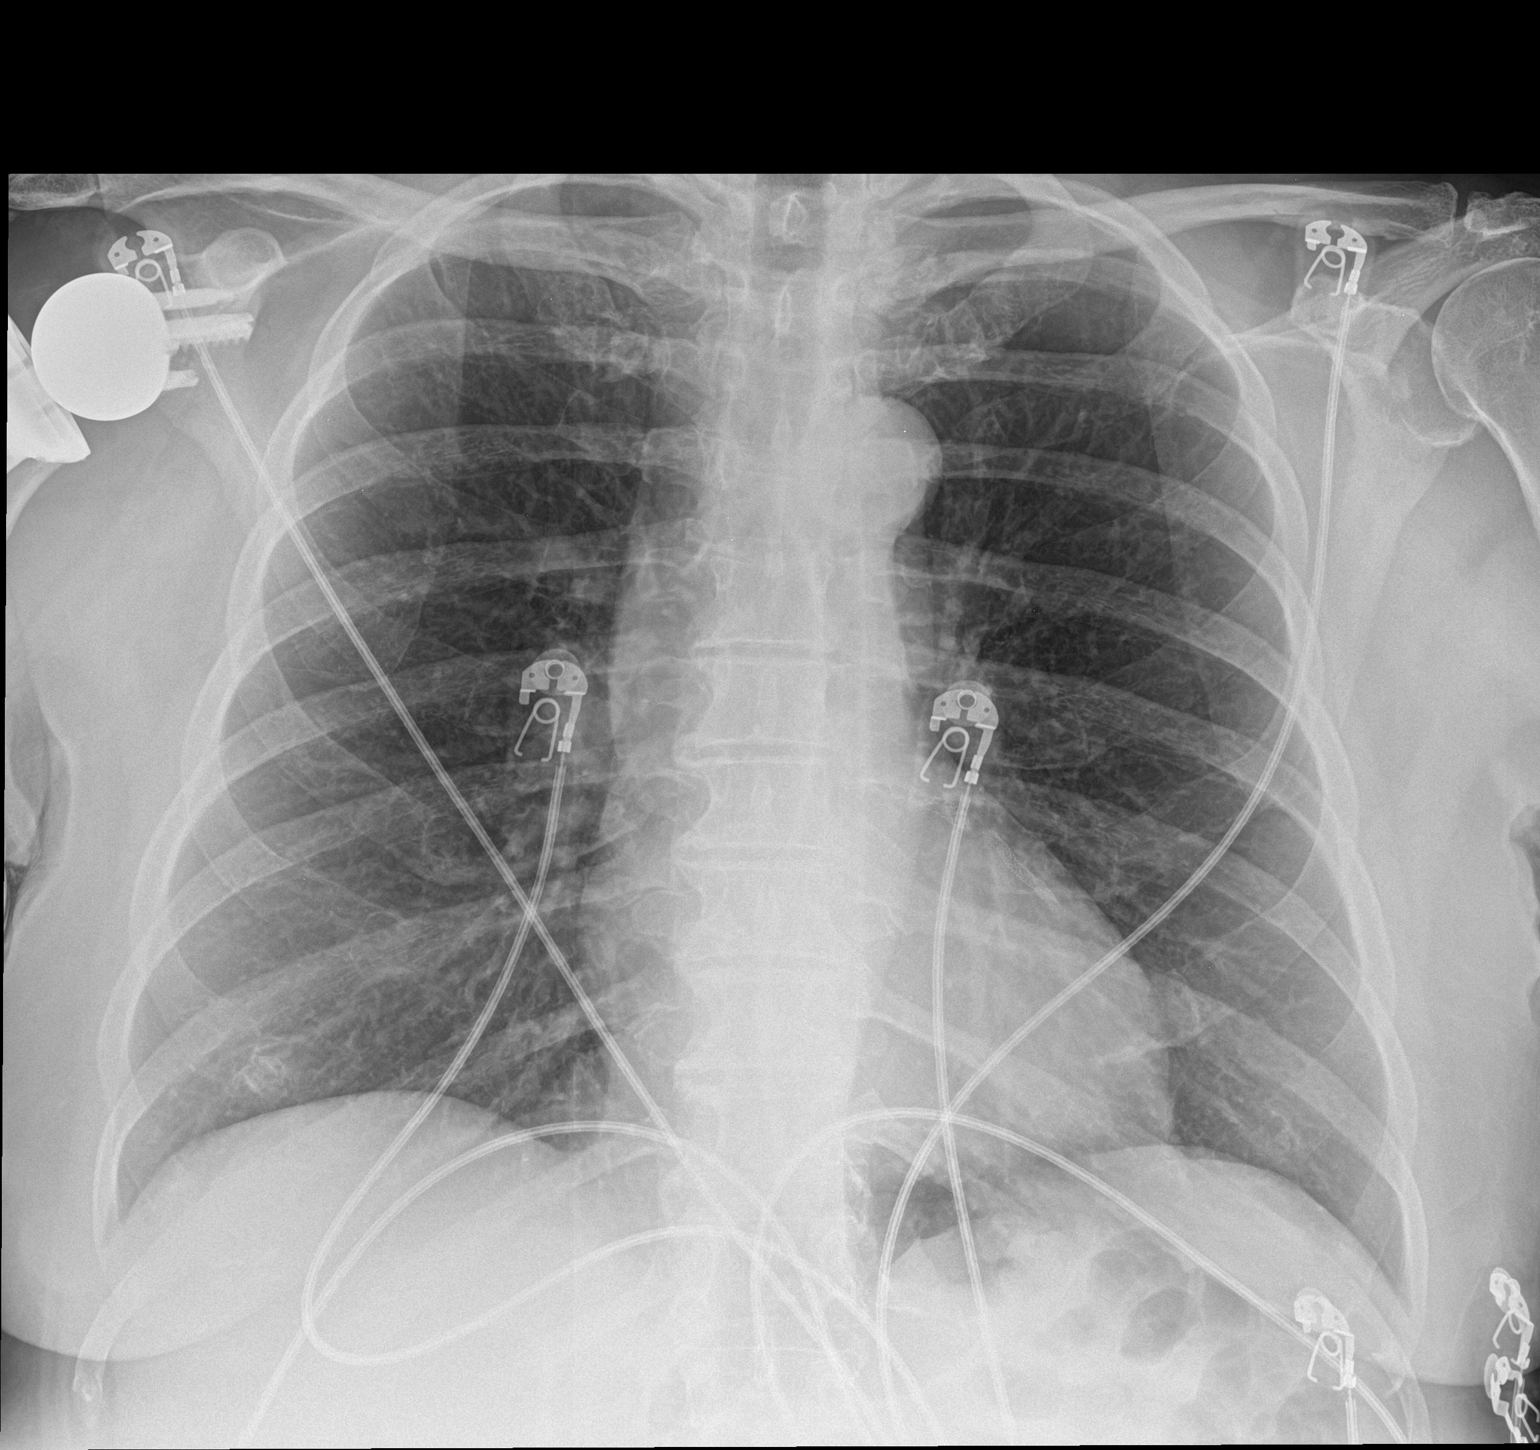

[1 of 1 positions shown; findings below may reference images not displayed]

FINDINGS: The heart size and mediastinal contours are within normal limits.
Both lungs are clear. The visualized skeletal structures are
unremarkable.
IMPRESSION: No active disease.

## 2021-10-10 ENCOUNTER — Other Ambulatory Visit (HOSPITAL_COMMUNITY): Payer: Self-pay

## 2021-10-25 ENCOUNTER — Other Ambulatory Visit (HOSPITAL_COMMUNITY): Payer: Self-pay

## 2021-11-02 ENCOUNTER — Other Ambulatory Visit: Payer: Self-pay

## 2021-11-02 ENCOUNTER — Ambulatory Visit: Payer: PPO | Admitting: Pharmacist

## 2021-11-02 DIAGNOSIS — T466X5A Adverse effect of antihyperlipidemic and antiarteriosclerotic drugs, initial encounter: Secondary | ICD-10-CM | POA: Diagnosis not present

## 2021-11-02 DIAGNOSIS — G720 Drug-induced myopathy: Secondary | ICD-10-CM

## 2021-11-02 DIAGNOSIS — E782 Mixed hyperlipidemia: Secondary | ICD-10-CM

## 2021-11-02 MED ORDER — ROSUVASTATIN CALCIUM 5 MG PO TABS: 5.0000 mg | ORAL_TABLET | Freq: Every day | ORAL | 5 refills | Status: DC

## 2021-11-02 NOTE — Progress Notes (Signed)
Patient ID: Kim Brown                 DOB: 05-26-1951                    MRN: [id number]     HPI: Kim Brown is a 70 y.o. female patient referred to lipid clinic by Dr Irish Lack. PMH is significant for HTN, CAD s/p anterior MI 03/2021 with multiple 90-100% occlusions, tx with 3 DES placed to mid LAD-1, mid LAD-2, and mid Cx. She was started on rosuvastatin 40mg  daily as she previously experienced myalgias on lovastatin. Also with hx of ischemic cardiomyopathy with LVEF 35-45% and chronic systolic HF, follow up echo with improvement in EF to 55-60% on 07/2021 echo.  Pt presents today with her husband for follow up. Has trouble with chronic fatigue although it's been worse since she had her MI in April. Noticed lethargy and brain fog on rosuvastatin which improved a bit when she stopped it, still notes these symptoms today. Experienced myalgias on prior statins (lovastatin, pravastatin, Vytorin), and hasn't noticed those with the rosuvastatin, primarily the lethargy and brain fog although neither have completely resolved off of the statin. Held her Plavix for a few days as she thought it was contributing to fatigue. Did notice some improvement in symptoms although she restarted the med given her recent stents. Home BP well controlled with systolic readings [id number] primarily. Last cardiology note mentions pt stopped her beta blocker however pt reports still taking Toprol, she cuts her tablet in half and takes 12.5mg  BID.  Current Medications: none Intolerances: lovastatin (myalgias), Vytorin 10-20mg  daily, rosuvastatin 40mg  daily, pravastatin 10mg  daily Risk Factors: CAD s/p MI, age LDL goal: 70mg /dL  Diet:   Exercise: Walks daily  Family History: Arthritis in her father and mother; Colon polyps in her mother; Diabetes in her brother; Heart disease in her father; Heart failure in her brother; Hypertension in her father and mother; Pancreatic cancer in her brother; Stroke in her father.   Social  History: The patient  reports that she has never smoked. She has never used smokeless tobacco. She reports that she does not drink alcohol and does not use drugs.   Labs: 04/17/21: TC 156, TG 132, HDL 41, LDL 89 03/20/21: TC 240, TG 167, HDL 44, LDL 163 (at time of MI) 11/22/20: TC 253, TG 188, HDL 39.8, LDL 176  Past Medical History:  Diagnosis Date   Allergy    Anemia    Arthritis    feet and hands , neck, shoulder   Coronary artery disease    Dysrhythmia 2006   atrial flutter   Exogenous obesity    Heel spur    Hyperlipidemia    Hypertension    Hypothyroid    Irregular heart beats    Ischemic cardiomyopathy    Palpitations    Prolapsed, anus    uses mag oxide powder nightly    RSD (reflex sympathetic dystrophy)    after surgery on R foot   STEMI (ST elevation myocardial infarction) ([place])    Stress incontinence, female     Current Outpatient Medications on File Prior to Visit  Medication Sig Dispense Refill   Ascorbic Acid (VITAMIN C) 1000 MG tablet Take 1,000 mg by mouth 2 (two) times daily.     aspirin 81 MG EC tablet Take 1 tablet (81 mg total) by mouth daily. 30 tablet 11   b complex vitamins tablet Take 1 tablet by mouth 3 (three) times  a week.     beta carotene 10000 UNIT capsule Take 10,000 Units by mouth daily.     Calcium Carbonate-Vitamin D (CALTRATE 600+D PO) Take 1 tablet by mouth daily.     Cholecalciferol (VITAMIN D) 2000 UNITS CAPS Take 1 capsule by mouth daily.     clopidogrel (PLAVIX) 75 MG tablet Take 1 tablet (75 mg total) by mouth daily. Take 600 mg (8 tablets) tonight as a loading dose, then 75 mg daily (1 tablet) 90 tablet 3   DHEA 25 MG CAPS Take 25 mg by mouth daily.     fish oil-omega-3 fatty acids 1000 MG capsule Take 1 g by mouth daily.     Magnesium 250 MG TABS Take 250 mg by mouth daily.     Melatonin 10 MG TABS Take 10 mg by mouth at bedtime.      metoprolol succinate (TOPROL-XL) 25 MG 24 hr tablet Take 25 mg by mouth daily.     nitroGLYCERIN  (NITROSTAT) 0.4 MG SL tablet Place 1 tablet (0.4 mg total) under the tongue every 5 (five) minutes as needed for chest pain. 25 tablet 3   pantoprazole (PROTONIX) 40 MG tablet Take 1 tablet (40 mg total) by mouth daily. 30 tablet 0   phenylephrine (SUDAFED PE) 10 MG TABS tablet Take 10 mg by mouth every 4 (four) hours as needed (sinus).     Polyethyl Glycol-Propyl Glycol (SYSTANE) 0.4-0.3 % SOLN Place 1 drop into both eyes 2 (two) times daily.     progesterone (PROMETRIUM) 100 MG capsule Take 300 mg by mouth at bedtime.      rosuvastatin (CRESTOR) 40 MG tablet Take 1 tablet (40 mg total) by mouth daily. (Patient not taking: Reported on 09/21/2021) 90 tablet 3   thyroid (ARMOUR) 60 MG tablet Take one tablet in am and one tablet in pm. 60 tablet 0   zinc gluconate 50 MG tablet Take 50 mg by mouth daily.     No current facility-administered medications on file prior to visit.    Allergies  Allergen Reactions   Metoprolol Succinate [Metoprolol] Shortness Of Breath    Tongue swelling, fatigue   Albuterol     Jittery sensation   Lovastatin     Myalgias    Assessment/Plan:  1. Hyperlipidemia - Baseline LDL 160s-170s above goal < 70 given recent MI. Pt is intolerant to multiple statins, most recently tried on rosuvastatin 40mg  daily which caused lethargy and brain fog, although it didn't cause myalgias like prior statins she's tried in the past. Discussed rechallenging with lower dose of rosuvastatin vs trying PCSK9i. Pt prefers to start with rosuvastatin rechallenge, will start at 5mg  daily. Advised pt to call clinic with tolerability issues and can pursue Repatha at that time (copay $90/3 month supply with her H[employer] insurance). I'll call pt in 1 month for tolerability update and will plan to schedule follow up labs at that time.  2. Fatigue - seems to be chronic although has worsened after MI this spring so pt and her husband are concerned it may be medication-related based on timing. Tried stopping  both rosuvastatin and Plavix individually and noticed some improvement, now back on Plavix. Last cardiology note mentions pt stopped Toprol due to dizziness and fatigue although pt reports still taking it, currently splitting dose into 12.5mg  BID. Advised she can try decreasing this to 12.5mg  before bed or stopping to see if symptoms improve. If not, would resume.  Kim Brown, PharmD, BCACP, [place]  [facility] [address], [place], [place] [address] Phone: [phone]; Fax: [phone] 11/02/2021 2:34 PM

## 2021-11-02 NOTE — Patient Instructions (Signed)
It was nice to see you today!  Your LDL cholesterol goal is < 70. It was 163 at the time you had your stents placed, but improved to 89 when you were taking a higher dose of rosuvastatin  Restart lower dose of rosuvastatin 5mg  - 1 tablet once daily  Call Jinny Blossom, PharmD with any tolerability issues [phone] and we can try Repatha instead. Repatha is a subcutaneous injection given once every 2 weeks in the fatty tissue of your stomach or upper outer thigh. It lowers your LDL cholesterol by 60% and helps to lower your chance of having a heart attack or stroke.  Once you are taking cholesterol medication that you tolerate well, we'll plan to recheck your labs 8 weeks later

## 2021-11-08 ENCOUNTER — Other Ambulatory Visit (HOSPITAL_COMMUNITY): Payer: Self-pay

## 2021-11-28 NOTE — Progress Notes (Signed)
Subjective:   Kim Brown is a 70 y.o. female who presents for Medicare Annual (Subsequent) preventive examination.  I connected with Aybree Lanyon today by telephone and verified that I am speaking with the correct person using two identifiers. Location patient: home Location provider: work Persons participating in the virtual visit: patient, Marine scientist.    I discussed the limitations, risks, security and privacy concerns of performing an evaluation and management service by telephone and the availability of in person appointments. I also discussed with the patient that there may be a patient responsible charge related to this service. The patient expressed understanding and verbally consented to this telephonic visit.    Interactive audio and video telecommunications were attempted between this provider and patient, however failed, due to patient having technical difficulties OR patient did not have access to video capability.  We continued and completed visit with audio only.  Some vital signs may be absent or patient reported.   Time Spent with patient on telephone encounter: 25 minutes  Review of Systems     Cardiac Risk Factors include: advanced age (>68men, >29 women);dyslipidemia     Objective:    Today's Vitals   11/30/21 0924  Weight: 155 lb (70.3 kg)  Height: 5\' 4"  (1.626 m)   Body mass index is 26.61 kg/m.  Advanced Directives 11/30/2021 03/28/2021 03/27/2021 03/20/2021 03/20/2021 11/26/2020 07/12/2020  Does Patient Have a Medical Advance Directive? Yes Yes Yes Yes Yes Yes Yes  Type of Paramedic of [place];Living will [place];Living will [place];Living will [place];Living will [place];Living will [place];Living will [place];Living will  Does patient want to make changes to medical advance directive? Yes  (MAU/Ambulatory/Procedural Areas - Information given) No - Patient declined No - Patient declined - - - -  Copy of [place] in Chart? - No - copy requested No - copy requested - - No - copy requested -  Would patient like information on creating a medical advance directive? - - - - - - -    Current Medications (verified) Outpatient Encounter Medications as of 11/30/2021  Medication Sig   Ascorbic Acid (VITAMIN C) 1000 MG tablet Take 1,000 mg by mouth 2 (two) times daily.   aspirin 81 MG EC tablet Take 1 tablet (81 mg total) by mouth daily.   b complex vitamins tablet Take 1 tablet by mouth 3 (three) times a week.   beta carotene 10000 UNIT capsule Take 10,000 Units by mouth daily.   Calcium Carbonate-Vitamin D (CALTRATE 600+D PO) Take 1 tablet by mouth daily.   Cholecalciferol (VITAMIN D) 2000 UNITS CAPS Take 1 capsule by mouth daily.   clopidogrel (PLAVIX) 75 MG tablet Take 1 tablet (75 mg total) by mouth daily. Take 600 mg (8 tablets) tonight as a loading dose, then 75 mg daily (1 tablet)   DHEA 25 MG CAPS Take 25 mg by mouth daily.   fish oil-omega-3 fatty acids 1000 MG capsule Take 1 g by mouth daily.   Magnesium 250 MG TABS Take 250 mg by mouth daily.   Melatonin 10 MG TABS Take 10 mg by mouth at bedtime.    metoprolol succinate (TOPROL-XL) 25 MG 24 hr tablet Take 25 mg by mouth daily.   nitroGLYCERIN (NITROSTAT) 0.4 MG SL tablet Place 1 tablet (0.4 mg total) under the tongue every 5 (five) minutes as needed for chest pain.   pantoprazole (PROTONIX)  40 MG tablet Take 1 tablet (40 mg total) by mouth daily.   phenylephrine (SUDAFED PE) 10 MG TABS tablet Take 10 mg by mouth every 4 (four) hours as needed (sinus).   Polyethyl Glycol-Propyl Glycol (SYSTANE) 0.4-0.3 % SOLN Place 1 drop into both eyes 2 (two) times daily.   progesterone (PROMETRIUM) 100 MG capsule Take 300 mg by mouth at bedtime.    rosuvastatin (CRESTOR) 5 MG tablet Take 1 tablet (5 mg total) by mouth  daily.   thyroid (ARMOUR) 60 MG tablet Take one tablet in am and one tablet in pm.   zinc gluconate 50 MG tablet Take 50 mg by mouth daily.   No facility-administered encounter medications on file as of 11/30/2021.    Allergies (verified) Metoprolol succinate [metoprolol], Albuterol, and Lovastatin   History: Past Medical History:  Diagnosis Date   Allergy    Anemia    Arthritis    feet and hands , neck, shoulder   Coronary artery disease    Dysrhythmia 2006   atrial flutter   Exogenous obesity    Heel spur    Hyperlipidemia    Hypertension    Hypothyroid    Irregular heart beats    Ischemic cardiomyopathy    Palpitations    Prolapsed, anus    uses mag oxide powder nightly    RSD (reflex sympathetic dystrophy)    after surgery on R foot   STEMI (ST elevation myocardial infarction) ([place])    Stress incontinence, female    Past Surgical History:  Procedure Laterality Date   ABDOMINAL HYSTERECTOMY  2005   BUNIONECTOMY     COLONOSCOPY     CORONARY ANGIOPLASTY     CORONARY STENT INTERVENTION N/A 03/20/2021   Procedure: CORONARY STENT INTERVENTION;  Surgeon: Jettie Booze, MD;  Location: [facility];  Service: Cardiovascular;  Laterality: N/A;   CORONARY/GRAFT ACUTE MI REVASCULARIZATION N/A 03/20/2021   Procedure: Coronary/Graft Acute MI Revascularization;  Surgeon: Jettie Booze, MD;  Location: [facility];  Service: Cardiovascular;  Laterality: N/A;   DILATION AND CURETTAGE OF UTERUS     LEFT HEART CATH AND CORONARY ANGIOGRAPHY N/A 03/20/2021   Procedure: LEFT HEART CATH AND CORONARY ANGIOGRAPHY;  Surgeon: Jettie Booze, MD;  Location: [facility];  Service: Cardiovascular;  Laterality: N/A;   REVERSE SHOULDER ARTHROPLASTY Right 07/15/2020   Procedure: REVERSE SHOULDER ARTHROPLASTY;  Surgeon: Tania Ade, MD;  Location: [facility];  Service: Orthopedics;  Laterality: Right;   Family History  Problem Relation Age of Onset    Arthritis Mother    Hypertension Mother    Colon polyps Mother    Heart disease Father    Hypertension Father    Arthritis Father    Stroke Father    Heart failure Brother    Diabetes Brother    Pancreatic cancer Brother    Colon cancer Neg Hx    Breast cancer Neg Hx    Esophageal cancer Neg Hx    Rectal cancer Neg Hx    Stomach cancer Neg Hx    Social History   Socioeconomic History   Marital status: Married    Spouse name: Not on file   Number of children: 3   Years of education: 12   Highest education level: Not on file  Occupational History   Occupation: Retired  Tobacco Use   Smoking status: Never   Smokeless tobacco: Never  Vaping Use   Vaping Use: Never used  Substance and  Sexual Activity   Alcohol use: No   Drug use: No   Sexual activity: Yes  Other Topics Concern   Not on file  Social History Narrative   Married 1972   2 daughters Gari [place] Bells is one) and 1 son   18 grandkids    Occ subs at [employer]   Social Determinants of Radio broadcast assistant Strain: Low Risk    Difficulty of Paying Living Expenses: Not hard at all  Food Insecurity: No Food Insecurity   Worried About Charity fundraiser in the Last Year: Never true   Arboriculturist in the Last Year: Never true  Transportation Needs: No Transportation Needs   Lack of Transportation (Medical): No   Lack of Transportation (Non-Medical): No  Physical Activity: Insufficiently Active   Days of Exercise per Week: 6 days   Minutes of Exercise per Session: 20 min  Stress: No Stress Concern Present   Feeling of Stress : Not at all  Social Connections: Socially Integrated   Frequency of Communication with Friends and Family: More than three times a week   Frequency of Social Gatherings with Friends and Family: More than three times a week   Attends Religious Services: More than 4 times per year   Active Member of [employer] or Organizations: Yes   Attends Music therapist:  More than 4 times per year   Marital Status: Married    Tobacco Counseling Counseling given: Not Answered   Clinical Intake:  Pre-visit preparation completed: Yes  Pain : No/denies pain     BMI - recorded: 26.71 Nutritional Status: BMI 25 -29 Overweight Nutritional Risks: None Diabetes: No  How often do you need to have someone help you when you read instructions, pamphlets, or other written materials from your doctor or pharmacy?: 1 - Never  Diabetic? no  Interpreter Needed?: No  Information entered by :: Orrin Brigham LPN   Activities of Daily Living In your present state of health, do you have any difficulty performing the following activities: 11/30/2021 03/28/2021  Hearing? N N  Vision? N N  Difficulty concentrating or making decisions? N N  Walking or climbing stairs? N N  Dressing or bathing? N N  Doing errands, shopping? N N  Preparing Food and eating ? N -  Using the Toilet? N -  In the past six months, have you accidently leaked urine? N -  Do you have problems with loss of bowel control? N -  Managing your Medications? N -  Managing your Finances? N -  Housekeeping or managing your Housekeeping? N -  Some recent data might be hidden    Patient Care Team: Tonia Ghent, MD as PCP - General (Family Medicine) Jettie Booze, MD as PCP - Cardiology (Cardiology)  Indicate any recent Medical Services you may have received from other than [facility] providers in the past year (date may be approximate).     Assessment:   This is a routine wellness examination for Amalya.  Hearing/Vision screen Hearing Screening - Comments:: No issues Vision Screening - Comments:: Last exam 07/2021, wears glasses , Dr. Pamelia Hoit  Dietary issues and exercise activities discussed: Current Exercise Habits: Home exercise routine, Type of exercise: walking;stretching, Time (Minutes): 20, Frequency (Times/Week): 6, Weekly Exercise (Minutes/Week): 120, Intensity: Moderate,  Exercise limited by: cardiac condition(s)   Goals Addressed             This Visit's Progress    Patient Stated  Would like to maintain current routine        Depression Screen PHQ 2/9 Scores 11/30/2021 07/13/2021 05/31/2021 11/30/2020 11/26/2020 09/05/2019 04/24/2018  PHQ - 2 Score 0 0 0 0 0 0 0  PHQ- 9 Score - - - 0 0 0 0    Fall Risk Fall Risk  11/30/2021 05/31/2021 11/30/2020 11/26/2020 07/13/2020  Falls in the past year? 0 0 0 0 0  Comment - - - - Emmi Telephone Survey: data to providers prior to load  Number falls in past yr: 0 - 0 0 -  Injury with Fall? 0 - 0 0 -  Risk for fall due to : No Fall Risks - - Medication side effect -  Follow up Falls prevention discussed Falls evaluation completed Falls evaluation completed Falls evaluation completed;Falls prevention discussed -    FALL RISK PREVENTION PERTAINING TO THE HOME:  Any stairs in or around the home? Yes  If so, are there any without handrails? Yes  Home free of loose throw rugs in walkways, pet beds, electrical cords, etc? Yes  Adequate lighting in your home to reduce risk of falls? Yes   ASSISTIVE DEVICES UTILIZED TO PREVENT FALLS:  Life alert? No  Use of a cane, walker or w/c? No  Grab bars in the bathroom? Yes  Shower chair or bench in shower? Yes  Elevated toilet seat or a handicapped toilet? Yes   TIMED UP AND GO:  Was the test performed? No , visit completed over the phone.   Cognitive Function: Normal cognitive status assessed by this Nurse Health Advisor. No abnormalities found.   MMSE - Mini Mental State Exam 11/26/2020 09/05/2019 04/24/2018  Orientation to time 5 5 5   Orientation to Place 5 5 5   Registration 3 3 3   Attention/ Calculation 5 5 0  Recall 3 3 3   Language- name 2 objects - - 0  Language- repeat 1 1 1   Language- follow 3 step command - - 3  Language- read & follow direction - - 0  Write a sentence - - 0  Copy design - - 0  Total score - - 20         Immunizations Immunization History  Administered Date(s) Administered   Influenza Split 11/10/2011   Influenza, Seasonal, Injecte, Preservative Fre 11/18/2012   Influenza,inj,Quad PF,6+ Mos 09/15/2019   Pneumococcal Conjugate-13 03/20/2017   Pneumococcal Polysaccharide-23 04/24/2018   Tdap 12/18/2005    TDAP status: Due, Education has been provided regarding the importance of this vaccine. Advised may receive this vaccine at local pharmacy or Health Dept. Aware to provide a copy of the vaccination record if obtained from local pharmacy or Health Dept. Verbalized acceptance and understanding.  Flu Vaccine status: Due, Education has been provided regarding the importance of this vaccine. Advised may receive this vaccine at local pharmacy or Health Dept. Aware to provide a copy of the vaccination record if obtained from local pharmacy or Health Dept. Verbalized acceptance and understanding.  Pneumococcal vaccine status: Up to date  Covid-19 vaccine status: Declined, Education has been provided regarding the importance of this vaccine but patient still declined. Advised may receive this vaccine at local pharmacy or Health Dept.or vaccine clinic. Aware to provide a copy of the vaccination record if obtained from local pharmacy or Health Dept. Verbalized acceptance and understanding.  Qualifies for Shingles Vaccine? Yes   Zostavax completed No   Shingrix Completed?: No.    Education has been provided regarding the importance of this vaccine. Patient  has been advised to call insurance company to determine out of pocket expense if they have not yet received this vaccine. Advised may also receive vaccine at local pharmacy or Health Dept. Verbalized acceptance and understanding.  Screening Tests Health Maintenance  Topic Date Due   Zoster Vaccines- Shingrix (1 of 2) Never done   INFLUENZA VACCINE  07/18/2021   COVID-19 Vaccine (1) 12/12/2021 (Originally 01/26/1952)   TETANUS/TDAP  11/26/2024  (Originally 12/19/2015)   MAMMOGRAM  02/11/2023   COLONOSCOPY (Pts 45-66yrs Insurance coverage will need to be confirmed)  06/01/2027   Pneumonia Vaccine 76+ Years old  Completed   DEXA SCAN  Completed   Hepatitis C Screening  Completed   HPV VACCINES  Aged Out    Health Maintenance  Health Maintenance Due  Topic Date Due   Zoster Vaccines- Shingrix (1 of 2) Never done   INFLUENZA VACCINE  07/18/2021    Colorectal cancer screening: Type of screening: Colonoscopy. Completed 05/31/17. Repeat every 10 years  Mammogram status: Completed 02/11/21. Repeat every year  Bone Density status: Completed 02/11/21. Results reflect: Bone density results: OSTEOPOROSIS. Repeat every 2 years.  Lung Cancer Screening: (Low Dose CT Chest recommended if Age 17-80 years, 30 pack-year currently smoking OR have quit w/in 15years.) does not qualify.     Additional Screening:  Hepatitis C Screening: does qualify; Completed 12/18/06  Vision Screening: Recommended annual ophthalmology exams for early detection of glaucoma and other disorders of the eye. Is the patient up to date with their annual eye exam?  Yes  Who is the provider or what is the name of the office in which the patient attends annual eye exams? Dr. Pamelia Hoit   Dental Screening: Recommended annual dental exams for proper oral hygiene  Community Resource Referral / Chronic Care Management: CRR required this visit?  No   CCM required this visit?  No      Plan:     I have personally reviewed and noted the following in the patient's chart:   Medical and social history Use of alcohol, tobacco or illicit drugs  Current medications and supplements including opioid prescriptions.  Functional ability and status Nutritional status Physical activity Advanced directives List of other physicians Hospitalizations, surgeries, and ER visits in previous 12 months Vitals Screenings to include cognitive, depression, and falls Referrals and  appointments  In addition, I have reviewed and discussed with patient certain preventive protocols, quality metrics, and best practice recommendations. A written personalized care plan for preventive services as well as general preventive health recommendations were provided to patient.   Due to this being a telephonic visit, the after visit summary with patients personalized plan was offered to patient via mail or my-chart. Patient would like to access on my-chart.     Loma Messing, LPN   [phone]   Nurse Health Advisor  Nurse Notes: none

## 2021-11-30 ENCOUNTER — Telehealth: Payer: Self-pay | Admitting: Pharmacist

## 2021-11-30 ENCOUNTER — Ambulatory Visit (INDEPENDENT_AMBULATORY_CARE_PROVIDER_SITE_OTHER): Payer: PPO

## 2021-11-30 VITALS — Ht 64.0 in | Wt 155.0 lb

## 2021-11-30 DIAGNOSIS — E782 Mixed hyperlipidemia: Secondary | ICD-10-CM

## 2021-11-30 DIAGNOSIS — Z0000 Encounter for general adult medical examination without abnormal findings: Secondary | ICD-10-CM

## 2021-11-30 NOTE — Telephone Encounter (Signed)
Called pt to follow up with rosuvastatin tolerability. Rechallenged on 5mg  last month (previously intolerant to 40mg  daily). She is tolerating 5mg  daily well. Does not want to increase her dose yet and does not want to recheck labs. Asked that I call her back in February. Discussed that we ideally want to lower her LDL to goal sooner if possible. She is agreeable to me calling back in January instead. Will discuss increasing rosuvastatin dose then.

## 2021-11-30 NOTE — Patient Instructions (Signed)
Kim Brown , Thank you for taking time to complete your Medicare Wellness Visit. I appreciate your ongoing commitment to your health goals. Please review the following plan we discussed and let me know if I can assist you in the future.   Screening recommendations/referrals: Colonoscopy: up to date, completed 05/31/17, due 06/01/27 Mammogram: up to date, completed 02/11/21, due 02/11/22 Bone Density: up to date, completed 02/11/21, due 02/11/23 Recommended yearly ophthalmology/optometry visit for glaucoma screening and checkup Recommended yearly dental visit for hygiene and checkup  Vaccinations: Influenza vaccine: Due-May obtain vaccine at our office or your local pharmacy. Pneumococcal vaccine: up to date Tdap vaccine: Due last completed 01/03/06-May obtain vaccine at our office or your local pharmacy. Shingles vaccine: Discuss with your local pharmacy Covid-19: newest booster available at your local pharmacy  Advanced directives: Please bring a copy of Living Will and/or Gabbs for your chart.   Conditions/risks identified: see problem list  Next appointment: Follow up in one year for your annual wellness visit 12/05/22 @ 9:45am, this will be a telephone visit   Preventive Care 65 Years and Older, Female Preventive care refers to lifestyle choices and visits with your health care provider that can promote health and wellness. What does preventive care include? A yearly physical exam. This is also called an annual well check. Dental exams once or twice a year. Routine eye exams. Ask your health care provider how often you should have your eyes checked. Personal lifestyle choices, including: Daily care of your teeth and gums. Regular physical activity. Eating a healthy diet. Avoiding tobacco and drug use. Limiting alcohol use. Practicing safe sex. Taking low-dose aspirin every day. Taking vitamin and mineral supplements as recommended by your health care  provider. What happens during an annual well check? The services and screenings done by your health care provider during your annual well check will depend on your age, overall health, lifestyle risk factors, and family history of disease. Counseling  Your health care provider may ask you questions about your: Alcohol use. Tobacco use. Drug use. Emotional well-being. Home and relationship well-being. Sexual activity. Eating habits. History of falls. Memory and ability to understand (cognition). Work and work Statistician. Reproductive health. Screening  You may have the following tests or measurements: Height, weight, and BMI. Blood pressure. Lipid and cholesterol levels. These may be checked every 5 years, or more frequently if you are over 76 years old. Skin check. Lung cancer screening. You may have this screening every year starting at age 85 if you have a 30-pack-year history of smoking and currently smoke or have quit within the past 15 years. Fecal occult blood test (FOBT) of the stool. You may have this test every year starting at age 42. Flexible sigmoidoscopy or colonoscopy. You may have a sigmoidoscopy every 5 years or a colonoscopy every 10 years starting at age 84. Hepatitis C blood test. Hepatitis B blood test. Sexually transmitted disease (STD) testing. Diabetes screening. This is done by checking your blood sugar (glucose) after you have not eaten for a while (fasting). You may have this done every 1-3 years. Bone density scan. This is done to screen for osteoporosis. You may have this done starting at age 54. Mammogram. This may be done every 1-2 years. Talk to your health care provider about how often you should have regular mammograms. Talk with your health care provider about your test results, treatment options, and if necessary, the need for more tests. Vaccines  Your health care provider  may recommend certain vaccines, such as: Influenza vaccine. This is  recommended every year. Tetanus, diphtheria, and acellular pertussis (Tdap, Td) vaccine. You may need a Td booster every 10 years. Zoster vaccine. You may need this after age 13. Pneumococcal 13-valent conjugate (PCV13) vaccine. One dose is recommended after age 41. Pneumococcal polysaccharide (PPSV23) vaccine. One dose is recommended after age 70. Talk to your health care provider about which screenings and vaccines you need and how often you need them. This information is not intended to replace advice given to you by your health care provider. Make sure you discuss any questions you have with your health care provider. Document Released: 12/31/2015 Document Revised: 08/23/2016 Document Reviewed: 10/05/2015 Elsevier Interactive Patient Education  2017 [place] Prevention in the Home Falls can cause injuries. They can happen to people of all ages. There are many things you can do to make your home safe and to help prevent falls. What can I do on the outside of my home? Regularly fix the edges of walkways and driveways and fix any cracks. Remove anything that might make you trip as you walk through a door, such as a raised step or threshold. Trim any bushes or trees on the path to your home. Use bright outdoor lighting. Clear any walking paths of anything that might make someone trip, such as rocks or tools. Regularly check to see if handrails are loose or broken. Make sure that both sides of any steps have handrails. Any raised decks and porches should have guardrails on the edges. Have any leaves, snow, or ice cleared regularly. Use sand or salt on walking paths during winter. Clean up any spills in your garage right away. This includes oil or grease spills. What can I do in the bathroom? Use night lights. Install grab bars by the toilet and in the tub and shower. Do not use towel bars as grab bars. Use non-skid mats or decals in the tub or shower. If you need to sit down in  the shower, use a plastic, non-slip stool. Keep the floor dry. Clean up any water that spills on the floor as soon as it happens. Remove soap buildup in the tub or shower regularly. Attach bath mats securely with double-sided non-slip rug tape. Do not have throw rugs and other things on the floor that can make you trip. What can I do in the bedroom? Use night lights. Make sure that you have a light by your bed that is easy to reach. Do not use any sheets or blankets that are too big for your bed. They should not hang down onto the floor. Have a firm chair that has side arms. You can use this for support while you get dressed. Do not have throw rugs and other things on the floor that can make you trip. What can I do in the kitchen? Clean up any spills right away. Avoid walking on wet floors. Keep items that you use a lot in easy-to-reach places. If you need to reach something above you, use a strong step stool that has a grab bar. Keep electrical cords out of the way. Do not use floor polish or wax that makes floors slippery. If you must use wax, use non-skid floor wax. Do not have throw rugs and other things on the floor that can make you trip. What can I do with my stairs? Do not leave any items on the stairs. Make sure that there are handrails on both sides  of the stairs and use them. Fix handrails that are broken or loose. Make sure that handrails are as long as the stairways. Check any carpeting to make sure that it is firmly attached to the stairs. Fix any carpet that is loose or worn. Avoid having throw rugs at the top or bottom of the stairs. If you do have throw rugs, attach them to the floor with carpet tape. Make sure that you have a light switch at the top of the stairs and the bottom of the stairs. If you do not have them, ask someone to add them for you. What else can I do to help prevent falls? Wear shoes that: Do not have high heels. Have rubber bottoms. Are comfortable  and fit you well. Are closed at the toe. Do not wear sandals. If you use a stepladder: Make sure that it is fully opened. Do not climb a closed stepladder. Make sure that both sides of the stepladder are locked into place. Ask someone to hold it for you, if possible. Clearly mark and make sure that you can see: Any grab bars or handrails. First and last steps. Where the edge of each step is. Use tools that help you move around (mobility aids) if they are needed. These include: Canes. Walkers. Scooters. Crutches. Turn on the lights when you go into a dark area. Replace any light bulbs as soon as they burn out. Set up your furniture so you have a clear path. Avoid moving your furniture around. If any of your floors are uneven, fix them. If there are any pets around you, be aware of where they are. Review your medicines with your doctor. Some medicines can make you feel dizzy. This can increase your chance of falling. Ask your doctor what other things that you can do to help prevent falls. This information is not intended to replace advice given to you by your health care provider. Make sure you discuss any questions you have with your health care provider. Document Released: 09/30/2009 Document Revised: 05/11/2016 Document Reviewed: 01/08/2015 Elsevier Interactive Patient Education  2017 [employer].

## 2021-12-28 NOTE — Addendum Note (Signed)
Addended by: Beckham Capistran E on: 12/28/2021 01:07 PM   Modules accepted: Orders

## 2021-12-28 NOTE — Telephone Encounter (Addendum)
Called pt to follow up. Reports fatigue and nervousness that she attributes to her rosuvastatin. She has tried 4 statins and has had side effects. Discussed trying Repatha instead which pt is hesitant to do. She wishes to have labs rechecked. She will come in on the 24th for this and can revisit options at that time.

## 2022-01-10 ENCOUNTER — Other Ambulatory Visit: Payer: PPO | Admitting: *Deleted

## 2022-01-10 ENCOUNTER — Other Ambulatory Visit: Payer: Self-pay

## 2022-01-10 DIAGNOSIS — E782 Mixed hyperlipidemia: Secondary | ICD-10-CM | POA: Diagnosis not present

## 2022-01-10 LAB — LIPID PANEL
Chol/HDL Ratio: 4.4 ratio (ref 0.0–4.4)
Cholesterol, Total: 186 mg/dL (ref 100–199)
HDL: 42 mg/dL (ref >39)
LDL Chol Calc (NIH): 108 mg/dL — ABNORMAL HIGH (ref 0–99)
Triglycerides: 205 mg/dL — ABNORMAL HIGH (ref 0–149)
VLDL Cholesterol Cal: 36 mg/dL (ref 5–40)

## 2022-01-11 ENCOUNTER — Telehealth: Payer: Self-pay | Admitting: Pharmacist

## 2022-01-11 DIAGNOSIS — E782 Mixed hyperlipidemia: Secondary | ICD-10-CM

## 2022-01-11 DIAGNOSIS — H2513 Age-related nuclear cataract, bilateral: Secondary | ICD-10-CM | POA: Diagnosis not present

## 2022-01-11 DIAGNOSIS — H04123 Dry eye syndrome of bilateral lacrimal glands: Secondary | ICD-10-CM | POA: Diagnosis not present

## 2022-01-11 NOTE — Telephone Encounter (Signed)
Called pt to review lipid panel results, husband answered and stated she is napping. Will try  back tomorrow.  Baseline LDL as high as 160s-170s, goal < 70 given hx of MI. Intolerant to rosuvastatin 40mg  daily, lovastatin, Vytorin 10-20mg  daily, and pravastatin 10mg  daily. Rechallenged on rosuvastatin 5mg  daily last November. Stopped secondary to fatigue and nervousness that she attributed to her rosuvastatin. Discussed trying Repatha but pt was hesitant and wanted labs rechecked first.  Updated lipids show LDL of 108 and TG 205. Will need to see if she resumed her low dose rosuvastatin. Still recommend adding on PCSK9i or could try Nexlizet depending on pt preference.

## 2022-01-12 MED ORDER — EZETIMIBE 10 MG PO TABS: 10.0000 mg | ORAL_TABLET | Freq: Every day | ORAL | 11 refills | Status: DC

## 2022-01-12 NOTE — Telephone Encounter (Signed)
Spoke with pt. States she's still taking rosuvastatin 5mg  but reports fatigue and nervousness still. Discussed trying Repatha or Nexlizet instead. Repatha is Tier 3 on her formulary ($45/1 month or $90/3 months) and Nexlizet is Tier 4 ($100/1 month or $290/3 months). Pt hesitant to try these options, preferred I speak with her husband who helps with her meds. Spoke with him, discussed the above options as well as ezetimibe. Pt agreeable to continue low dose rosuvastatin and add on ezetimibe 10mg  daily. She sees PCP in the next week or so and will discuss other symptoms at that visit, do not think they are coming from her statin. Will recheck lipids in 2 months.

## 2022-02-04 ENCOUNTER — Other Ambulatory Visit: Payer: Self-pay | Admitting: Family Medicine

## 2022-02-04 DIAGNOSIS — E7800 Pure hypercholesterolemia, unspecified: Secondary | ICD-10-CM

## 2022-02-09 ENCOUNTER — Other Ambulatory Visit (INDEPENDENT_AMBULATORY_CARE_PROVIDER_SITE_OTHER): Payer: PPO

## 2022-02-09 ENCOUNTER — Other Ambulatory Visit: Payer: Self-pay

## 2022-02-09 DIAGNOSIS — E7800 Pure hypercholesterolemia, unspecified: Secondary | ICD-10-CM

## 2022-02-09 LAB — TSH: TSH: 0.56 u[IU]/mL (ref 0.35–5.50)

## 2022-02-09 LAB — LIPID PANEL
Cholesterol: 182 mg/dL (ref 0–200)
HDL: 44 mg/dL (ref >39.00)
LDL Cholesterol: 109 mg/dL — ABNORMAL HIGH (ref 0–99)
NonHDL: 138.04
Total CHOL/HDL Ratio: 4
Triglycerides: 145 mg/dL (ref 0.0–149.0)
VLDL: 29 mg/dL (ref 0.0–40.0)

## 2022-02-09 LAB — COMPREHENSIVE METABOLIC PANEL
ALT: 15 U/L (ref 0–35)
AST: 17 U/L (ref 0–37)
Albumin: 4.2 g/dL (ref 3.5–5.2)
Alkaline Phosphatase: 58 U/L (ref 39–117)
BUN: 18 mg/dL (ref 6–23)
CO2: 31 mEq/L (ref 19–32)
Calcium: 9.3 mg/dL (ref 8.4–10.5)
Chloride: 103 mEq/L (ref 96–112)
Creatinine, Ser: 0.93 mg/dL (ref 0.40–1.20)
GFR: 62.26 mL/min (ref >60.00)
Glucose, Bld: 104 mg/dL — ABNORMAL HIGH (ref 70–99)
Potassium: 4.5 mEq/L (ref 3.5–5.1)
Sodium: 139 mEq/L (ref 135–145)
Total Bilirubin: 0.5 mg/dL (ref 0.2–1.2)
Total Protein: 6.3 g/dL (ref 6.0–8.3)

## 2022-02-09 LAB — CBC WITH DIFFERENTIAL/PLATELET
Basophils Absolute: 0 10*3/uL (ref 0.0–0.1)
Basophils Relative: 0.4 % (ref 0.0–3.0)
Eosinophils Absolute: 0.2 10*3/uL (ref 0.0–0.7)
Eosinophils Relative: 2.8 % (ref 0.0–5.0)
HCT: 40.9 % (ref 36.0–46.0)
Hemoglobin: 13.6 g/dL (ref 12.0–15.0)
Lymphocytes Relative: 22.6 % (ref 12.0–46.0)
Lymphs Abs: 1.7 10*3/uL (ref 0.7–4.0)
MCHC: 33.2 g/dL (ref 30.0–36.0)
MCV: 92.3 fl (ref 78.0–100.0)
Monocytes Absolute: 0.5 10*3/uL (ref 0.1–1.0)
Monocytes Relative: 6.2 % (ref 3.0–12.0)
Neutro Abs: 5.1 10*3/uL (ref 1.4–7.7)
Neutrophils Relative %: 68 % (ref 43.0–77.0)
Platelets: 240 10*3/uL (ref 150.0–400.0)
RBC: 4.43 Mil/uL (ref 3.87–5.11)
RDW: 13.6 % (ref 11.5–15.5)
WBC: 7.5 10*3/uL (ref 4.0–10.5)

## 2022-02-13 ENCOUNTER — Encounter: Payer: Self-pay | Admitting: Family Medicine

## 2022-02-13 ENCOUNTER — Ambulatory Visit (INDEPENDENT_AMBULATORY_CARE_PROVIDER_SITE_OTHER): Payer: PPO | Admitting: Family Medicine

## 2022-02-13 ENCOUNTER — Other Ambulatory Visit: Payer: Self-pay

## 2022-02-13 VITALS — BP 120/64 | HR 79 | Temp 98.4°F | Ht 64.0 in | Wt 153.0 lb

## 2022-02-13 DIAGNOSIS — H9209 Otalgia, unspecified ear: Secondary | ICD-10-CM | POA: Diagnosis not present

## 2022-02-13 DIAGNOSIS — R413 Other amnesia: Secondary | ICD-10-CM

## 2022-02-13 DIAGNOSIS — I2510 Atherosclerotic heart disease of native coronary artery without angina pectoris: Secondary | ICD-10-CM | POA: Diagnosis not present

## 2022-02-13 MED ORDER — METOPROLOL SUCCINATE ER 25 MG PO TB24: 12.5000 mg | ORAL_TABLET | Freq: Two times a day (BID) | ORAL | Status: DC

## 2022-02-13 NOTE — Progress Notes (Signed)
This visit occurred during the SARS-CoV-2 public health emergency.  Safety protocols were in place, including screening questions prior to the visit, additional usage of staff PPE, and extensive cleaning of exam room while observing appropriate contact time as indicated for disinfecting solutions.  CAD.  No NTG in use int he last 4 months.  D/w pt.   She has tolerated metoprolol.  No CP.  No SOB.  No BLE edema.  Recent labs d/w pt.  She has routine cards f/u pending.  I will update Dr. Irish Lack about her lipids she was still on crestor but off zetia at time of labs.  She is going to restart zetia.  D/w pt.    She is putting up with right knee and anterior thigh pain.  Pain walking, esp first few steps then it gets better.  Massage helps.     Anxiety d/w pt.  She feels jittery, not as much in the last few weeks.  Episodic, feels weak when it happens, lasts about 30 minutes, then it takes a while to get back to baseline.  It started after the cardiac event.    Memory d/w pt.  Episodic lapses and word searching in conversation.  She'll episodically get details confused.  Still oriented to self and people.  Going on episodically since the cardiac event.  Variable frequency of events in the meantime.    She is noted some posterior neck pain.  She has tried topical liniment.  She has had some left ear pain noted incidentally.  Meds, vitals, and allergies reviewed.   ROS: Per HPI unless specifically indicated in ROS section   GEN: nad, alert and oriented except as below. HEENT: ncat, TM wnl B NECK: supple w/o LA but L SCM slightly sore on palpation.  No bruit. CV: rrr.   PULM: ctab, no inc wob ABD: soft, +bs EXT: no edema SKIN: no acute rash   Oriented to year, month, day, but not the date (27th). Oriented to state, place, county, medical clinic.  3/3 attention.  WORLD---->DLROW 1/3 recall.   Can name objects.   Can follow 3 stage commands.   30 minutes were devoted to patient care in this  encounter (this includes time spent reviewing the patient's file/history, interviewing and examining the patient, counseling/reviewing plan with patient).

## 2022-02-13 NOTE — Patient Instructions (Addendum)
Take care.  Glad to see you. Go to the lab on the way out.   If you have mychart we'll likely use that to update you.    We'll call about seeing the CT up.   I'll update cardiology in the meantime.  I would try to restart zetia.

## 2022-02-14 LAB — VITAMIN B12: Vitamin B-12: 951 pg/mL — ABNORMAL HIGH (ref 211–911)

## 2022-02-20 DIAGNOSIS — H9209 Otalgia, unspecified ear: Secondary | ICD-10-CM | POA: Insufficient documentation

## 2022-02-20 DIAGNOSIS — R413 Other amnesia: Secondary | ICD-10-CM | POA: Insufficient documentation

## 2022-02-20 NOTE — Assessment & Plan Note (Signed)
She was not oriented to the day of the month.  1/3 on recall. ?At this point still okay for outpatient follow-up.  ?Check B12 level and head CT and we will go from there.  Rationale for work-up discussed with patient. ?

## 2022-02-20 NOTE — Assessment & Plan Note (Signed)
Likely eustachian tube dysfunction and she can try Valsalva. ?

## 2022-02-20 NOTE — Assessment & Plan Note (Signed)
Advised her to restart Zetia in addition to Crestor.  Labs discussed with patient.  I will defer follow-up lipid check to cardiology since she has follow-up pending. ? ?Her neck pain appears to be incidental and muscular. ?

## 2022-03-01 NOTE — Progress Notes (Signed)
?  ?Cardiology Office Note ? ? ?Date:  03/03/2022  ? ?ID:  Kim Brown, DOB 08-31-1951, MRN [id number] ? ?PCP:  Tonia Ghent, MD  ? ? ?Chief Complaint  ?Patient presents with  ? Follow-up  ? ?CAD ? ?Wt Readings from Last 3 Encounters:  ?03/03/22 152 lb (68.9 kg)  ?02/13/22 153 lb (69.4 kg)  ?11/30/21 155 lb (70.3 kg)  ?  ? ?  ?History of Present Illness: ?Kim Brown is a 71 y.o. female  With a h/o hyperlipidemia who had an anterior MI in 4/22.  Cath, done a few hours after the [employer] game, showed: ?"Mid LAD-1 lesion is 100% stenosed. After thrombectomy, a drug-eluting stent was successfully placed using a SYNERGY XD 3.0X16, postdilated to 3.75 mm. ?Post intervention, there is a 0% residual stenosis. ?Mid LAD-2 lesion is 95% stenosed. A drug-eluting stent was successfully placed using a SYNERGY XD 2.25X28, postdilated to 2.5 mm. ?Post intervention, there is a 0% residual stenosis. ?Mid Cx lesion is 90% stenosed. A drug-eluting stent was successfully placed using a SYNERGY XD 3.50X16, postdilated to 3.75 mm. ?Post intervention, there is a 0% residual stenosis. ?2nd Diag lesion is 50% stenosed. ?Dist LAD lesion is 25% stenosed. The mid to distal LAD was a very small vessel in general. ?There is mild to moderate left ventricular systolic dysfunction. ?LV end diastolic pressure is moderately elevated. LVEDP 23 mm Hg. ?The left ventricular ejection fraction is 35-45% by visual estimate. ?There is no aortic valve stenosis. ?  ?Continue dual antiplatelet therapy for 12 months.  She will need aggressive secondary prevention.  She had myalgias with lovastatin.  Start rosuvastatin 20 mg daily.  She will need metoprolol and likely an ACE inhibitor depending on her ejection fraction.  We will plan for echocardiogram on Monday.  No IV fluids due to elevated LVEDP in the setting of anterior wall MI and decreased LVEF." ?  ?Admitted with dizziness a week after cath. SHe was given fluid.  She had urinary  retention of 1600 cc. ?  ?Coronary artery disease status post recent angioplasty LAD and circumflex. Ischemic cardiomyopathy, ejection fraction 98-92% chronic systolic heart failure. ?She was continued on dual antiplatelet therapy with aspirin and ticagrelor. ?Continue beta-blockade with metoprolol, avoiding ACE or ARB due to risk of hypotension. ?  ?Post MI: "She has had persistent fatigue and other such symptoms since her MI.  Normal BNP.  Repeat echocardiogram showed improvement of LVEF. ?She describes daily anxiety.  She walks daily.  No exertional chest discomfort." ? ?She has had some intermittent chest pain at random times.  Not related to exertion.  Now has a knee problem after using an exercise machine.   ? ? ? ?Past Medical History:  ?Diagnosis Date  ? Allergy   ? Anemia   ? Arthritis   ? feet and hands , neck, shoulder  ? Coronary artery disease   ? Dysrhythmia 2006  ? atrial flutter  ? Exogenous obesity   ? Heel spur   ? Hyperlipidemia   ? Hypertension   ? Hypothyroid   ? Irregular heart beats   ? Ischemic cardiomyopathy   ? Palpitations   ? Prolapsed, anus   ? uses mag oxide powder nightly   ? RSD (reflex sympathetic dystrophy)   ? after surgery on R foot  ? STEMI (ST elevation myocardial infarction) ([place])   ? Stress incontinence, female   ? ? ?Past Surgical History:  ?Procedure Laterality Date  ? ABDOMINAL HYSTERECTOMY  2005  ? BUNIONECTOMY    ? COLONOSCOPY    ? CORONARY ANGIOPLASTY    ? CORONARY STENT INTERVENTION N/A 03/20/2021  ? Procedure: CORONARY STENT INTERVENTION;  Surgeon: Jettie Booze, MD;  Location: [facility];  Service: Cardiovascular;  Laterality: N/A;  ? CORONARY/GRAFT ACUTE MI REVASCULARIZATION N/A 03/20/2021  ? Procedure: Coronary/Graft Acute MI Revascularization;  Surgeon: Jettie Booze, MD;  Location: [facility];  Service: Cardiovascular;  Laterality: N/A;  ? DILATION AND CURETTAGE OF UTERUS    ? LEFT HEART CATH AND CORONARY ANGIOGRAPHY N/A 03/20/2021  ?  Procedure: LEFT HEART CATH AND CORONARY ANGIOGRAPHY;  Surgeon: Jettie Booze, MD;  Location: [facility];  Service: Cardiovascular;  Laterality: N/A;  ? REVERSE SHOULDER ARTHROPLASTY Right 07/15/2020  ? Procedure: REVERSE SHOULDER ARTHROPLASTY;  Surgeon: Tania Ade, MD;  Location: [facility];  Service: Orthopedics;  Laterality: Right;  ? ? ? ?Current Outpatient Medications  ?Medication Sig Dispense Refill  ? Ascorbic Acid (VITAMIN C) 1000 MG tablet Take 1,000 mg by mouth daily.    ? aspirin 81 MG EC tablet Take 1 tablet (81 mg total) by mouth daily. 30 tablet 11  ? b complex vitamins tablet Take 1 tablet by mouth daily.    ? beta carotene 10000 UNIT capsule Take 10,000 Units by mouth daily.    ? Calcium Carbonate-Vitamin D (CALTRATE 600+D PO) Take 1 tablet by mouth daily.    ? Cholecalciferol (VITAMIN D) 2000 UNITS CAPS Take 1 capsule by mouth daily.    ? clopidogrel (PLAVIX) 75 MG tablet Take 1 tablet (75 mg total) by mouth daily. Take 600 mg (8 tablets) tonight as a loading dose, then 75 mg daily (1 tablet) 90 tablet 3  ? DHEA 25 MG CAPS Take 25 mg by mouth daily.    ? ezetimibe (ZETIA) 10 MG tablet Take 1 tablet (10 mg total) by mouth daily. 30 tablet 11  ? fish oil-omega-3 fatty acids 1000 MG capsule Take 1 g by mouth daily.    ? Magnesium 250 MG TABS Take 250 mg by mouth daily.    ? Melatonin 10 MG TABS Take 10 mg by mouth at bedtime.     ? metoprolol succinate (TOPROL-XL) 25 MG 24 hr tablet Take 0.5 tablets (12.5 mg total) by mouth in the morning and at bedtime.    ? nitroGLYCERIN (NITROSTAT) 0.4 MG SL tablet Place 1 tablet (0.4 mg total) under the tongue every 5 (five) minutes as needed for chest pain. 25 tablet 3  ? phenylephrine (SUDAFED PE) 10 MG TABS tablet Take 10 mg by mouth every 4 (four) hours as needed (sinus).    ? Polyethyl Glycol-Propyl Glycol (SYSTANE) 0.4-0.3 % SOLN Place 1 drop into both eyes 2 (two) times daily.    ? progesterone (PROMETRIUM) 100 MG capsule Take 300 mg by mouth  at bedtime.     ? rosuvastatin (CRESTOR) 5 MG tablet Take 1 tablet (5 mg total) by mouth daily. 30 tablet 5  ? thyroid (ARMOUR) 60 MG tablet Take one tablet in am and one tablet in pm. 60 tablet 0  ? zinc gluconate 50 MG tablet Take 50 mg by mouth daily.    ? ?No current facility-administered medications for this visit.  ? ? ?Allergies:   Albuterol, Lovastatin, Pravastatin, Rosuvastatin, and Vytorin [ezetimibe-simvastatin]  ? ? ?Social History:  The patient  reports that she has never smoked. She has never used smokeless tobacco. She reports that she does not  drink alcohol and does not use drugs.  ? ?Family History:  The patient's family history includes Arthritis in her father and mother; Colon polyps in her mother; Diabetes in her brother; Heart disease in her father; Heart failure in her brother; Hypertension in her father and mother; Pancreatic cancer in her brother; Stroke in her father.  ? ? ?ROS:  Please see the history of present illness.   Otherwise, review of systems are positive for memory decreased.   All other systems are reviewed and negative.  ? ? ?PHYSICAL EXAM: ?VS:  BP 110/70   Pulse 79   Ht 5' 4.5" (1.638 m)   Wt 152 lb (68.9 kg)   SpO2 99%   BMI 25.69 kg/m?  , BMI Body mass index is 25.69 kg/m?. ?GEN: Well nourished, well developed, in no acute distress ?HEENT: normal ?Neck: no JVD, carotid bruits, or masses ?Cardiac: RRR; no murmurs, rubs, or gallops,no edema  ?Respiratory:  clear to auscultation bilaterally, normal work of breathing ?GI: soft, nontender, nondistended, + BS ?MS: no deformity or atrophy ?Skin: warm and dry, no rash ?Neuro:  Strength and sensation are intact ?Psych: euthymic mood, full affect ? ? ?EKG:   ?The ekg ordered today demonstrates NSR, no ST changes ? ? ?Recent Labs: ?03/20/2021: Magnesium 2.1 ?03/27/2021: B Natriuretic Peptide 161.8 ?09/07/2021: NT-Pro BNP 124 ?02/09/2022: BUN 18; Creatinine, Ser 0.93; Hemoglobin 13.6; Platelets 240.0; Potassium 4.5; Sodium 139; TSH  0.56 ?03/02/2022: ALT 17  ? ?Lipid Panel ?   ?Component Value Date/Time  ? CHOL 138 03/02/2022 1130  ? TRIG 145 03/02/2022 1130  ? HDL 42 03/02/2022 1130  ? CHOLHDL 3.3 03/02/2022 1130  ? CHOLHDL 4 02/09/2022 093

## 2022-03-02 ENCOUNTER — Other Ambulatory Visit: Payer: PPO

## 2022-03-02 ENCOUNTER — Other Ambulatory Visit: Payer: Self-pay

## 2022-03-02 DIAGNOSIS — E782 Mixed hyperlipidemia: Secondary | ICD-10-CM | POA: Diagnosis not present

## 2022-03-02 LAB — LIPID PANEL
Chol/HDL Ratio: 3.3 ratio (ref 0.0–4.4)
Cholesterol, Total: 138 mg/dL (ref 100–199)
HDL: 42 mg/dL (ref >39)
LDL Chol Calc (NIH): 71 mg/dL (ref 0–99)
Triglycerides: 145 mg/dL (ref 0–149)
VLDL Cholesterol Cal: 25 mg/dL (ref 5–40)

## 2022-03-02 LAB — HEPATIC FUNCTION PANEL
ALT: 17 IU/L (ref 0–32)
AST: 16 IU/L (ref 0–40)
Albumin: 4.3 g/dL (ref 3.8–4.8)
Alkaline Phosphatase: 70 IU/L (ref 44–121)
Bilirubin Total: 0.3 mg/dL (ref 0.0–1.2)
Bilirubin, Direct: 0.11 mg/dL (ref 0.00–0.40)
Total Protein: 6.5 g/dL (ref 6.0–8.5)

## 2022-03-03 ENCOUNTER — Encounter: Payer: Self-pay | Admitting: Interventional Cardiology

## 2022-03-03 ENCOUNTER — Ambulatory Visit: Payer: PPO | Admitting: Interventional Cardiology

## 2022-03-03 VITALS — BP 110/70 | HR 79 | Ht 64.5 in | Wt 152.0 lb

## 2022-03-03 DIAGNOSIS — R0602 Shortness of breath: Secondary | ICD-10-CM

## 2022-03-03 DIAGNOSIS — T466X5D Adverse effect of antihyperlipidemic and antiarteriosclerotic drugs, subsequent encounter: Secondary | ICD-10-CM

## 2022-03-03 DIAGNOSIS — E782 Mixed hyperlipidemia: Secondary | ICD-10-CM | POA: Diagnosis not present

## 2022-03-03 DIAGNOSIS — T466X5A Adverse effect of antihyperlipidemic and antiarteriosclerotic drugs, initial encounter: Secondary | ICD-10-CM

## 2022-03-03 DIAGNOSIS — G720 Drug-induced myopathy: Secondary | ICD-10-CM

## 2022-03-03 DIAGNOSIS — I25118 Atherosclerotic heart disease of native coronary artery with other forms of angina pectoris: Secondary | ICD-10-CM | POA: Diagnosis not present

## 2022-03-03 DIAGNOSIS — R002 Palpitations: Secondary | ICD-10-CM | POA: Diagnosis not present

## 2022-03-03 NOTE — Patient Instructions (Signed)
Medication Instructions:  ?Your physician recommends that you continue on your current medications as directed. Please refer to the Current Medication list given to you today. ? ?*If you need a refill on your cardiac medications before your next appointment, please call your pharmacy* ? ? ?Lab Work: ?none ?If you have labs (blood work) drawn today and your tests are completely normal, you will receive your results only by: ?MyChart Message (if you have MyChart) OR ?A paper copy in the mail ?If you have any lab test that is abnormal or we need to change your treatment, we will call you to review the results. ? ? ?Testing/Procedures: ?none ? ? ?Follow-Up: ?At [facility], you and your health needs are our priority.  As part of our continuing mission to provide you with exceptional heart care, we have created designated Provider Care Teams.  These Care Teams include your primary Cardiologist (physician) and Advanced Practice Providers (APPs -  Physician Assistants and Nurse Practitioners) who all work together to provide you with the care you need, when you need it. ? ?We recommend signing up for the patient portal called "MyChart".  Sign up information is provided on this After Visit Summary.  MyChart is used to connect with patients for Virtual Visits (Telemedicine).  Patients are able to view lab/test results, encounter notes, upcoming appointments, etc.  Non-urgent messages can be sent to your provider as well.   ?To learn more about what you can do with MyChart, go to [email or web address].   ? ?Your next appointment:   ?September 07, 2022 at 11:20 ? ?The format for your next appointment:   ?In Person ? ?Provider:   ?Larae Grooms, MD   ? ? ?Other Instructions ? ? ?

## 2022-03-06 DIAGNOSIS — M9905 Segmental and somatic dysfunction of pelvic region: Secondary | ICD-10-CM | POA: Diagnosis not present

## 2022-03-06 DIAGNOSIS — M9904 Segmental and somatic dysfunction of sacral region: Secondary | ICD-10-CM | POA: Diagnosis not present

## 2022-03-06 DIAGNOSIS — M9903 Segmental and somatic dysfunction of lumbar region: Secondary | ICD-10-CM | POA: Diagnosis not present

## 2022-03-06 DIAGNOSIS — M9902 Segmental and somatic dysfunction of thoracic region: Secondary | ICD-10-CM | POA: Diagnosis not present

## 2022-03-10 ENCOUNTER — Ambulatory Visit
Admission: RE | Admit: 2022-03-10 | Discharge: 2022-03-10 | Disposition: A | Discharge status: Home / Self Care | Payer: PPO | Source: Ambulatory Visit | Attending: Family Medicine | Admitting: Family Medicine

## 2022-03-10 ENCOUNTER — Other Ambulatory Visit: Payer: Self-pay

## 2022-03-10 DIAGNOSIS — R4182 Altered mental status, unspecified: Secondary | ICD-10-CM | POA: Diagnosis not present

## 2022-03-10 DIAGNOSIS — R413 Other amnesia: Secondary | ICD-10-CM | POA: Diagnosis not present

## 2022-03-15 ENCOUNTER — Other Ambulatory Visit: Payer: PPO

## 2022-03-15 DIAGNOSIS — M9905 Segmental and somatic dysfunction of pelvic region: Secondary | ICD-10-CM | POA: Diagnosis not present

## 2022-03-15 DIAGNOSIS — M9904 Segmental and somatic dysfunction of sacral region: Secondary | ICD-10-CM | POA: Diagnosis not present

## 2022-03-15 DIAGNOSIS — M9902 Segmental and somatic dysfunction of thoracic region: Secondary | ICD-10-CM | POA: Diagnosis not present

## 2022-03-15 DIAGNOSIS — M9903 Segmental and somatic dysfunction of lumbar region: Secondary | ICD-10-CM | POA: Diagnosis not present

## 2022-03-20 DIAGNOSIS — M9903 Segmental and somatic dysfunction of lumbar region: Secondary | ICD-10-CM | POA: Diagnosis not present

## 2022-03-20 DIAGNOSIS — M9905 Segmental and somatic dysfunction of pelvic region: Secondary | ICD-10-CM | POA: Diagnosis not present

## 2022-03-20 DIAGNOSIS — M9902 Segmental and somatic dysfunction of thoracic region: Secondary | ICD-10-CM | POA: Diagnosis not present

## 2022-03-20 DIAGNOSIS — M9904 Segmental and somatic dysfunction of sacral region: Secondary | ICD-10-CM | POA: Diagnosis not present

## 2022-03-27 DIAGNOSIS — M9903 Segmental and somatic dysfunction of lumbar region: Secondary | ICD-10-CM | POA: Diagnosis not present

## 2022-03-27 DIAGNOSIS — M9905 Segmental and somatic dysfunction of pelvic region: Secondary | ICD-10-CM | POA: Diagnosis not present

## 2022-03-27 DIAGNOSIS — M9904 Segmental and somatic dysfunction of sacral region: Secondary | ICD-10-CM | POA: Diagnosis not present

## 2022-03-27 DIAGNOSIS — M9902 Segmental and somatic dysfunction of thoracic region: Secondary | ICD-10-CM | POA: Diagnosis not present

## 2022-04-03 DIAGNOSIS — M9903 Segmental and somatic dysfunction of lumbar region: Secondary | ICD-10-CM | POA: Diagnosis not present

## 2022-04-03 DIAGNOSIS — M9905 Segmental and somatic dysfunction of pelvic region: Secondary | ICD-10-CM | POA: Diagnosis not present

## 2022-04-03 DIAGNOSIS — M9902 Segmental and somatic dysfunction of thoracic region: Secondary | ICD-10-CM | POA: Diagnosis not present

## 2022-04-03 DIAGNOSIS — M9904 Segmental and somatic dysfunction of sacral region: Secondary | ICD-10-CM | POA: Diagnosis not present

## 2022-04-11 ENCOUNTER — Ambulatory Visit: Payer: PPO | Admitting: Physician Assistant

## 2022-04-17 DIAGNOSIS — M9902 Segmental and somatic dysfunction of thoracic region: Secondary | ICD-10-CM | POA: Diagnosis not present

## 2022-04-17 DIAGNOSIS — M9905 Segmental and somatic dysfunction of pelvic region: Secondary | ICD-10-CM | POA: Diagnosis not present

## 2022-04-17 DIAGNOSIS — M9904 Segmental and somatic dysfunction of sacral region: Secondary | ICD-10-CM | POA: Diagnosis not present

## 2022-04-17 DIAGNOSIS — M9903 Segmental and somatic dysfunction of lumbar region: Secondary | ICD-10-CM | POA: Diagnosis not present

## 2022-05-02 ENCOUNTER — Other Ambulatory Visit: Payer: Self-pay | Admitting: Interventional Cardiology

## 2022-05-22 DIAGNOSIS — M9902 Segmental and somatic dysfunction of thoracic region: Secondary | ICD-10-CM | POA: Diagnosis not present

## 2022-05-22 DIAGNOSIS — M9905 Segmental and somatic dysfunction of pelvic region: Secondary | ICD-10-CM | POA: Diagnosis not present

## 2022-05-22 DIAGNOSIS — M9903 Segmental and somatic dysfunction of lumbar region: Secondary | ICD-10-CM | POA: Diagnosis not present

## 2022-05-22 DIAGNOSIS — M9904 Segmental and somatic dysfunction of sacral region: Secondary | ICD-10-CM | POA: Diagnosis not present

## 2022-06-26 ENCOUNTER — Ambulatory Visit (INDEPENDENT_AMBULATORY_CARE_PROVIDER_SITE_OTHER): Payer: PPO | Admitting: Family Medicine

## 2022-06-26 ENCOUNTER — Encounter: Payer: Self-pay | Admitting: Family Medicine

## 2022-06-26 VITALS — BP 122/62 | HR 76 | Temp 97.9°F | Ht 64.5 in | Wt 155.0 lb

## 2022-06-26 DIAGNOSIS — M542 Cervicalgia: Secondary | ICD-10-CM | POA: Diagnosis not present

## 2022-06-26 DIAGNOSIS — R413 Other amnesia: Secondary | ICD-10-CM

## 2022-06-26 DIAGNOSIS — I2510 Atherosclerotic heart disease of native coronary artery without angina pectoris: Secondary | ICD-10-CM | POA: Diagnosis not present

## 2022-06-26 MED ORDER — CLOPIDOGREL BISULFATE 75 MG PO TABS: 75.0000 mg | ORAL_TABLET | Freq: Every day | ORAL | Status: AC

## 2022-06-26 NOTE — Progress Notes (Unsigned)
Still putting up with fatigue.  She has noted fluttering in her chest and then feels fatigued after that, tired for about 8 hours.  No syncope.  No CP.  She feels a flutter.  It happens a few times a month, irregular.  Fluttering can last a few seconds.  She had prev monitor done.  Needs to update Dr. Irish Lack.    Meds, vitals, and allergies reviewed.   ROS: Per HPI unless specifically indicated in ROS section   L SCM and occipital sx.  D/w pt ice and changing pillow.    Need to order neurocog testing.

## 2022-06-26 NOTE — Patient Instructions (Signed)
Try ice and changing pillows.   Let me update cardiology and see about the memory testing in the meantime.  Don't change your meds yet.  Take care.  Glad to see you.

## 2022-06-28 DIAGNOSIS — M9903 Segmental and somatic dysfunction of lumbar region: Secondary | ICD-10-CM | POA: Diagnosis not present

## 2022-06-28 DIAGNOSIS — M9902 Segmental and somatic dysfunction of thoracic region: Secondary | ICD-10-CM | POA: Diagnosis not present

## 2022-06-28 DIAGNOSIS — M9904 Segmental and somatic dysfunction of sacral region: Secondary | ICD-10-CM | POA: Diagnosis not present

## 2022-06-28 DIAGNOSIS — M9905 Segmental and somatic dysfunction of pelvic region: Secondary | ICD-10-CM | POA: Diagnosis not present

## 2022-06-28 DIAGNOSIS — M542 Cervicalgia: Secondary | ICD-10-CM | POA: Insufficient documentation

## 2022-06-28 NOTE — Assessment & Plan Note (Signed)
Refer for neurocognitive testing.  Rationale discussed with patient.

## 2022-06-28 NOTE — Assessment & Plan Note (Signed)
She has fatigue and she noted some fluttering in her chest and I need cardiology input.  I am routing this to Dr. Irish Lack (with appreciation) for input in the meantime.  I advised her not to change her beta-blocker yet.

## 2022-06-28 NOTE — Assessment & Plan Note (Signed)
Neck is sore but not stiff.  Discussed icing as needed and changing pillows.  I do not suspect an ominous process.

## 2022-07-04 ENCOUNTER — Encounter: Payer: Self-pay | Admitting: *Deleted

## 2022-07-24 DIAGNOSIS — M9902 Segmental and somatic dysfunction of thoracic region: Secondary | ICD-10-CM | POA: Diagnosis not present

## 2022-07-24 DIAGNOSIS — M9905 Segmental and somatic dysfunction of pelvic region: Secondary | ICD-10-CM | POA: Diagnosis not present

## 2022-07-24 DIAGNOSIS — M9903 Segmental and somatic dysfunction of lumbar region: Secondary | ICD-10-CM | POA: Diagnosis not present

## 2022-07-24 DIAGNOSIS — M9904 Segmental and somatic dysfunction of sacral region: Secondary | ICD-10-CM | POA: Diagnosis not present

## 2022-07-26 NOTE — Telephone Encounter (Signed)
Received notification that Dr. Arlyn Leak office is not in network with [facility].

## 2022-07-30 ENCOUNTER — Encounter: Payer: Self-pay | Admitting: Family Medicine

## 2022-07-31 DIAGNOSIS — R21 Rash and other nonspecific skin eruption: Secondary | ICD-10-CM | POA: Diagnosis not present

## 2022-08-08 DIAGNOSIS — Z1231 Encounter for screening mammogram for malignant neoplasm of breast: Secondary | ICD-10-CM | POA: Diagnosis not present

## 2022-08-08 LAB — HM MAMMOGRAPHY

## 2022-08-09 DIAGNOSIS — Z09 Encounter for follow-up examination after completed treatment for conditions other than malignant neoplasm: Secondary | ICD-10-CM | POA: Diagnosis not present

## 2022-08-09 DIAGNOSIS — M25511 Pain in right shoulder: Secondary | ICD-10-CM | POA: Diagnosis not present

## 2022-08-09 DIAGNOSIS — Z96611 Presence of right artificial shoulder joint: Secondary | ICD-10-CM | POA: Diagnosis not present

## 2022-08-10 ENCOUNTER — Other Ambulatory Visit: Payer: Self-pay | Admitting: Interventional Cardiology

## 2022-08-15 DIAGNOSIS — R928 Other abnormal and inconclusive findings on diagnostic imaging of breast: Secondary | ICD-10-CM | POA: Diagnosis not present

## 2022-08-15 DIAGNOSIS — R922 Inconclusive mammogram: Secondary | ICD-10-CM | POA: Diagnosis not present

## 2022-08-15 LAB — HM MAMMOGRAPHY

## 2022-08-17 ENCOUNTER — Encounter: Payer: Self-pay | Admitting: Family Medicine

## 2022-08-23 DIAGNOSIS — M9905 Segmental and somatic dysfunction of pelvic region: Secondary | ICD-10-CM | POA: Diagnosis not present

## 2022-08-23 DIAGNOSIS — M9904 Segmental and somatic dysfunction of sacral region: Secondary | ICD-10-CM | POA: Diagnosis not present

## 2022-08-23 DIAGNOSIS — M9903 Segmental and somatic dysfunction of lumbar region: Secondary | ICD-10-CM | POA: Diagnosis not present

## 2022-08-23 DIAGNOSIS — M9902 Segmental and somatic dysfunction of thoracic region: Secondary | ICD-10-CM | POA: Diagnosis not present

## 2022-09-05 NOTE — Progress Notes (Unsigned)
Cardiology Office Note   Date:  09/07/2022   ID:  Kim, Brown 1951/07/27, MRN [id number]  PCP:  Tonia Ghent, MD    No chief complaint on file.  CAD  Wt Readings from Last 3 Encounters:  09/07/22 152 lb (68.9 kg)  06/26/22 155 lb (70.3 kg)  03/03/22 152 lb (68.9 kg)       History of Present Illness: Kim Brown is a 71 y.o. female   With a h/o hyperlipidemia who had an anterior MI in 4/22.  Cath, done a few hours after the [place] game, showed: "Mid LAD-1 lesion is 100% stenosed. After thrombectomy, a drug-eluting stent was successfully placed using a SYNERGY XD 3.0X16, postdilated to 3.75 mm. Post intervention, there is a 0% residual stenosis. Mid LAD-2 lesion is 95% stenosed. A drug-eluting stent was successfully placed using a SYNERGY XD 2.25X28, postdilated to 2.5 mm. Post intervention, there is a 0% residual stenosis. Mid Cx lesion is 90% stenosed. A drug-eluting stent was successfully placed using a SYNERGY XD 3.50X16, postdilated to 3.75 mm. Post intervention, there is a 0% residual stenosis. 2nd Diag lesion is 50% stenosed. Dist LAD lesion is 25% stenosed. The mid to distal LAD was a very small vessel in general. There is mild to moderate left ventricular systolic dysfunction. LV end diastolic pressure is moderately elevated. LVEDP 23 mm Hg. The left ventricular ejection fraction is 35-45% by visual estimate. There is no aortic valve stenosis.   Continue dual antiplatelet therapy for 12 months.  She will need aggressive secondary prevention.  She had myalgias with lovastatin.  Start rosuvastatin 20 mg daily.  She will need metoprolol and likely an ACE inhibitor depending on her ejection fraction.  We will plan for echocardiogram on Monday.  No IV fluids due to elevated LVEDP in the setting of anterior wall MI and decreased LVEF."   Admitted with dizziness a week after cath. SHe was given fluid.  She had urinary retention of 1600 cc.    Coronary artery disease status post recent angioplasty LAD and circumflex. Ischemic cardiomyopathy, ejection fraction 40-98% chronic systolic heart failure. She was continued on dual antiplatelet therapy with aspirin and ticagrelor. Continue beta-blockade with metoprolol, avoiding ACE or ARB due to risk of hypotension.   Post MI: "She has had persistent fatigue and other such symptoms since her MI.  Normal BNP.  Repeat echocardiogram showed improvement of LVEF. She describes daily anxiety.  She walks daily.  No exertional chest discomfort."  Still having palpitations.   Monitor: "Normal sinus rhythm with rare PACs, PVCs.  Rare brief runs of PACs and PVCs which occasionally correlate to symptoms.  No atrial fibrillation.  No sustained arrhythmias."  Has had some back pain.  Sometimes associated with brushing.  No problems with regular activities.  She hurt her knee during physical therapy.  This has limited walking somewhat.  We discussed potentially trying water aerobics as this may be easier on her joints.    Past Medical History:  Diagnosis Date   Allergy    Anemia    Arthritis    feet and hands , neck, shoulder   Coronary artery disease    Dysrhythmia 2006   atrial flutter   Exogenous obesity    Heel spur    Hyperlipidemia    Hypertension    Hypothyroid    Irregular heart beats    Ischemic cardiomyopathy    Palpitations    Prolapsed, anus    uses mag  oxide powder nightly    RSD (reflex sympathetic dystrophy)    after surgery on R foot   STEMI (ST elevation myocardial infarction) ([place])    Stress incontinence, female     Past Surgical History:  Procedure Laterality Date   ABDOMINAL HYSTERECTOMY  2005   BUNIONECTOMY     COLONOSCOPY     CORONARY ANGIOPLASTY     CORONARY STENT INTERVENTION N/A 03/20/2021   Procedure: CORONARY STENT INTERVENTION;  Surgeon: Jettie Booze, MD;  Location: [facility];  Service: Cardiovascular;  Laterality: N/A;    CORONARY/GRAFT ACUTE MI REVASCULARIZATION N/A 03/20/2021   Procedure: Coronary/Graft Acute MI Revascularization;  Surgeon: Jettie Booze, MD;  Location: [facility];  Service: Cardiovascular;  Laterality: N/A;   DILATION AND CURETTAGE OF UTERUS     LEFT HEART CATH AND CORONARY ANGIOGRAPHY N/A 03/20/2021   Procedure: LEFT HEART CATH AND CORONARY ANGIOGRAPHY;  Surgeon: Jettie Booze, MD;  Location: [facility];  Service: Cardiovascular;  Laterality: N/A;   REVERSE SHOULDER ARTHROPLASTY Right 07/15/2020   Procedure: REVERSE SHOULDER ARTHROPLASTY;  Surgeon: Tania Ade, MD;  Location: [facility];  Service: Orthopedics;  Laterality: Right;     Current Outpatient Medications  Medication Sig Dispense Refill   Ascorbic Acid (VITAMIN C) 1000 MG tablet Take 1,000 mg by mouth daily.     b complex vitamins tablet Take 1 tablet by mouth daily.     beta carotene 10000 UNIT capsule Take 10,000 Units by mouth daily.     Calcium Carbonate-Vitamin D (CALTRATE 600+D PO) Take 1 tablet by mouth daily.     Cholecalciferol (VITAMIN D) 2000 UNITS CAPS Take 1 capsule by mouth daily.     clopidogrel (PLAVIX) 75 MG tablet Take 1 tablet (75 mg total) by mouth daily.     DHEA 25 MG CAPS Take 25 mg by mouth daily.     estradiol (CLIMARA - DOSED IN MG/24 HR) 0.05 mg/24hr patch 0.05 mg once a week.     ezetimibe (ZETIA) 10 MG tablet Take 5 mg by mouth daily.     fish oil-omega-3 fatty acids 1000 MG capsule Take 1 g by mouth daily.     Magnesium 250 MG TABS Take 250 mg by mouth daily.     Melatonin 10 MG TABS Take 10 mg by mouth at bedtime.      metoprolol succinate (TOPROL-XL) 25 MG 24 hr tablet TAKE 1 TABLET BY MOUTH DAILY 90 tablet 1   nitroGLYCERIN (NITROSTAT) 0.4 MG SL tablet Place 1 tablet (0.4 mg total) under the tongue every 5 (five) minutes as needed for chest pain. 25 tablet 3   Polyethyl Glycol-Propyl Glycol (SYSTANE) 0.4-0.3 % SOLN Place 1 drop into both eyes 2 (two) times daily.      progesterone (PROMETRIUM) 100 MG capsule Take 300 mg by mouth at bedtime.      rosuvastatin (CRESTOR) 5 MG tablet TAKE 1 TABLET BY MOUTH DAILY 30 tablet 7   thyroid (ARMOUR) 60 MG tablet Take one tablet in am and one tablet in pm. 60 tablet 0   zinc gluconate 50 MG tablet Take 50 mg by mouth daily.     No current facility-administered medications for this visit.    Allergies:   Albuterol, Lovastatin, Pravastatin, Rosuvastatin, and Vytorin [ezetimibe-simvastatin]    Social History:  The patient  reports that she has never smoked. She has never used smokeless tobacco. She reports that she does not drink alcohol and does not use  drugs.   Family History:  The patient's family history includes Arthritis in her father and mother; Colon polyps in her mother; Diabetes in her brother; Heart disease in her father; Heart failure in her brother; Hypertension in her father and mother; Pancreatic cancer in her brother; Stroke in her father.    ROS:  Please see the history of present illness.   Otherwise, review of systems are positive for fatigue.   All other systems are reviewed and negative.    PHYSICAL EXAM: VS:  BP 110/68   Pulse 79   Ht 5' 4.5" (1.638 m)   Wt 152 lb (68.9 kg)   SpO2 99%   BMI 25.69 kg/m  , BMI Body mass index is 25.69 kg/m. GEN: Well nourished, well developed, in no acute distress HEENT: normal Neck: no JVD, carotid bruits, or masses Cardiac: RRR; no murmurs, rubs, or gallops,no edema  Respiratory:  clear to auscultation bilaterally, normal work of breathing GI: soft, nontender, nondistended, + BS MS: no deformity or atrophy Skin: warm and dry, no rash Neuro:  Strength and sensation are intact Psych: euthymic mood, full affect   EKG:   The ekg ordered today demonstrates NSR, no ST changes   Recent Labs: 09/07/2021: NT-Pro BNP 124 02/09/2022: BUN 18; Creatinine, Ser 0.93; Hemoglobin 13.6; Platelets 240.0; Potassium 4.5; Sodium 139; TSH 0.56 03/02/2022: ALT 17    Lipid Panel    Component Value Date/Time   CHOL 138 03/02/2022 1130   TRIG 145 03/02/2022 1130   HDL 42 03/02/2022 1130   CHOLHDL 3.3 03/02/2022 1130   CHOLHDL 4 02/09/2022 0936   VLDL 29.0 02/09/2022 0936   LDLCALC 71 03/02/2022 1130   LDLDIRECT 184.0 09/05/2019 1120     Other studies Reviewed: Additional studies/ records that were reviewed today with results demonstrating: labs reviewed: prior monitor reviewed.   ASSESSMENT AND PLAN:  CAD/Old MI: No clear angina.  Has some back pain.   She can do her daily activities without problems.  When she gets in a rush, she may feel some discomfort.  It is not like what she had at the time of the MI.  We discussed pharmacologic stress testing.  She will think about it and let us know if they want to proceed. Palpitations: Lasting just seconds.  Can have some weakness afterwards.  Frequency has decreased.   Hyperlipidemia: LDL 71 on low-dose Crestor and Zetia. Dizziness: Can occur after the episodes of palpitations.  She feels very weak at that time. Memory disturbance: Persistent. They want to stop meds.  We will try her off of Toprol for 2 weeks to see if memory improves.  If no change, restart metoprolol.  I did counsel that PVCs and PACs which she had in 2022 may increase off of the metoprolol.  Could try her off of the low-dose rosuvastatin, but long-term this may increase her risk of vascular issues.  If she feels better off of the statin, would refer to lipid clinic for other options.  Continue Zetia. Statin intolerance: LDL 71 on low-dose Crestor.  She would be hesitant to try high-dose Crestor as indicated by prior MRI.   Current medicines are reviewed at length with the patient today.  The patient concerns regarding her medicines were addressed.  The following changes have been made:  No change  Labs/ tests ordered today include:  No orders of the defined types were placed in this encounter.   Recommend 150 minutes/week of  aerobic exercise Low fat, low carb, high  fiber diet recommended  Disposition:   FU in 1 year   Signed, Larae Grooms, MD  09/07/2022 11:54 AM    [facility] [address], [place], [place]  [address] Phone: [phone]; Fax: [phone]

## 2022-09-07 ENCOUNTER — Encounter: Payer: Self-pay | Admitting: Interventional Cardiology

## 2022-09-07 ENCOUNTER — Ambulatory Visit: Payer: PPO | Attending: Interventional Cardiology | Admitting: Interventional Cardiology

## 2022-09-07 VITALS — BP 110/68 | HR 79 | Ht 64.5 in | Wt 152.0 lb

## 2022-09-07 DIAGNOSIS — T466X5D Adverse effect of antihyperlipidemic and antiarteriosclerotic drugs, subsequent encounter: Secondary | ICD-10-CM | POA: Diagnosis not present

## 2022-09-07 DIAGNOSIS — R0602 Shortness of breath: Secondary | ICD-10-CM | POA: Diagnosis not present

## 2022-09-07 DIAGNOSIS — G720 Drug-induced myopathy: Secondary | ICD-10-CM | POA: Diagnosis not present

## 2022-09-07 DIAGNOSIS — I25118 Atherosclerotic heart disease of native coronary artery with other forms of angina pectoris: Secondary | ICD-10-CM

## 2022-09-07 DIAGNOSIS — E782 Mixed hyperlipidemia: Secondary | ICD-10-CM | POA: Diagnosis not present

## 2022-09-07 MED ORDER — NITROGLYCERIN 0.4 MG SL SUBL: 0.4000 mg | SUBLINGUAL_TABLET | SUBLINGUAL | 2 refills | Status: DC | PRN

## 2022-09-07 NOTE — Patient Instructions (Addendum)
Medication Instructions:  Your physician has recommended you make the following change in your medication:  Hold Toprol XL/Metoprolol for 2 weeks and let us know how she is feeling.   *If you need a refill on your cardiac medications before your next appointment, please call your pharmacy*  Follow-Up: At [facility], you and your health needs are our priority.  As part of our continuing mission to provide you with exceptional heart care, we have created designated Provider Care Teams.  These Care Teams include your primary Cardiologist (physician) and Advanced Practice Providers (APPs -  Physician Assistants and Nurse Practitioners) who all work together to provide you with the care you need, when you need it.  Your next appointment:   6 month(s)  The format for your next appointment:   In Person  Provider:   Larae Grooms, MD     Other Instructions: If palpitations increase in frequency let us know

## 2022-09-18 DIAGNOSIS — M9905 Segmental and somatic dysfunction of pelvic region: Secondary | ICD-10-CM | POA: Diagnosis not present

## 2022-09-18 DIAGNOSIS — M9902 Segmental and somatic dysfunction of thoracic region: Secondary | ICD-10-CM | POA: Diagnosis not present

## 2022-09-18 DIAGNOSIS — M9904 Segmental and somatic dysfunction of sacral region: Secondary | ICD-10-CM | POA: Diagnosis not present

## 2022-09-18 DIAGNOSIS — M9903 Segmental and somatic dysfunction of lumbar region: Secondary | ICD-10-CM | POA: Diagnosis not present

## 2022-10-11 ENCOUNTER — Telehealth: Payer: Self-pay

## 2022-10-11 NOTE — Patient Outreach (Signed)
  Care Coordination   Initial Visit Note   10/11/2022 Name: Kim Brown MRN: [id number] DOB: [birth year]  Kim Brown is a 71 y.o. year old female who sees Tonia Ghent, MD for primary care. I spoke with  Glenna Fellows by phone today.  What matters to the patients health and wellness today?  Patient states she has no nursing and/ or community resource needs.     Goals Addressed             This Visit's Progress    COMPLETED: care coordination activities - no follow up needed       Care Coordination Interventions: Care coordination program/ services discussed  Social determinants of health survey completed Patient advised to contact primary care provider office  if care coordination services needed in the future           SDOH assessments and interventions completed:  Yes  SDOH Interventions Today    Flowsheet Row Most Recent Value  SDOH Interventions   Food Insecurity Interventions Intervention Not Indicated  Housing Interventions Intervention Not Indicated  Transportation Interventions Intervention Not Indicated        Care Coordination Interventions Activated:  Yes  Care Coordination Interventions:  Yes, provided   Follow up plan: No further intervention required  Encounter Outcome:  Pt. Visit Completed   Quinn Plowman RN,BSN,CCM [place] [phone] direct line

## 2022-11-12 ENCOUNTER — Other Ambulatory Visit: Payer: Self-pay | Admitting: Interventional Cardiology

## 2022-12-25 ENCOUNTER — Ambulatory Visit (INDEPENDENT_AMBULATORY_CARE_PROVIDER_SITE_OTHER): Payer: PPO

## 2022-12-25 VITALS — Ht 64.5 in | Wt 152.0 lb

## 2022-12-25 DIAGNOSIS — Z0000 Encounter for general adult medical examination without abnormal findings: Secondary | ICD-10-CM | POA: Diagnosis not present

## 2022-12-25 NOTE — Patient Instructions (Signed)
Kim Brown , Thank you for taking time to come for your Medicare Wellness Visit. I appreciate your ongoing commitment to your health goals. Please review the following plan we discussed and let me know if I can assist you in the future.   Screening recommendations/referrals: Colonoscopy: 05/31/17 Mammogram: 08/15/22 Bone Density: 02/11/21 Recommended yearly ophthalmology/optometry visit for glaucoma screening and checkup Recommended yearly dental visit for hygiene and checkup  Vaccinations: Influenza vaccine: 09/15/19 Pneumococcal vaccine: 04/24/18 Tdap vaccine: 12/18/05, due if have injury Shingles vaccine: n/d   Covid-19:n/d  Advanced directives: no  Conditions/risks identified: none  Next appointment: Follow up in one year for your annual wellness visit 12/27/23 @ 9:30 am by phone   Preventive Care 65 Years and Older, Female Preventive care refers to lifestyle choices and visits with your health care provider that can promote health and wellness. What does preventive care include? A yearly physical exam. This is also called an annual well check. Dental exams once or twice a year. Routine eye exams. Ask your health care provider how often you should have your eyes checked. Personal lifestyle choices, including: Daily care of your teeth and gums. Regular physical activity. Eating a healthy diet. Avoiding tobacco and drug use. Limiting alcohol use. Practicing safe sex. Taking low-dose aspirin every day. Taking vitamin and mineral supplements as recommended by your health care provider. What happens during an annual well check? The services and screenings done by your health care provider during your annual well check will depend on your age, overall health, lifestyle risk factors, and family history of disease. Counseling  Your health care provider may ask you questions about your: Alcohol use. Tobacco use. Drug use. Emotional well-being. Home and relationship well-being. Sexual  activity. Eating habits. History of falls. Memory and ability to understand (cognition). Work and work Statistician. Reproductive health. Screening  You may have the following tests or measurements: Height, weight, and BMI. Blood pressure. Lipid and cholesterol levels. These may be checked every 5 years, or more frequently if you are over 77 years old. Skin check. Lung cancer screening. You may have this screening every year starting at age 60 if you have a 30-pack-year history of smoking and currently smoke or have quit within the past 15 years. Fecal occult blood test (FOBT) of the stool. You may have this test every year starting at age 80. Flexible sigmoidoscopy or colonoscopy. You may have a sigmoidoscopy every 5 years or a colonoscopy every 10 years starting at age 60. Hepatitis C blood test. Hepatitis B blood test. Sexually transmitted disease (STD) testing. Diabetes screening. This is done by checking your blood sugar (glucose) after you have not eaten for a while (fasting). You may have this done every 1-3 years. Bone density scan. This is done to screen for osteoporosis. You may have this done starting at age 50. Mammogram. This may be done every 1-2 years. Talk to your health care provider about how often you should have regular mammograms. Talk with your health care provider about your test results, treatment options, and if necessary, the need for more tests. Vaccines  Your health care provider may recommend certain vaccines, such as: Influenza vaccine. This is recommended every year. Tetanus, diphtheria, and acellular pertussis (Tdap, Td) vaccine. You may need a Td booster every 10 years. Zoster vaccine. You may need this after age 5. Pneumococcal 13-valent conjugate (PCV13) vaccine. One dose is recommended after age 58. Pneumococcal polysaccharide (PPSV23) vaccine. One dose is recommended after age 32. Talk to your  health care provider about which screenings and vaccines  you need and how often you need them. This information is not intended to replace advice given to you by your health care provider. Make sure you discuss any questions you have with your health care provider. Document Released: 12/31/2015 Document Revised: 08/23/2016 Document Reviewed: 10/05/2015 Elsevier Interactive Patient Education  2017 [employer] Prevention in the Home Falls can cause injuries. They can happen to people of all ages. There are many things you can do to make your home safe and to help prevent falls. What can I do on the outside of my home? Regularly fix the edges of walkways and driveways and fix any cracks. Remove anything that might make you trip as you walk through a door, such as a raised step or threshold. Trim any bushes or trees on the path to your home. Use bright outdoor lighting. Clear any walking paths of anything that might make someone trip, such as rocks or tools. Regularly check to see if handrails are loose or broken. Make sure that both sides of any steps have handrails. Any raised decks and porches should have guardrails on the edges. Have any leaves, snow, or ice cleared regularly. Use sand or salt on walking paths during winter. Clean up any spills in your garage right away. This includes oil or grease spills. What can I do in the bathroom? Use night lights. Install grab bars by the toilet and in the tub and shower. Do not use towel bars as grab bars. Use non-skid mats or decals in the tub or shower. If you need to sit down in the shower, use a plastic, non-slip stool. Keep the floor dry. Clean up any water that spills on the floor as soon as it happens. Remove soap buildup in the tub or shower regularly. Attach bath mats securely with double-sided non-slip rug tape. Do not have throw rugs and other things on the floor that can make you trip. What can I do in the bedroom? Use night lights. Make sure that you have a light by your bed that  is easy to reach. Do not use any sheets or blankets that are too big for your bed. They should not hang down onto the floor. Have a firm chair that has side arms. You can use this for support while you get dressed. Do not have throw rugs and other things on the floor that can make you trip. What can I do in the kitchen? Clean up any spills right away. Avoid walking on wet floors. Keep items that you use a lot in easy-to-reach places. If you need to reach something above you, use a strong step stool that has a grab bar. Keep electrical cords out of the way. Do not use floor polish or wax that makes floors slippery. If you must use wax, use non-skid floor wax. Do not have throw rugs and other things on the floor that can make you trip. What can I do with my stairs? Do not leave any items on the stairs. Make sure that there are handrails on both sides of the stairs and use them. Fix handrails that are broken or loose. Make sure that handrails are as long as the stairways. Check any carpeting to make sure that it is firmly attached to the stairs. Fix any carpet that is loose or worn. Avoid having throw rugs at the top or bottom of the stairs. If you do have throw rugs, attach them  to the floor with carpet tape. Make sure that you have a light switch at the top of the stairs and the bottom of the stairs. If you do not have them, ask someone to add them for you. What else can I do to help prevent falls? Wear shoes that: Do not have high heels. Have rubber bottoms. Are comfortable and fit you well. Are closed at the toe. Do not wear sandals. If you use a stepladder: Make sure that it is fully opened. Do not climb a closed stepladder. Make sure that both sides of the stepladder are locked into place. Ask someone to hold it for you, if possible. Clearly mark and make sure that you can see: Any grab bars or handrails. First and last steps. Where the edge of each step is. Use tools that help you  move around (mobility aids) if they are needed. These include: Canes. Walkers. Scooters. Crutches. Turn on the lights when you go into a dark area. Replace any light bulbs as soon as they burn out. Set up your furniture so you have a clear path. Avoid moving your furniture around. If any of your floors are uneven, fix them. If there are any pets around you, be aware of where they are. Review your medicines with your doctor. Some medicines can make you feel dizzy. This can increase your chance of falling. Ask your doctor what other things that you can do to help prevent falls. This information is not intended to replace advice given to you by your health care provider. Make sure you discuss any questions you have with your health care provider. Document Released: 09/30/2009 Document Revised: 05/11/2016 Document Reviewed: 01/08/2015 Elsevier Interactive Patient Education  2017 [employer].

## 2022-12-25 NOTE — Progress Notes (Signed)
Virtual Visit via Telephone Note  I connected with  Kim Brown on 12/25/22 at 11:30 AM EST by telephone and verified that I am speaking with the correct person using two identifiers.  Location: Patient: home Provider: [place] Persons participating in the virtual visit: [place]   I discussed the limitations, risks, security and privacy concerns of performing an evaluation and management service by telephone and the availability of in person appointments. The patient expressed understanding and agreed to proceed.  Interactive audio and video telecommunications were attempted between this nurse and patient, however failed, due to patient having technical difficulties OR patient did not have access to video capability.  We continued and completed visit with audio only.  Some vital signs may be absent or patient reported.   Dionisio David, LPN  Subjective:   Aldona Bryner is a 72 y.o. female who presents for Medicare Annual (Subsequent) preventive examination.  Review of Systems     Cardiac Risk Factors include: advanced age (>95mn, >>39women);dyslipidemia     Objective:    There were no vitals filed for this visit. There is no height or weight on file to calculate BMI.     12/25/2022   11:23 AM 11/30/2021    9:33 AM 03/28/2021    4:00 AM 03/27/2021    7:46 PM 03/20/2021    4:00 AM 03/20/2021    2:12 AM 11/26/2020   11:03 AM  Advanced Directives  Does Patient Have a Medical Advance Directive? No Yes Yes Yes Yes Yes Yes  Type of ASocial research officer, governmentLiving will HSt. EdwardLiving will [place]Living will HN[place]af[place]Living will [place]Living will [place]oudonvilleLiving will  Does patient want to make changes to medical advance directive?  Yes (MAU/Ambulatory/Procedural Areas - Information given) No - Patient declined No - Patient declined      Copy of [place] Chart?   No - copy requested No - copy requested   No - copy requested  Would patient like information on creating a medical advance directive? No - Patient declined          Current Medications (verified) Outpatient Encounter Medications as of 12/25/2022  Medication Sig   Ascorbic Acid (VITAMIN C) 1000 MG tablet Take 1,000 mg by mouth daily.   b complex vitamins tablet Take 1 tablet by mouth daily.   Cholecalciferol (VITAMIN D) 2000 UNITS CAPS Take 1 capsule by mouth daily.   clopidogrel (PLAVIX) 75 MG tablet Take 1 tablet (75 mg total) by mouth daily.   estradiol (CLIMARA - DOSED IN MG/24 HR) 0.05 mg/24hr patch 0.05 mg once a week.   ezetimibe (ZETIA) 10 MG tablet Take 5 mg by mouth daily.   fish oil-omega-3 fatty acids 1000 MG capsule Take 1 g by mouth daily.   Magnesium 250 MG TABS Take 250 mg by mouth daily.   metoprolol succinate (TOPROL-XL) 25 MG 24 hr tablet TAKE 1 TABLET BY MOUTH DAILY   nitroGLYCERIN (NITROSTAT) 0.4 MG SL tablet Place 1 tablet (0.4 mg total) under the tongue every 5 (five) minutes as needed for chest pain.   Polyethyl Glycol-Propyl Glycol (SYSTANE) 0.4-0.3 % SOLN Place 1 drop into both eyes 2 (two) times daily.   progesterone (PROMETRIUM) 100 MG capsule Take 300 mg by mouth at bedtime.    rosuvastatin (CRESTOR) 5 MG tablet TAKE 1 TABLET BY MOUTH DAILY   thyroid (ARMOUR) 60  MG tablet Take one tablet in am and one tablet in pm.   zinc gluconate 50 MG tablet Take 50 mg by mouth daily.   beta carotene 10000 UNIT capsule Take 10,000 Units by mouth daily. (Patient not taking: Reported on 12/25/2022)   Calcium Carbonate-Vitamin D (CALTRATE 600+D PO) Take 1 tablet by mouth daily. (Patient not taking: Reported on 12/25/2022)   DHEA 25 MG CAPS Take 25 mg by mouth daily. (Patient not taking: Reported on 12/25/2022)   Melatonin 10 MG TABS Take 10 mg by mouth at bedtime.  (Patient not taking: Reported on 12/25/2022)   No  facility-administered encounter medications on file as of 12/25/2022.    Allergies (verified) Albuterol, Lovastatin, Pravastatin, Rosuvastatin, and Vytorin [ezetimibe-simvastatin]   History: Past Medical History:  Diagnosis Date   Allergy    Anemia    Arthritis    feet and hands , neck, shoulder   Coronary artery disease    Dysrhythmia 2006   atrial flutter   Exogenous obesity    Heel spur    Hyperlipidemia    Hypertension    Hypothyroid    Irregular heart beats    Ischemic cardiomyopathy    Palpitations    Prolapsed, anus    uses mag oxide powder nightly    RSD (reflex sympathetic dystrophy)    after surgery on R foot   STEMI (ST elevation myocardial infarction) ([place])    Stress incontinence, female    Past Surgical History:  Procedure Laterality Date   ABDOMINAL HYSTERECTOMY  2005   BUNIONECTOMY     COLONOSCOPY     CORONARY ANGIOPLASTY     CORONARY STENT INTERVENTION N/A 03/20/2021   Procedure: CORONARY STENT INTERVENTION;  Surgeon: Jettie Booze, MD;  Location: [facility];  Service: Cardiovascular;  Laterality: N/A;   CORONARY/GRAFT ACUTE MI REVASCULARIZATION N/A 03/20/2021   Procedure: Coronary/Graft Acute MI Revascularization;  Surgeon: Jettie Booze, MD;  Location: [facility];  Service: Cardiovascular;  Laterality: N/A;   DILATION AND CURETTAGE OF UTERUS     LEFT HEART CATH AND CORONARY ANGIOGRAPHY N/A 03/20/2021   Procedure: LEFT HEART CATH AND CORONARY ANGIOGRAPHY;  Surgeon: Jettie Booze, MD;  Location: [facility];  Service: Cardiovascular;  Laterality: N/A;   REVERSE SHOULDER ARTHROPLASTY Right 07/15/2020   Procedure: REVERSE SHOULDER ARTHROPLASTY;  Surgeon: Tania Ade, MD;  Location: [facility];  Service: Orthopedics;  Laterality: Right;   Family History  Problem Relation Age of Onset   Arthritis Mother    Hypertension Mother    Colon polyps Mother    Heart disease Father    Hypertension Father    Arthritis  Father    Stroke Father    Heart failure Brother    Diabetes Brother    Pancreatic cancer Brother    Colon cancer Neg Hx    Breast cancer Neg Hx    Esophageal cancer Neg Hx    Rectal cancer Neg Hx    Stomach cancer Neg Hx    Social History   Socioeconomic History   Marital status: Married    Spouse name: Not on file   Number of children: 3   Years of education: 12   Highest education level: Not on file  Occupational History   Occupation: Retired  Tobacco Use   Smoking status: Never   Smokeless tobacco: Never  Vaping Use   Vaping Use: Never used  Substance and Sexual Activity   Alcohol use: No   Drug  use: No   Sexual activity: Yes  Other Topics Concern   Not on file  Social History Narrative   Married 1972   2 daughters Gari [place] Bells is one) and 1 son   57 grandkids    Occ subs at [employer] Determinants of [employer]: Low Risk  (12/25/2022)   Overall Financial Resource Strain (CARDIA)    Difficulty of Paying Living Expenses: Not hard at all  Food Insecurity: No Food Insecurity (12/25/2022)   Hunger Vital Sign    Worried About Running Out of Food in the Last Year: Never true    Ran Out of Food in the Last Year: Never true  Transportation Needs: No Transportation Needs (12/25/2022)   PRAPARE - Hydrologist (Medical): No    Lack of Transportation (Non-Medical): No  Physical Activity: Insufficiently Active (12/25/2022)   Exercise Vital Sign    Days of Exercise per Week: 5 days    Minutes of Exercise per Session: 20 min  Stress: No Stress Concern Present (12/25/2022)   [place]    Feeling of Stress : Not at all  Social Connections: [place] (12/25/2022)   Social Connection and Isolation Panel [NHANES]    Frequency of Communication with Friends and Family: More than three times a week    Frequency of Social Gatherings with  Friends and Family: Once a week    Attends Religious Services: More than 4 times per year    Active Member of [employer] or Organizations: Yes    Attends Music therapist: More than 4 times per year    Marital Status: Married    Tobacco Counseling Counseling given: Not Answered   Clinical Intake:  Pre-visit preparation completed: Yes  Pain : No/denies pain     Nutritional Risks: None Diabetes: No  How often do you need to have someone help you when you read instructions, pamphlets, or other written materials from your doctor or pharmacy?: 1 - Never  Diabetic?no  Interpreter Needed?: No  Information entered by :: Kirke Shaggy, LPN   Activities of Daily Living    12/25/2022   11:23 AM  In your present state of health, do you have any difficulty performing the following activities:  Hearing? 0  Vision? 0  Difficulty concentrating or making decisions? 0  Walking or climbing stairs? 0  Dressing or bathing? 0  Doing errands, shopping? 0  Preparing Food and eating ? N  Using the Toilet? N  In the past six months, have you accidently leaked urine? N  Do you have problems with loss of bowel control? N  Managing your Medications? N  Managing your Finances? N  Housekeeping or managing your Housekeeping? N    Patient Care Team: Tonia Ghent, MD as PCP - General (Family Medicine) Jettie Booze, MD as PCP - Cardiology (Cardiology)  Indicate any recent Medical Services you may have received from other than [facility] providers in the past year (date may be approximate).     Assessment:   This is a routine wellness examination for Lidiya.  Hearing/Vision screen Hearing Screening - Comments:: No aids Vision Screening - Comments:: Wears glasses- Dr.Abelott  Dietary issues and exercise activities discussed: Current Exercise Habits: Home exercise routine, Type of exercise: walking, Time (Minutes): 20, Frequency (Times/Week): 5, Weekly Exercise  (Minutes/Week): 100, Intensity: Mild   Goals Addressed  This Visit's Progress    DIET - EAT MORE FRUITS AND VEGETABLES         Depression Screen    12/25/2022   11:21 AM 11/30/2021    9:37 AM 07/13/2021    2:23 PM 05/31/2021    3:40 PM 11/30/2020    9:37 AM 11/26/2020   11:04 AM 09/05/2019    2:29 PM  PHQ 2/9 Scores  PHQ - 2 Score 0 0 0 0 0 0 0  PHQ- 9 Score 0    0 0 0    Fall Risk    12/25/2022   11:23 AM 11/30/2021    9:35 AM 05/31/2021    1:31 PM 11/30/2020    9:37 AM 11/26/2020   11:04 AM  Fall Risk   Falls in the past year? 0 0 0 0 0  Number falls in past yr: 0 0  0 0  Injury with Fall? 0 0  0 0  Risk for fall due to : No Fall Risks No Fall Risks   Medication side effect  Follow up Falls prevention discussed;Falls evaluation completed Falls prevention discussed Falls evaluation completed Falls evaluation completed Falls evaluation completed;Falls prevention discussed    FALL RISK PREVENTION PERTAINING TO THE HOME:  Any stairs in or around the home? Yes  If so, are there any without handrails? No  Home free of loose throw rugs in walkways, pet beds, electrical cords, etc? Yes  Adequate lighting in your home to reduce risk of falls? Yes   ASSISTIVE DEVICES UTILIZED TO PREVENT FALLS:  Life alert? No  Use of a cane, walker or w/c? No  Grab bars in the bathroom? Yes  Shower chair or bench in shower? Yes  Elevated toilet seat or a handicapped toilet? Yes    Cognitive Function:    11/26/2020   11:05 AM 09/05/2019    2:30 PM 04/24/2018   12:48 PM  MMSE - Mini Mental State Exam  Orientation to time '5 5 5  '$ Orientation to Place '5 5 5  '$ Registration '3 3 3  '$ Attention/ Calculation 5 5 0  Recall '3 3 3  '$ Language- name 2 objects   0  Language- repeat '1 1 1  '$ Language- follow 3 step command   3  Language- read & follow direction   0  Write a sentence   0  Copy design   0  Total score   20        12/25/2022   11:26 AM  6CIT Screen  What Year? 0 points   What month? 0 points  What time? 3 points  Count back from 20 0 points  Months in reverse 0 points  Repeat phrase 6 points  Total Score 9 points    Immunizations Immunization History  Administered Date(s) Administered   Influenza Split 11/10/2011   Influenza, Seasonal, Injecte, Preservative Fre 11/18/2012   Influenza,inj,Quad PF,6+ Mos 09/15/2019   Pneumococcal Conjugate-13 03/20/2017   Pneumococcal Polysaccharide-23 04/24/2018   Tdap 12/18/2005    TDAP status: Due, Education has been provided regarding the importance of this vaccine. Advised may receive this vaccine at local pharmacy or Health Dept. Aware to provide a copy of the vaccination record if obtained from local pharmacy or Health Dept. Verbalized acceptance and understanding.  Flu Vaccine status: Declined, Education has been provided regarding the importance of this vaccine but patient still declined. Advised may receive this vaccine at local pharmacy or Health Dept. Aware to provide a copy of the vaccination record  if obtained from local pharmacy or Health Dept. Verbalized acceptance and understanding.  Pneumococcal vaccine status: Up to date  Covid-19 vaccine status: Declined, Education has been provided regarding the importance of this vaccine but patient still declined. Advised may receive this vaccine at local pharmacy or Health Dept.or vaccine clinic. Aware to provide a copy of the vaccination record if obtained from local pharmacy or Health Dept. Verbalized acceptance and understanding.  Qualifies for Shingles Vaccine? Yes   Zostavax completed No   Shingrix Completed?: No.    Education has been provided regarding the importance of this vaccine. Patient has been advised to call insurance company to determine out of pocket expense if they have not yet received this vaccine. Advised may also receive vaccine at local pharmacy or Health Dept. Verbalized acceptance and understanding.  Screening Tests Health Maintenance   Topic Date Due   COVID-19 Vaccine (1) Never done   Zoster Vaccines- Shingrix (1 of 2) Never done   DTaP/Tdap/Td (2 - Td or Tdap) 12/19/2015   INFLUENZA VACCINE  07/18/2022   Medicare Annual Wellness (AWV)  12/26/2023   MAMMOGRAM  08/15/2024   COLONOSCOPY (Pts 45-26yr Insurance coverage will need to be confirmed)  06/01/2027   Pneumonia Vaccine 72 Years old  Completed   DEXA SCAN  Completed   Hepatitis C Screening  Completed   HPV VACCINES  Aged Out    Health Maintenance  Health Maintenance Due  Topic Date Due   COVID-19 Vaccine (1) Never done   Zoster Vaccines- Shingrix (1 of 2) Never done   DTaP/Tdap/Td (2 - Td or Tdap) 12/19/2015   INFLUENZA VACCINE  07/18/2022    Colorectal cancer screening: Type of screening: Colonoscopy. Completed 05/31/17. Repeat every 10 years  Mammogram status: Completed 08/15/22. Repeat every year  Bone Density status: Completed 02/11/21. Results reflect: Bone density results: NORMAL. Repeat every 5 years.  Lung Cancer Screening: (Low Dose CT Chest recommended if Age 72-80years, 30 pack-year currently smoking OR have quit w/in 15years.) does not qualify.   Additional Screening:  Hepatitis C Screening: does qualify; Completed 12/18/06  Vision Screening: Recommended annual ophthalmology exams for early detection of glaucoma and other disorders of the eye. Is the patient up to date with their annual eye exam?  Yes  Who is the provider or what is the name of the office in which the patient attends annual eye exams? Dr.Abelott If pt is not established with a provider, would they like to be referred to a provider to establish care? No .   Dental Screening: Recommended annual dental exams for proper oral hygiene  Community Resource Referral / Chronic Care Management: CRR required this visit?  No   CCM required this visit?  No      Plan:     I have personally reviewed and noted the following in the patient's chart:   Medical and social  history Use of alcohol, tobacco or illicit drugs  Current medications and supplements including opioid prescriptions. Patient is not currently taking opioid prescriptions. Functional ability and status Nutritional status Physical activity Advanced directives List of other physicians Hospitalizations, surgeries, and ER visits in previous 12 months Vitals Screenings to include cognitive, depression, and falls Referrals and appointments  In addition, I have reviewed and discussed with patient certain preventive protocols, quality metrics, and best practice recommendations. A written personalized care plan for preventive services as well as general preventive health recommendations were provided to patient.     LDionisio David LPN  12/25/2022   Nurse Notes: none

## 2023-01-15 ENCOUNTER — Encounter: Payer: PPO | Admitting: Family Medicine

## 2023-01-22 ENCOUNTER — Ambulatory Visit (INDEPENDENT_AMBULATORY_CARE_PROVIDER_SITE_OTHER): Payer: PPO | Admitting: Family Medicine

## 2023-01-22 ENCOUNTER — Encounter: Payer: Self-pay | Admitting: Family Medicine

## 2023-01-22 VITALS — BP 118/80 | HR 92 | Temp 97.2°F | Ht 64.0 in | Wt 154.0 lb

## 2023-01-22 DIAGNOSIS — I2510 Atherosclerotic heart disease of native coronary artery without angina pectoris: Secondary | ICD-10-CM | POA: Diagnosis not present

## 2023-01-22 DIAGNOSIS — R413 Other amnesia: Secondary | ICD-10-CM

## 2023-01-22 DIAGNOSIS — Z7189 Other specified counseling: Secondary | ICD-10-CM

## 2023-01-22 LAB — CBC WITH DIFFERENTIAL/PLATELET
Basophils Absolute: 0.1 10*3/uL (ref 0.0–0.1)
Basophils Relative: 0.9 % (ref 0.0–3.0)
Eosinophils Absolute: 0.2 10*3/uL (ref 0.0–0.7)
Eosinophils Relative: 2 % (ref 0.0–5.0)
HCT: 44.1 % (ref 36.0–46.0)
Hemoglobin: 15 g/dL (ref 12.0–15.0)
Lymphocytes Relative: 22.6 % (ref 12.0–46.0)
Lymphs Abs: 1.7 10*3/uL (ref 0.7–4.0)
MCHC: 34 g/dL (ref 30.0–36.0)
MCV: 91.4 fl (ref 78.0–100.0)
Monocytes Absolute: 0.5 10*3/uL (ref 0.1–1.0)
Monocytes Relative: 6.1 % (ref 3.0–12.0)
Neutro Abs: 5.2 10*3/uL (ref 1.4–7.7)
Neutrophils Relative %: 68.4 % (ref 43.0–77.0)
Platelets: 272 10*3/uL (ref 150.0–400.0)
RBC: 4.82 Mil/uL (ref 3.87–5.11)
RDW: 13.7 % (ref 11.5–15.5)
WBC: 7.5 10*3/uL (ref 4.0–10.5)

## 2023-01-22 LAB — COMPREHENSIVE METABOLIC PANEL
ALT: 17 U/L (ref 0–35)
AST: 19 U/L (ref 0–37)
Albumin: 4.3 g/dL (ref 3.5–5.2)
Alkaline Phosphatase: 77 U/L (ref 39–117)
BUN: 14 mg/dL (ref 6–23)
CO2: 24 mEq/L (ref 19–32)
Calcium: 9.8 mg/dL (ref 8.4–10.5)
Chloride: 105 mEq/L (ref 96–112)
Creatinine, Ser: 0.85 mg/dL (ref 0.40–1.20)
GFR: 68.89 mL/min (ref >60.00)
Glucose, Bld: 98 mg/dL (ref 70–99)
Potassium: 4.4 mEq/L (ref 3.5–5.1)
Sodium: 141 mEq/L (ref 135–145)
Total Bilirubin: 0.4 mg/dL (ref 0.2–1.2)
Total Protein: 6.9 g/dL (ref 6.0–8.3)

## 2023-01-22 LAB — TSH: TSH: 0.02 u[IU]/mL — ABNORMAL LOW (ref 0.35–5.50)

## 2023-01-22 LAB — LIPID PANEL
Cholesterol: 240 mg/dL — ABNORMAL HIGH (ref 0–200)
HDL: 44.4 mg/dL (ref >39.00)
LDL Cholesterol: 157 mg/dL — ABNORMAL HIGH (ref 0–99)
NonHDL: 195.41
Total CHOL/HDL Ratio: 5
Triglycerides: 194 mg/dL — ABNORMAL HIGH (ref 0.0–149.0)
VLDL: 38.8 mg/dL (ref 0.0–40.0)

## 2023-01-22 LAB — VITAMIN B12: Vitamin B-12: 1016 pg/mL — ABNORMAL HIGH (ref 211–911)

## 2023-01-22 MED ORDER — THYROID 60 MG PO TABS: ORAL_TABLET | ORAL | Status: DC

## 2023-01-22 NOTE — Progress Notes (Unsigned)
Memory loss.  Short term changes.  No red flag events.  Still driving, can shop w/o troubles.  She didn't get contacted about neurocog testing.  Recheck MMSE 24 out of 30.  -6 for orientation.  Normal testing otherwise.  CAD.    Using medication without problems or lightheadedness: yes Chest pain with exertion:no Edema:no Short of breath:no Off crestor in the meantime- reported "brain fog and fatigue" on med.  She felt better off med.    Has a living will.  Husband is designated if patient were incapacitated. Then daughter [place] designated if husband is incapacitated.    Meds, vitals, and allergies reviewed.   PMH and SH reviewed  ROS: Per HPI unless specifically indicated in ROS section   GEN: nad, alert and oriented HEENT: mucous membranes moist NECK: supple w/o LA CV: rrr. PULM: ctab, no inc wob ABD: soft, +bs EXT: no edema SKIN: no acute rash Recheck MMSE 24 out of 30.  -6 for orientation.  Normal testing otherwise.  30 minutes were devoted to patient care in this encounter (this includes time spent reviewing the patient's file/history, interviewing and examining the patient, counseling/reviewing plan with patient).

## 2023-01-22 NOTE — Patient Instructions (Addendum)
Let me know if you don't get a call about neurocognitive testing by next week.   Take care.  Glad to see you. Go to the lab on the way out.   If you have mychart we'll likely use that to update you.

## 2023-01-24 ENCOUNTER — Other Ambulatory Visit: Payer: Self-pay | Admitting: Family Medicine

## 2023-01-24 DIAGNOSIS — E039 Hypothyroidism, unspecified: Secondary | ICD-10-CM

## 2023-01-24 MED ORDER — THYROID 60 MG PO TABS: ORAL_TABLET | ORAL | Status: AC

## 2023-01-24 NOTE — Assessment & Plan Note (Signed)
She reports not tolerating Crestor.  See notes on labs.

## 2023-01-24 NOTE — Assessment & Plan Note (Signed)
Has a living will.  Husband is designated if patient were incapacitated. Then daughter [place] designated if husband is incapacitated.

## 2023-01-24 NOTE — Assessment & Plan Note (Addendum)
Recheck MMSE 24 out of 30.  -6 for orientation.  Normal testing otherwise.  See notes on labs. Refer for neurocognitive testing.

## 2023-01-26 ENCOUNTER — Telehealth: Payer: Self-pay | Admitting: *Deleted

## 2023-01-26 NOTE — Telephone Encounter (Signed)
Left message for patient to call office.  

## 2023-01-26 NOTE — Telephone Encounter (Signed)
-----   Message from Jettie Booze, MD sent at 01/25/2023  2:09 PM EST ----- Will do, Audelia Acton. Fraser Din, please have her see the PharmD lipid clinic for other options.

## 2023-02-01 NOTE — Telephone Encounter (Signed)
Patient's husband notified.  He does not want referral to lipid clinic at this time but will call office back if patient would like to proceed with referral.

## 2023-02-07 ENCOUNTER — Encounter: Payer: Self-pay | Admitting: Psychology

## 2023-02-27 ENCOUNTER — Ambulatory Visit: Payer: PPO | Admitting: Interventional Cardiology

## 2023-04-26 NOTE — Progress Notes (Signed)
Office Visit    Patient Name: Kim Brown Date of Encounter: 05/03/2023  PCP:  Joaquim Nam, MD   [facility]  Cardiologist:  Lance Muss, MD  Advanced Practice Provider:  No care team member to display Electrophysiologist:  None   HPI    Kim Brown is a 72 y.o. female patient with history of hyperlipidemia who had an anterior MI 4/22 showed the following:   Mid LAD-1 lesion is 100% stenosed. After thrombectomy, a drug-eluting stent was successfully placed using a SYNERGY XD 3.0X16, postdilated to 3.75 mm. Post intervention, there is a 0% residual stenosis. Mid LAD-2 lesion is 95% stenosed. A drug-eluting stent was successfully placed using a SYNERGY XD 2.25X28, postdilated to 2.5 mm. Post intervention, there is a 0% residual stenosis. Mid Cx lesion is 90% stenosed. A drug-eluting stent was successfully placed using a SYNERGY XD 3.50X16, postdilated to 3.75 mm. Post intervention, there is a 0% residual stenosis. 2nd Diag lesion is 50% stenosed. Dist LAD lesion is 25% stenosed. The mid to distal LAD was a very small vessel in general. There is mild to moderate left ventricular systolic dysfunction. LV end diastolic pressure is moderately elevated. LVEDP 23 mm Hg. The left ventricular ejection fraction is 35-45% by visual estimate. There is no aortic valve stenosis.    the last seen 09/07/22 presents today for follow-up.  Plan was to continue dual antiplatelet therapy for 12 months.  Is she would need aggressive secondary prevention.  She had myalgias with lovastatin.  Plan to start rosuvastatin 20 mg daily.  Would need metoprolol and an ACE inhibitor depending on EF.  Admitted with dizziness a week after cath.  Given fluid.  Had urinary retention of 1600 cc.  She has not noticed with ischemic cardiomyopathy and her ejection fraction was 35 to 45%, chronic systolic heart failure.  She continues on dual antiplatelet therapy with aspirin and try  To lower.  Continue beta-blocker with metoprolol and plan to avoid ACE/ARB due to hypotension. Post MI she had fatigue.  Normal BNP.  Plan to repeat echocardiogram that showed improved LVEF.  She reported daily anxiety and walking daily no exertional chest discomfort.  She was still having palpitations and the monitor was ordered which showed normal sinus rhythm with rare PACs, PVCs.  Rare brief runs of PACs and PVCs which occasionally correlated with symptoms.  No atrial fibrillation.  No sustained arrhythmias.  Also complaining of some back pain.  No problems with regular activities.  She hurt her knee during physical therapy.  She was limited and walking somewhat.  Water aerobics were discussed.  Today, she still periodically has issues with palpitations.  Afterwards she typically feels weak and gets nervous.  She also has some fatigue few hours after.  She did have an episode last week.  Per her husband episodes happen about 3-4 times a month.  Reviewed her last monitor which was 2 years ago.  Recent lab work also reviewed and her TSH was low therefore her medication was reduced.  We discussed Leqvio and  Repatha for further cholesterol reduction. LDL above goal at 157.  Discussed changing her metoprolol to the short acting to further reduce fatigue symptoms with the long-acting.  Reports no shortness of breath nor dyspnea on exertion. Reports no chest pain, pressure, or tightness. No edema, orthopnea, PND.   Past Medical History    Past Medical History:  Diagnosis Date   Allergy    Anemia    Arthritis  feet and hands , neck, shoulder   Coronary artery disease    Dysrhythmia 2006   atrial flutter   Exogenous obesity    Heel spur    Hyperlipidemia    Hypertension    Hypothyroid    Irregular heart beats    Ischemic cardiomyopathy    Palpitations    Prolapsed, anus    uses mag oxide powder nightly    RSD (reflex sympathetic dystrophy)    after surgery on R foot   STEMI (ST  elevation myocardial infarction) (HCC)    Stress incontinence, female    Past Surgical History:  Procedure Laterality Date   ABDOMINAL HYSTERECTOMY  2005   BUNIONECTOMY     COLONOSCOPY     CORONARY ANGIOPLASTY     CORONARY STENT INTERVENTION N/A 03/20/2021   Procedure: CORONARY STENT INTERVENTION;  Surgeon: Corky Crafts, MD;  Location: [facility] INVASIVE CV LAB;  Service: Cardiovascular;  Laterality: N/A;   CORONARY/GRAFT ACUTE MI REVASCULARIZATION N/A 03/20/2021   Procedure: Coronary/Graft Acute MI Revascularization;  Surgeon: Corky Crafts, MD;  Location: [facility] INVASIVE CV LAB;  Service: Cardiovascular;  Laterality: N/A;   DILATION AND CURETTAGE OF UTERUS     LEFT HEART CATH AND CORONARY ANGIOGRAPHY N/A 03/20/2021   Procedure: LEFT HEART CATH AND CORONARY ANGIOGRAPHY;  Surgeon: Corky Crafts, MD;  Location: [facility] INVASIVE CV LAB;  Service: Cardiovascular;  Laterality: N/A;   REVERSE SHOULDER ARTHROPLASTY Right 07/15/2020   Procedure: REVERSE SHOULDER ARTHROPLASTY;  Surgeon: Jones Broom, MD;  Location: [facility] ORS;  Service: Orthopedics;  Laterality: Right;    Allergies  Allergies  Allergen Reactions   Albuterol     Jittery sensation   Lovastatin     Myalgias   Pravastatin     Myalgias on 10mg     Rosuvastatin     Lethargy and brain fog, nervousness on 5mg  and 40mg  daily   Vytorin [Ezetimibe-Simvastatin]     Muscle aches    EKGs/Labs/Other Studies Reviewed:   The following studies were reviewed today: Cardiac Studies & Procedures   CARDIAC CATHETERIZATION  CARDIAC CATHETERIZATION 03/20/2021  Narrative  Mid LAD-1 lesion is 100% stenosed. After thrombectomy, a drug-eluting stent was successfully placed using a SYNERGY XD 3.0X16, postdilated to 3.75 mm.  Post intervention, there is a 0% residual stenosis.  Mid LAD-2 lesion is 95% stenosed. A drug-eluting stent was successfully placed using a SYNERGY XD 2.25X28, postdilated to 2.5 mm.  Post intervention, there  is a 0% residual stenosis.  Mid Cx lesion is 90% stenosed. A drug-eluting stent was successfully placed using a SYNERGY XD 3.50X16, postdilated to 3.75 mm.  Post intervention, there is a 0% residual stenosis.  2nd Diag lesion is 50% stenosed.  Dist LAD lesion is 25% stenosed. The mid to distal LAD was a very small vessel in general.  There is mild to moderate left ventricular systolic dysfunction.  LV end diastolic pressure is moderately elevated. LVEDP 23 mm Hg.  The left ventricular ejection fraction is 35-45% by visual estimate.  There is no aortic valve stenosis.  Continue dual antiplatelet therapy for 12 months.  She will need aggressive secondary prevention.  She had myalgias with lovastatin.  Start rosuvastatin 20 mg daily.  She will need metoprolol and likely an ACE inhibitor depending on her ejection fraction.  We will plan for echocardiogram on Monday.  No IV fluids due to elevated LVEDP in the setting of anterior wall MI and decreased LVEF.  I discussed the findings with her husband.  Findings Coronary Findings Diagnostic  Dominance: Left  Left Anterior Descending Mid LAD-1 lesion is 100% stenosed. Vessel is the culprit lesion. Mid LAD-2 lesion is 95% stenosed. Dist LAD lesion is 25% stenosed.  Second Diagonal Branch 2nd Diag lesion is 50% stenosed.  Left Circumflex Mid Cx lesion is 90% stenosed. The lesion is eccentric and irregular.  Intervention  Mid LAD-1 lesion Thrombectomy CATH LAUNCHER 42FR EBU3.5 guide catheter was inserted. WIRE ASAHI PROWATER 180CM guidewire was used to cross lesion. Aspiration thrombectomy performed using a CATH EXTRAC PRONTO LP 42F RND. 1 passes taken. Stent CATH LAUNCHER 42FR EBU3.5 guide catheter was inserted. Lesion crossed with guidewire using a WIRE ASAHI PROWATER 180CM. Pre-stent angioplasty was performed using a BALLOON SAPPHIRE 2.0X15. A drug-eluting stent was successfully placed using a SYNERGY XD 3.0X16. Stent strut is well  apposed. Post-stent angioplasty was performed using a BALLOON [place] EMERGE MR 3.75X8. After seeing flow through the more proximal lesion, we could see the more distal lesion. Post-Intervention Lesion Assessment The intervention was successful. Pre-interventional TIMI flow is 0. Post-intervention TIMI flow is 3. No complications occurred at this lesion. There is a 0% residual stenosis post intervention.  Mid LAD-2 lesion Stent CATH LAUNCHER 42FR EBU3.5 guide catheter was inserted. Lesion crossed with guidewire using a WIRE ASAHI PROWATER 180CM. Pre-stent angioplasty was performed using a BALLOON SAPPHIRE 2.0X15. A drug-eluting stent was successfully placed using a SYNERGY XD 2.25X28. Stent strut is well apposed. Post-stent angioplasty was performed using a BALLOON SAPPHIRE [place] 2.5X12. IC NTG given Post-Intervention Lesion Assessment The intervention was successful. Pre-interventional TIMI flow is 1. Post-intervention TIMI flow is 3. No complications occurred at this lesion. There is a 0% residual stenosis post intervention.  Mid Cx lesion Stent CATH LAUNCHER 42FR EBU3.5 guide catheter was inserted. Lesion crossed with guidewire using a WIRE HI TORQ BMW 190CM. Pre-stent angioplasty was performed using a BALLOON SAPPHIRE 3.0X15. A drug-eluting stent was successfully placed using a SYNERGY XD 3.50X16. Stent strut is well apposed. Post-stent angioplasty was performed using a BALLOON [place] EMERGE MR 3.75X8. Post-Intervention Lesion Assessment The intervention was successful. Pre-interventional TIMI flow is 3. Post-intervention TIMI flow is 3. No complications occurred at this lesion. There is a 0% residual stenosis post intervention.     ECHOCARDIOGRAM  ECHOCARDIOGRAM COMPLETE 07/22/2021  Narrative ECHOCARDIOGRAM REPORT    Patient Name:   Kim Brown Date of Exam: 07/21/2021 Medical Rec #:  [id number]     Height:       64.3 in Accession #:    [id number]    Weight:       157.4 lb Date of Birth:   [birth year]      BSA:          1.772 m Patient Age:    69 years      BP:           119/77 mmHg Patient Gender: F             HR:           73 bpm. Exam Location:  [address]  Procedure: 2D Echo, Cardiac Doppler, Color Doppler, 3D Echo and Strain Analysis  Indications:    I25.10 Coronary artery disease.  History:        Patient has prior history of Echocardiogram examinations, most recent 03/20/2021. CAD, STENTS x 3.; Risk Factors:Hypertension and Dyslipidemia. Myocardial infarction.  Sonographer:    Sedonia Small Rodgers-Jones RDCS Referring Phys: [id number] JAYADEEP S VARANASI  IMPRESSIONS   1. Left ventricular ejection fraction,  by estimation, is 55 to 60%. Left ventricular ejection fraction by 3D volume is 59 %. The left ventricle has normal function. The left ventricle has no regional wall motion abnormalities. Left ventricular diastolic parameters are consistent with Grade I diastolic dysfunction (impaired relaxation). The average left ventricular global longitudinal strain is -23.9 %. The global longitudinal strain is normal. 2. Right ventricular systolic function is normal. The right ventricular size is normal. 3. The mitral valve is normal in structure. No evidence of mitral valve regurgitation. No evidence of mitral stenosis. 4. The aortic valve is normal in structure. Aortic valve regurgitation is not visualized. No aortic stenosis is present. 5. The inferior vena cava is normal in size with greater than 50% respiratory variability, suggesting right atrial pressure of 3 mmHg.  Comparison(s): Prior images reviewed side by side. The left ventricular function has improved.  FINDINGS Left Ventricle: Left ventricular ejection fraction, by estimation, is 55 to 60%. Left ventricular ejection fraction by 3D volume is 59 %. The left ventricle has normal function. The left ventricle has no regional wall motion abnormalities. The average left ventricular global longitudinal strain is -23.9 %. The  global longitudinal strain is normal. The left ventricular internal cavity size was normal in size. There is no left ventricular hypertrophy. Left ventricular diastolic parameters are consistent with Grade I diastolic dysfunction (impaired relaxation). Normal left ventricular filling pressure.  Right Ventricle: The right ventricular size is normal. No increase in right ventricular wall thickness. Right ventricular systolic function is normal.  Left Atrium: Left atrial size was normal in size.  Right Atrium: Right atrial size was normal in size.  Pericardium: There is no evidence of pericardial effusion.  Mitral Valve: The mitral valve is normal in structure. No evidence of mitral valve regurgitation. No evidence of mitral valve stenosis.  Tricuspid Valve: The tricuspid valve is normal in structure. Tricuspid valve regurgitation is not demonstrated. No evidence of tricuspid stenosis.  Aortic Valve: The aortic valve is normal in structure. Aortic valve regurgitation is not visualized. No aortic stenosis is present.  Pulmonic Valve: The pulmonic valve was normal in structure. Pulmonic valve regurgitation is not visualized. No evidence of pulmonic stenosis.  Aorta: The aortic root is normal in size and structure.  Venous: The inferior vena cava is normal in size with greater than 50% respiratory variability, suggesting right atrial pressure of 3 mmHg.  IAS/Shunts: No atrial level shunt detected by color flow Doppler.   LEFT VENTRICLE PLAX 2D LVIDd:         4.50 cm         Diastology LVIDs:         2.60 cm         LV e' medial:    7.18 cm/s LV PW:         0.70 cm         LV E/e' medial:  10.2 LV IVS:        0.70 cm         LV e' lateral:   7.94 cm/s LVOT diam:     1.80 cm         LV E/e' lateral: 9.2 LV SV:         62 LV SV Index:   35              2D LVOT Area:     2.54 cm        Longitudinal Strain 2D Strain GLS  -24.1 % (A2C): 2D Strain GLS  -  24.4 % (A3C): 2D Strain GLS  -23.2  % (A4C): 2D Strain GLS  -23.9 % Avg:  3D Volume EF LV 3D EF:    Left ventricul ar ejection fraction by 3D volume is 59 %.  3D Volume EF: 3D EF:        59 % LV EDV:       91 ml LV ESV:       37 ml LV SV:        54 ml  RIGHT VENTRICLE             IVC RV Basal diam:  2.60 cm     IVC diam: 1.20 cm RV S prime:     16.70 cm/s TAPSE (M-mode): 2.9 cm  LEFT ATRIUM             Index       RIGHT ATRIUM           Index LA diam:        3.30 cm 1.86 cm/m  RA Area:     11.40 cm LA Vol (A2C):   21.3 ml 12.02 ml/m RA Volume:   27.80 ml  15.69 ml/m LA Vol (A4C):   17.3 ml 9.76 ml/m LA Biplane Vol: 19.2 ml 10.84 ml/m AORTIC VALVE LVOT Vmax:   122.33 cm/s LVOT Vmean:  80.000 cm/s LVOT VTI:    0.242 m  AORTA Ao Root diam: 3.20 cm Ao Asc diam:  3.50 cm  MITRAL VALVE                TRICUSPID VALVE MV Area (PHT): 2.56 cm     TR Peak grad:   19.0 mmHg MV Decel Time: 296 msec     TR Vmax:        218.00 cm/s MV E velocity: 73.30 cm/s MV A velocity: 106.00 cm/s  SHUNTS MV E/A ratio:  0.69         Systemic VTI:  0.24 m Systemic Diam: 1.80 cm  Rachelle Hora Croitoru MD Electronically signed by Thurmon Fair MD Signature Date/Time: 07/22/2021/7:10:32 AM    Final    MONITORS  LONG TERM MONITOR (3-14 DAYS) 05/13/2021  Narrative  Normal sinus rhythm with rare PACs, PVCs.  Rare brief runs of PACs and PVCs which occasionally correlate to symptoms.  No atrial fibrillation.  No sustained arrhythmias.   Patch Wear Time:  14 days and 0 hours (2022-05-06T12:31:11-399 to 2022-05-20T12:31:16-0400)  Patient had a min HR of 55 bpm, max HR of 187 bpm, and avg HR of 77 bpm. Predominant underlying rhythm was Sinus Rhythm. 2 Ventricular Tachycardia runs occurred, the run with the fastest interval lasting 5 beats with a max rate of 150 bpm, the longest lasting 11 beats with an avg rate of 111 bpm. 13 Supraventricular Tachycardia runs occurred, the run with the fastest interval lasting 4 beats  with a max rate of 187 bpm, the longest lasting 20 beats with an avg rate of 112 bpm. Supraventricular Tachycardia was detected within +/- 45 seconds of symptomatic patient event(s). Isolated SVEs were rare (<1.0%), SVE Couplets were rare (<1.0%), and SVE Triplets were rare (<1.0%). Isolated VEs were rare (<1.0%, 3633), VE Couplets were rare (<1.0%, 28), and VE Triplets were rare (<1.0%, 6).            EKG:  EKG is  ordered today.  The ekg ordered today demonstrates NSR, rate 74  Recent Labs: 01/22/2023: ALT 17; BUN 14; Creatinine, Ser 0.85; Hemoglobin 15.0; Platelets 272.0; Potassium 4.4;  Sodium 141; TSH 0.02  Recent Lipid Panel    Component Value Date/Time   CHOL 240 (H) 01/22/2023 1208   CHOL 138 03/02/2022 1130   TRIG 194.0 (H) 01/22/2023 1208   HDL 44.40 01/22/2023 1208   HDL 42 03/02/2022 1130   CHOLHDL 5 01/22/2023 1208   VLDL 38.8 01/22/2023 1208   LDLCALC 157 (H) 01/22/2023 1208   LDLCALC 71 03/02/2022 1130   LDLDIRECT 184.0 09/05/2019 1120    Home Medications   Current Meds  Medication Sig   Ascorbic Acid (VITAMIN C) 1000 MG tablet Take 1,000 mg by mouth daily.   aspirin EC 81 MG tablet Take 81 mg by mouth daily. Swallow whole.   b complex vitamins tablet Take 1 tablet by mouth daily.   clopidogrel (PLAVIX) 75 MG tablet Take 1 tablet (75 mg total) by mouth daily.   Cod Liver Oil CAPS Take 1 capsule by mouth daily at 6 (six) AM.   estradiol (CLIMARA - DOSED IN MG/24 HR) 0.05 mg/24hr patch 0.05 mg once a week.   ezetimibe (ZETIA) 10 MG tablet Take 5 mg by mouth daily.   Magnesium 250 MG TABS Take 250 mg by mouth daily.   metoprolol tartrate (LOPRESSOR) 25 MG tablet Take 0.5 tablets (12.5 mg total) by mouth 2 (two) times daily.   OVER THE COUNTER MEDICATION Take 600 mg by mouth daily at 6 (six) AM. RED YEAST RICE W/ CO Q 10.   OVER THE COUNTER MEDICATION Take 2,000 Units by mouth daily at 6 (six) AM. NATTOKINASE bloodthinner supplement.   Polyethyl Glycol-Propyl Glycol  (SYSTANE) 0.4-0.3 % SOLN Place 1 drop into both eyes 2 (two) times daily.   progesterone (PROMETRIUM) 100 MG capsule Take 300 mg by mouth at bedtime.    thyroid (ARMOUR) 60 MG tablet 60mg  in the AM and 60mg  in the PM.   zinc gluconate 50 MG tablet Take 50 mg by mouth daily.   [DISCONTINUED] metoprolol succinate (TOPROL-XL) 25 MG 24 hr tablet TAKE 1 TABLET BY MOUTH DAILY   [DISCONTINUED] nitroGLYCERIN (NITROSTAT) 0.4 MG SL tablet Place 1 tablet (0.4 mg total) under the tongue every 5 (five) minutes as needed for chest pain.     Review of Systems      All other systems reviewed and are otherwise negative except as noted above.  Physical Exam    VS:  BP 112/68   Pulse 74   Ht 5\' 4"  (1.626 m)   Wt 156 lb 3.2 oz (70.9 kg)   SpO2 99%   BMI 26.81 kg/m  , BMI Body mass index is 26.81 kg/m.  Wt Readings from Last 3 Encounters:  05/03/23 156 lb 3.2 oz (70.9 kg)  01/22/23 154 lb (69.9 kg)  12/25/22 152 lb (68.9 kg)     GEN: Well nourished, well developed, in no acute distress. HEENT: normal. Neck: Supple, no JVD, carotid bruits, or masses. Cardiac: RRR, no murmurs, rubs, or gallops. No clubbing, cyanosis, edema.  Radials/PT 2+ and equal bilaterally.  Respiratory:  Respirations regular and unlabored, clear to auscultation bilaterally. GI: Soft, nontender, nondistended. MS: No deformity or atrophy. Skin: Warm and dry, no rash. Neuro:  Strength and sensation are intact. Psych: Normal affect.  Assessment & Plan    CAD/old MI -no chest pains -update nitro, continue plavix 75 mg daily, zetia 5 mg daily Metoprolol (changed to tartrate 12.5 BID)  -LDL 157 when checked in Feb, goal should be less than 70 (really less than 55) -discussed repatha/praulent and Leqvio  -  set up appointment with PharmD to discuss  Hypertension -BP is well controlled today -continue current medications  Hyperlipidemia -her LDL is not at goal -discussed options with her and set her up with PharmD -statin  intolerance well documented  Dizziness -pretty much resolved  Memory disturbance -husband is with her today        Disposition: Follow up 1 year with Lance Muss, MD or APP.  Signed, Sharlene Dory, PA-C 05/03/2023, 12:27 PM [facility]

## 2023-05-03 ENCOUNTER — Ambulatory Visit: Payer: PPO | Attending: Interventional Cardiology | Admitting: Physician Assistant

## 2023-05-03 ENCOUNTER — Encounter: Payer: Self-pay | Admitting: Physician Assistant

## 2023-05-03 VITALS — BP 112/68 | HR 74 | Ht 64.0 in | Wt 156.2 lb

## 2023-05-03 DIAGNOSIS — R413 Other amnesia: Secondary | ICD-10-CM

## 2023-05-03 DIAGNOSIS — I2510 Atherosclerotic heart disease of native coronary artery without angina pectoris: Secondary | ICD-10-CM | POA: Diagnosis not present

## 2023-05-03 DIAGNOSIS — R42 Dizziness and giddiness: Secondary | ICD-10-CM | POA: Diagnosis not present

## 2023-05-03 DIAGNOSIS — T466X5A Adverse effect of antihyperlipidemic and antiarteriosclerotic drugs, initial encounter: Secondary | ICD-10-CM

## 2023-05-03 DIAGNOSIS — G720 Drug-induced myopathy: Secondary | ICD-10-CM

## 2023-05-03 DIAGNOSIS — I10 Essential (primary) hypertension: Secondary | ICD-10-CM

## 2023-05-03 DIAGNOSIS — E785 Hyperlipidemia, unspecified: Secondary | ICD-10-CM

## 2023-05-03 DIAGNOSIS — T466X5D Adverse effect of antihyperlipidemic and antiarteriosclerotic drugs, subsequent encounter: Secondary | ICD-10-CM

## 2023-05-03 MED ORDER — METOPROLOL TARTRATE 25 MG PO TABS: 12.5000 mg | ORAL_TABLET | Freq: Two times a day (BID) | ORAL | 2 refills | Status: DC

## 2023-05-03 MED ORDER — NITROGLYCERIN 0.4 MG SL SUBL: 0.4000 mg | SUBLINGUAL_TABLET | SUBLINGUAL | 2 refills | Status: DC | PRN

## 2023-05-03 NOTE — Patient Instructions (Signed)
Medication Instructions:    START TAKING:  METOPROLOL 12.5 MG TWICE A DAY    *If you need a refill on your cardiac medications before your next appointment, please call your pharmacy*   Lab Work:  NONE ORDERED  TODAY    If you have labs (blood work) drawn today and your tests are completely normal, you will receive your results only by: MyChart Message (if you have MyChart) OR A paper copy in the mail If you have any lab test that is abnormal or we need to change your treatment, we will call you to review the results.   Testing/Procedures: NONE ORDERED  TODAY    Follow-Up: At [facility], you and your health needs are our priority.  As part of our continuing mission to provide you with exceptional heart care, we have created designated Provider Care Teams.  These Care Teams include your primary Cardiologist (physician) and Advanced Practice Providers (APPs -  Physician Assistants and Nurse Practitioners) who all work together to provide you with the care you need, when you need it.  We recommend signing up for the patient portal called "MyChart".  Sign up information is provided on this After Visit Summary.  MyChart is used to connect with patients for Virtual Visits (Telemedicine).  Patients are able to view lab/test results, encounter notes, upcoming appointments, etc.  Non-urgent messages can be sent to your provider as well.   To learn more about what you can do with MyChart, go to [email or web address].    Your next appointment:    2-4 week(s) PHARMD  TO DISCUSS MEDICATION    Provider:  1 YEAR WITH   Lance Muss, MD     Other Instructions

## 2023-05-31 ENCOUNTER — Ambulatory Visit: Payer: PPO

## 2023-07-19 ENCOUNTER — Ambulatory Visit: Payer: PPO

## 2023-08-28 ENCOUNTER — Ambulatory Visit: Payer: PPO | Attending: Physician Assistant

## 2023-08-28 NOTE — Progress Notes (Deleted)
Patient ID: Kim Brown                 DOB: [birth year]                    MRN: [id number]      HPI: Kim Brown is a 72 y.o. female patient referred to lipid clinic by Jari Favre, PA. PMH is significant for HTN, CAD s/p anterior MI 03/2021 with multiple 90-100% occlusions, tx with 3 DES placed to mid LAD-1, mid LAD-2, and mid Cx. She was started on rosuvastatin 40mg  daily as she previously experienced myalgias on lovastatin. Also with hx of ischemic cardiomyopathy with LVEF 35-45% and chronic systolic HF, follow up echo with improvement in EF to 55-60% on 07/2021 echo.   She was seen by lipid clinic in Nov 2022. She opted to try low dose rosuvastatin vs PSCK9i. She reported fatigue and nervousness with rosuvastatin 5mg  but continued it. Ezetimibe 5mg  daily was started. She eventually stopped the rosuvastatin on her own due to brain fog. She was offered a referral back to lipid clinic, but husband declined at that time (Feb 2024).    Reviewed options for lowering LDL cholesterol, including ezetimibe, PCSK-9 inhibitors, bempedoic acid and inclisiran.  Discussed mechanisms of action, dosing, side effects and potential decreases in LDL cholesterol.  Also reviewed cost information and potential options for patient assistance.   Current Medications: ezetimibe 5mg  daily Intolerances: rosuvastatin 5mg  daily, rosuvastatin 40mg  daily, pravastatin 10mg  daily, lovastatin 10mg  daily  Risk Factors:  LDL-C goal:  ApoB goal:   Diet:   Exercise:   Family History: Arthritis in her father and mother; Colon polyps in her mother; Diabetes in her brother; Heart disease in her father; Heart failure in her brother; Hypertension in her father and mother; Pancreatic cancer in her brother; Stroke in her father.    Social History:   Labs: Lipid Panel     Component Value Date/Time   CHOL 240 (H) 01/22/2023 1208   CHOL 138 03/02/2022 1130   TRIG 194.0 (H) 01/22/2023 1208   HDL 44.40 01/22/2023 1208   HDL 42  03/02/2022 1130   CHOLHDL 5 01/22/2023 1208   VLDL 38.8 01/22/2023 1208   LDLCALC 157 (H) 01/22/2023 1208   LDLCALC 71 03/02/2022 1130   LDLDIRECT 184.0 09/05/2019 1120   LABVLDL 25 03/02/2022 1130    Past Medical History:  Diagnosis Date   Allergy    Anemia    Arthritis    feet and hands , neck, shoulder   Coronary artery disease    Dysrhythmia 2006   atrial flutter   Exogenous obesity    Heel spur    Hyperlipidemia    Hypertension    Hypothyroid    Irregular heart beats    Ischemic cardiomyopathy    Palpitations    Prolapsed, anus    uses mag oxide powder nightly    RSD (reflex sympathetic dystrophy)    after surgery on R foot   STEMI (ST elevation myocardial infarction) (HCC)    Stress incontinence, female     Current Outpatient Medications on File Prior to Visit  Medication Sig Dispense Refill   Ascorbic Acid (VITAMIN C) 1000 MG tablet Take 1,000 mg by mouth daily.     aspirin EC 81 MG tablet Take 81 mg by mouth daily. Swallow whole.     b complex vitamins tablet Take 1 tablet by mouth daily.     clopidogrel (PLAVIX) 75 MG tablet Take 1 tablet (  75 mg total) by mouth daily.     Cod Liver Oil CAPS Take 1 capsule by mouth daily at 6 (six) AM.     estradiol (CLIMARA - DOSED IN MG/24 HR) 0.05 mg/24hr patch 0.05 mg once a week.     ezetimibe (ZETIA) 10 MG tablet Take 5 mg by mouth daily.     Magnesium 250 MG TABS Take 250 mg by mouth daily.     metoprolol tartrate (LOPRESSOR) 25 MG tablet Take 0.5 tablets (12.5 mg total) by mouth 2 (two) times daily. 90 tablet 2   nitroGLYCERIN (NITROSTAT) 0.4 MG SL tablet Place 1 tablet (0.4 mg total) under the tongue every 5 (five) minutes as needed for chest pain. 25 tablet 2   OVER THE COUNTER MEDICATION Take 600 mg by mouth daily at 6 (six) AM. RED YEAST RICE W/ CO Q 10.     OVER THE COUNTER MEDICATION Take 2,000 Units by mouth daily at 6 (six) AM. NATTOKINASE bloodthinner supplement.     Polyethyl Glycol-Propyl Glycol (SYSTANE)  0.4-0.3 % SOLN Place 1 drop into both eyes 2 (two) times daily.     progesterone (PROMETRIUM) 100 MG capsule Take 300 mg by mouth at bedtime.      thyroid (ARMOUR) 60 MG tablet 60mg  in the AM and 60mg  in the PM.     zinc gluconate 50 MG tablet Take 50 mg by mouth daily.     No current facility-administered medications on file prior to visit.    Allergies  Allergen Reactions   Albuterol     Jittery sensation   Lovastatin     Myalgias   Pravastatin     Myalgias on 10mg     Rosuvastatin     Lethargy and brain fog, nervousness on 5mg  and 40mg  daily   Vytorin [Ezetimibe-Simvastatin]     Muscle aches    Assessment/Plan:  1. Hyperlipidemia -  No problem-specific Assessment & Plan notes found for this encounter.    Thank you,  Olene Floss, Pharm.D, BCPS, CPP [facility] A Division of [facility] [address], [place], [place] [address]  Phone: [phone]; Fax: [phone]

## 2023-11-08 ENCOUNTER — Encounter: Payer: PPO | Attending: Psychology | Admitting: Psychology

## 2023-11-08 DIAGNOSIS — G3184 Mild cognitive impairment, so stated: Secondary | ICD-10-CM | POA: Diagnosis not present

## 2023-11-08 NOTE — Progress Notes (Signed)
Neuropsychological Consultation   Patient:   Kim Brown   DOB:   26-May-1951  MR Number:  [id number]  Location:  [facility] - A DEPT OF [facility] [address] [place], [place] [address] [place] [address] Dept: [phone]           Date of Service:   11/08/2023  Location of Service and Individuals present: Today's visit was conducted in my outpatient clinic office with the patient and her husband present for this visit.  Start Time:   1 PM End Time:   3 PM  Patient Consent and Confidentiality: Limits of confidentiality were discussed including the fact that she was referred for neuropsychological evaluation and what that entailed.  Patient and her husband were informed that the expectation was a neuropsychological evaluation with report being provided to her primary care physician Crawford Givens, MD and also to be made available in the patient's electronic medical records for other appropriate medical professionals to review if appropriate.  Patient has been consent to complete the evaluation.  Consent for Evaluation and Treatment:  Signed:  Yes Explanation of Privacy Policies:  Signed:  Yes Discussion of Confidentiality Limits:  Yes  Provider/Observer:  Arley Phenix, Psy.D.       Clinical Neuropsychologist       Billing Code/Service: 96116/96121  Chief Complaint:     Chief Complaint  Patient presents with   Memory Loss    Reason for Service:    Kim Brown is a 72 year old female with a past medical history including coronary artery disease and STEMI in 2022.  Patient has reported ongoing issues with fatigue and lethargy and reporting changes in memory and cognition shortly after her heart attack.  The patient and husband report that after Cath Lab/multiple stent placement the patient felt very good and much better than she had been for 3 days.  When she was at the cardiac rehab  program 3 days later she suddenly felt sudden fatigue and other difficulties in his had difficulty ever since.  Full workup has not identified any particular cause for this and the patient did not have another heart attack.  The patient's wife minimizes her memory changes reports that she may have had a little bit of memory issues before her heart attack but feels like most of it developed after her heart attack.  The patient husband reports that there was a rather sudden change 3 days after her heart attack and these cognitive changes have been rather steady over the past 2 years.  Patient's husband reports that she has much greater difficulty with semantic memory issues than episodic memory issues.  The patient has difficulty learning new information such as how to navigate TV channels now that their TV is changed from cable to streaming services.  The patient had difficulty keeping up with recent presidential election information etc.  They both deny any changes in geographic orientation and the patient has continued to drive without issue.  Other than short-term memory issues and fatigue no other issues are noted.  Patient and husband deny any tremors, auditory or visual hallucinations etc.  They also deny any family history of progressive degenerative conditions.  The patient's husband gives more detail about her fatigue.  The patient is described as rarely feeling good but she herself cannot pinpoint any particular issue that she feels bad about other than being tired.  She is constantly telling her husband  that she feels tired and fatigued.  There have been attempts at changing medications and stopping all medications that were not essential to see if some of her changes were medication induced.  The patient also has a history of anxiety and describes feeling jittery but this is more episodic along with her weakness.  This anxiety is described to last for 30 minutes and then returns back to baseline.  The  symptoms have been present since her cardiac event.  And descriptions previously with her PCP her memory issues were described as episodic lapses and word searching and conversation and getting details confused.  She is not described as ever having issues with orientation.  The patient does have some abnormalities in sleep pattern.  The patient is described as going to sleep around 1:30 AM and describes feeling her best during the day between 11 PM and 1 AM.  She then goes to bed around 1:30 PM and typically wakes up between 9 or 10 AM.  She is described as sleeping a full 8 hours but does not feel rested in the morning.  Medical History:   Past Medical History:  Diagnosis Date   Allergy    Anemia    Arthritis    feet and hands , neck, shoulder   Coronary artery disease    Dysrhythmia 2006   atrial flutter   Exogenous obesity    Heel spur    Hyperlipidemia    Hypertension    Hypothyroid    Irregular heart beats    Ischemic cardiomyopathy    Palpitations    Prolapsed, anus    uses mag oxide powder nightly    RSD (reflex sympathetic dystrophy)    after surgery on R foot   STEMI (ST elevation myocardial infarction) (HCC)    Stress incontinence, female          Patient Active Problem List   Diagnosis Date Noted   Neck pain 06/28/2022   Ear pain 02/20/2022   Memory change 02/20/2022   Statin myopathy 11/02/2021   CAD (coronary artery disease) 03/28/2021   Near syncope 03/27/2021   Acute systolic heart failure (HCC) 03/22/2021   Acute anterior wall MI (HCC) 03/20/2021   Lower urinary tract symptoms (LUTS) 12/01/2020   Hormone replacement therapy (HRT) 12/01/2020   Hypothyroidism 12/01/2020   Presence of orthotic device 12/01/2020   Dysuria 09/18/2019   Shoulder pain 09/18/2019   Healthcare maintenance 05/01/2018   Allergic urticaria 01/29/2018   Perennial allergic rhinitis 01/29/2018   Allergic reaction 01/29/2018   Advance care planning 03/21/2017   Constipation  06/22/2013   Dyspnea 06/22/2013   Hyperlipidemia 09/07/2011    Additional Tests and Measures from other records:  Neuroimaging Results: Patient had a CT head without contrast performed on 03/10/2022 due to memory loss and altered mental status.  No acute abnormalities were noted and no descriptions of unusual or causative factors noted such as mass, lesion or acute infarction or significant intracranial injury.  Laboratory Tests: Patient has regularly had low TSH noted in workups.  Other laboratory workups have been generally within normal limits including B12.  Behavioral Observation/Mental Status:   Kim Brown  presents as a 72 y.o.-year-old Right handed Caucasian Female who appeared her stated age. her dress was Appropriate and she was Well Groomed and her manners were Appropriate to the situation.  her participation was indicative of Appropriate and Redirectable behaviors.  There were not physical disabilities noted.  she displayed an appropriate level of cooperation and motivation.  Interactions:    Active Redirectable  Attention:   abnormal and attention span appeared shorter than expected for age  Memory:   abnormal; remote memory intact, recent memory impaired  Visuo-spatial:   not examined  Speech (Volume):  low  Speech:   normal; normal  Thought Process:  Coherent  Directed and Oriented  Though Content:  WNL; not suicidal and not homicidal  Orientation:   person, place, time/date, and situation  Judgment:   Fair  Planning:   Fair  Affect:    Blunted, Flat, and Lethargic  Mood:    Dysphoric  Insight:   Fair  Intelligence:   normal  Marital Status/Living:  The patient was born and raised in [facility] [place] along with 3 siblings.  Pregnancy, birth and early developmental issues were all within normal limits.  The patient did have meningitis in the third grade roughly and was hospitalized.  Educational and Occupational History:     Highest Level  of Education:   Patient graduated from college and completed eight 2-year associates degree at [employer] and maintained a 4.0 GPA.  She always did well in business classes and had no areas of academic development that were problematic or delayed.  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  Her longest duration of employment was 8 years.  Hobbies and Interests: Family   Psychiatric History:  No prior psychiatric history noted the patient has had episodes of anxiety and potentially hypervigilance of medical status since heart attack.  History of Substance Use or Abuse:  No concerns of substance abuse are reported.  Family Med/Psych History:  Family History  Problem Relation Age of Onset   Arthritis Mother    Hypertension Mother    Colon polyps Mother    Heart disease Father    Hypertension Father    Arthritis Father    Stroke Father    Heart failure Brother    Diabetes Brother    Pancreatic cancer Brother    Colon cancer Neg Hx    Breast cancer Neg Hx    Esophageal cancer Neg Hx    Rectal cancer Neg Hx    Stomach cancer Neg Hx      Impression/DX:   Kim Brown is a 72 year old female with a past medical history including coronary artery disease and STEMI in 2022.  Patient has reported ongoing issues with fatigue and lethargy and reporting changes in memory and cognition shortly after her heart attack.  The patient and husband report that after Cath Lab/multiple stent placement the patient felt very good and much better than she had been for 3 days.  When she was at the cardiac rehab program 3 days later she suddenly felt sudden fatigue and other difficulties in his had difficulty ever since.  Full workup has not identified any particular cause for this and the patient  did not have another heart attack.  The patient's wife minimizes her memory changes reports that she may have had a little bit of memory issues before her heart attack but feels like most of it developed after her heart attack.  The patient husband reports that there was a rather sudden change 3 days after her heart attack and these cognitive changes have been rather steady over the past 2 years.  Patient's husband reports that she has much greater difficulty with semantic memory issues than episodic memory issues.  The patient has difficulty learning new information such as how to navigate TV channels now that their TV is changed from cable to streaming services.  The patient had difficulty keeping up with recent presidential election information etc.  They both deny any changes in geographic orientation and the patient has continued to drive without issue.  Other than short-term memory issues and fatigue no other issues are noted.  Patient and husband deny any tremors, auditory or visual hallucinations etc.  They also deny any family history of progressive degenerative conditions.  Disposition/Plan:  We have set the patient for formal neuropsychological evaluation and she will complete a foundational battery of the Wechsler Adult Intelligence Scale's and Wechsler Memory Scale's as well as the controlled oral Word Association testing grooved pegboard test.  Once these are completed a formal report will be made and a determination be made whether there was any need for further neuropsychological test procedures.  I will forward a copy of the evaluation to her treating PCP and make it available in her EMR for appropriate medical professionals to have access and for preservation of information.  The patient and husband will also be given the opportunity to return for formal feedbacks session with specific recommendations were warranted for them and an explanation of the evaluative report.  Diagnosis:    Mild  cognitive impairment with memory loss        Note: This document was prepared using Dragon voice recognition software and may include unintentional dictation errors.   Electronically Signed   _______________________ Arley Phenix, Psy.D. Clinical Neuropsychologist

## 2023-11-13 DIAGNOSIS — G3184 Mild cognitive impairment, so stated: Secondary | ICD-10-CM | POA: Diagnosis not present

## 2023-11-13 NOTE — Progress Notes (Signed)
Behavioral Observations:  The patient appeared well-groomed and appropriately dressed. Her manners were polite and appropriate to the situation. The patient's attitude towards testing was positive and her effort was decent. The patient appeared quite fatigued and at times anxious during testing and took frequent breaks. The patient also became frustrated during the Verbal Paired Associates portion of the [facility] and therefore did not complete that part of the test.  Neuropsychology Note  Kim Brown completed 110 minutes of neuropsychological testing with technician, Staci Acosta, BA, under the supervision of Arley Phenix, PsyD., Clinical Neuropsychologist. The patient did not appear overtly distressed by the testing session, per behavioral observation or via self-report to the technician. Rest breaks were offered.   Clinical Decision Making: In considering the patient's current level of functioning, level of presumed impairment, nature of symptoms, emotional and behavioral responses during clinical interview, level of literacy, and observed level of motivation/effort, a battery of tests was selected by Dr. Kieth Brightly during initial consultation on 11/08/2023. This was communicated to the technician. Communication between the neuropsychologist and technician was ongoing throughout the testing session and changes were made as deemed necessary based on patient performance on testing, technician observations and additional pertinent factors such as those listed above.  Tests Administered: Controlled Oral Word Association Test (COWAT; FAS & Animals)  Grooved Pegboard Wechsler Adult Intelligence Scale, 4th Edition (WAIS-IV) Wechsler Memory Scale, 4th Edition (WMS-IV); Older Adult Battery   Results:  COWAT:  FAS total= 21 Z= -1.08 Animals total= 10 Z= 1.53  Grooved Pegboard:  R (DH) time= 88s Percentile Rank= 41st L (NDH) time= 157s Percentile Rank= 32nd  WAIS-IV:   Composite Score  Summary  Scale Sum of Scaled Scores Composite Score Percentile Rank 95% Conf. Interval Qualitative Description  Verbal Comprehension 23 VCI 87 19 82-93 Low Average  Perceptual Reasoning 24 PRI 88 21 82-95 Low Average  Working Memory 16 WMI 89 23 83-96 Low Average  Processing Speed 11 PSI 76 5 70-87 Borderline  Full Scale 74 FSIQ 82 12 78-86 Low Average  General Ability 47 GAI 86 18 81-91 Low Average   Verbal Comprehension Subtests Summary  Subtest Raw Score Scaled Score Percentile Rank Reference Group Scaled Score SEM  Similarities 20 8 25 7  0.95  Vocabulary 32 9 37 9 0.67  Information 7 6 9 6  0.73  The scaled scores in the Reference Group Scaled Score column are based on the performance of examinees aged 20:0-34:11 (i.e., the reference group). See Chapter 6 of the WAIS-IV Technical and Interpretive Manual for more information.  Perceptual Reasoning Subtests Summary  Subtest Raw Score Scaled Score Percentile Rank Reference Group Scaled Score SEM  Block Design 28 9 37 6 0.99  Matrix Reasoning 8 8 25 4  0.90  Visual Puzzles 7 7 16 5  0.99   Working Librarian, academic Raw Score Scaled Score Percentile Rank Reference Group Scaled Score SEM  Digit Span 23 9 37 7 0.73  Arithmetic 10 7 16 7  1.20   Processing Speed Subtests Summary  Subtest Raw Score Scaled Score Percentile Rank Reference Group Scaled Score SEM  Symbol Search 12 5 5 3  1.12  Coding 31 6 9 3  1.12    WMS-IV:   Index Score Summary  Index Sum of Scaled Scores Index Score Percentile Rank 95% Confidence Interval Qualitative Descriptor  Auditory Memory (AMI) 11 55 0.1 51-63 Extremely Low  Visual Memory (VMI) 6 58 0.3 54-64 Extremely Low  Immediate Memory (IMI) 13 65 1 61-73 Extremely Low  Delayed Memory (DMI) 4 43 <0.1 40-55 Extremely Low     Primary Subtest Scaled Score Summary  Subtest Domain Raw Score Scaled Score Percentile Rank  Logical Memory I AM 15 4 2   Logical Memory II AM 1 2 0.4  Verbal  Paired Associates I AM 5 4 2   Verbal Paired Associates II AM 0 1 0.1  Visual Reproduction I VM 19 5 5   Visual Reproduction II VM 0 1 0.1  Symbol Span VWM 13 8 25    Auditory Memory Process Score Summary  Process Score Raw Score Scaled Score Percentile Rank Cumulative Percentage (Base Rate)  LM II Recognition 17 - - 26-50%  VPA II Recognition 0 - - <=2%   Visual Memory Process Score Summary  Process Score Raw Score Scaled Score Percentile Rank Cumulative Percentage (Base Rate)  VR II Recognition 3 - - 17-25%    ABILITY-MEMORY ANALYSIS  Ability Score:  VCI: 87 Date of Testing:  WAIS-IV; WMS-IV 2023/11/13  Predicted Difference Method   Index Predicted WMS-IV Index Score Actual WMS-IV Index Score Difference Critical Value  Significant Difference Y/N Base Rate  Auditory Memory 93 55 38 10.41 Y <1%  Visual Memory 94 58 36 7.35 Y <1%  Immediate Memory 92 65 27 9.69 Y 1-2%  Delayed Memory 93 43 50 12.22 Y <1%  Statistical significance (critical value) at the .01 level.    Feedback to Patient: Kim Brown will return on 08/26/2024 for an interactive feedback session with Dr. Kieth Brightly at which time her test performances, clinical impressions and treatment recommendations will be reviewed in detail. The patient understands she can contact our office should she require our assistance before this time.  110 minutes spent face-to-face with patient administering standardized tests, 30 minutes spent scoring Radiographer, therapeutic). [CPT [id number], [id number]]  Full report to follow.

## 2023-12-27 ENCOUNTER — Ambulatory Visit (INDEPENDENT_AMBULATORY_CARE_PROVIDER_SITE_OTHER): Payer: PPO

## 2023-12-27 VITALS — Ht 64.0 in | Wt 156.0 lb

## 2023-12-27 DIAGNOSIS — Z0000 Encounter for general adult medical examination without abnormal findings: Secondary | ICD-10-CM | POA: Diagnosis not present

## 2023-12-27 NOTE — Progress Notes (Signed)
 Subjective:   Kim Brown is a 73 y.o. female who presents for Medicare Annual (Subsequent) preventive examination.  Visit Complete: Virtual I connected with  [name] on 12/27/23 by a audio enabled telemedicine application and verified that I am speaking with the correct person using two identifiers.  Patient Location: Home  Provider Location: Office/Clinic  I discussed the limitations of evaluation and management by telemedicine. The patient expressed understanding and agreed to proceed.  Vital Signs: Because this visit was a virtual/telehealth visit, some criteria may be missing or patient reported. Any vitals not documented were not able to be obtained and vitals that have been documented are patient reported.  Patient Medicare AWV questionnaire was completed by the patient on (not done); I have confirmed that all information answered by patient is correct and no changes since this date.  Cardiac Risk Factors include: advanced age (>21men, >34 women);dyslipidemia    Objective:    Today's Vitals   12/27/23 0914  Weight: 156 lb (70.8 kg)  Height: 5' 4 (1.626 m)  PainSc: 0-No pain   Body mass index is 26.78 kg/m.     12/27/2023    9:21 AM 12/25/2022   11:23 AM 11/30/2021    9:33 AM 03/28/2021    4:00 AM 03/27/2021    7:46 PM 03/20/2021    4:00 AM 03/20/2021    2:12 AM  Advanced Directives  Does Patient Have a Medical Advance Directive? Yes No Yes Yes Yes Yes Yes  Type of Estate Agent of [place];Living will  Healthcare Power of [place];Living will Healthcare Power of [place];Living will Healthcare Power of [place];Living will Healthcare Power of [place];Living will Healthcare Power of [place];Living will  Does patient want to make changes to medical advance directive?   Yes (MAU/Ambulatory/Procedural Areas - Information given) No - Patient declined No - Patient declined    Copy of Healthcare Power of Attorney in Chart? Yes - validated most recent  copy scanned in chart (See row information)   No - copy requested No - copy requested    Would patient like information on creating a medical advance directive?  No - Patient declined         Current Medications (verified) Outpatient Encounter Medications as of 12/27/2023  Medication Sig   Ascorbic Acid (VITAMIN C) 1000 MG tablet Take 1,000 mg by mouth daily.   aspirin  EC 81 MG tablet Take 81 mg by mouth daily. Swallow whole.   b complex vitamins tablet Take 1 tablet by mouth daily.   clopidogrel  (PLAVIX ) 75 MG tablet Take 1 tablet (75 mg total) by mouth daily.   Cod Liver Oil CAPS Take 1 capsule by mouth daily at 6 (six) AM.   estradiol (CLIMARA - DOSED IN MG/24 HR) 0.05 mg/24hr patch 0.05 mg once a week.   ezetimibe  (ZETIA ) 10 MG tablet Take 5 mg by mouth daily.   Magnesium 250 MG TABS Take 250 mg by mouth daily.   metoprolol  tartrate (LOPRESSOR ) 25 MG tablet Take 0.5 tablets (12.5 mg total) by mouth 2 (two) times daily.   nitroGLYCERIN  (NITROSTAT ) 0.4 MG SL tablet Place 1 tablet (0.4 mg total) under the tongue every 5 (five) minutes as needed for chest pain.   OVER THE COUNTER MEDICATION Take 600 mg by mouth daily at 6 (six) AM. RED YEAST RICE W/ CO Q 10.   OVER THE COUNTER MEDICATION Take 2,000 Units by mouth daily at 6 (six) AM. NATTOKINASE bloodthinner supplement.   Polyethyl Glycol-Propyl Glycol (SYSTANE) 0.4-0.3 %  SOLN Place 1 drop into both eyes 2 (two) times daily.   progesterone  (PROMETRIUM ) 100 MG capsule Take 300 mg by mouth at bedtime.    thyroid  (ARMOUR) 60 MG tablet 60mg  in the AM and 60mg  in the PM.   zinc gluconate 50 MG tablet Take 50 mg by mouth daily.   No facility-administered encounter medications on file as of 12/27/2023.    Allergies (verified) Albuterol , Lovastatin , Pravastatin , Rosuvastatin , and Vytorin [ezetimibe -simvastatin]   History: Past Medical History:  Diagnosis Date   Allergy    Anemia    Arthritis    feet and hands , neck, shoulder   Coronary  artery disease    Dysrhythmia 2006   atrial flutter   Exogenous obesity    Heel spur    Hyperlipidemia    Hypertension    Hypothyroid    Irregular heart beats    Ischemic cardiomyopathy    Palpitations    Prolapsed, anus    uses mag oxide powder nightly    RSD (reflex sympathetic dystrophy)    after surgery on R foot   STEMI (ST elevation myocardial infarction) (HCC)    Stress incontinence, female    Past Surgical History:  Procedure Laterality Date   ABDOMINAL HYSTERECTOMY  2005   BUNIONECTOMY     COLONOSCOPY     CORONARY ANGIOPLASTY     CORONARY STENT INTERVENTION N/A 03/20/2021   Procedure: CORONARY STENT INTERVENTION;  Surgeon: Dann Candyce RAMAN, MD;  Location: [facility] INVASIVE CV LAB;  Service: Cardiovascular;  Laterality: N/A;   CORONARY/GRAFT ACUTE MI REVASCULARIZATION N/A 03/20/2021   Procedure: Coronary/Graft Acute MI Revascularization;  Surgeon: Dann Candyce RAMAN, MD;  Location[facility] INVASIVE CV LAB;  Service: Cardiovascular;  Laterality: N/A;   DILATION AND CURETTAGE OF UTERUS     LEFT HEART CATH AND CORONARY ANGIOGRAPHY N/A 03/20/2021   Procedure: LEFT HEART CATH AND CORONARY ANGIOGRAPHY;  Surgeon: Dann Candyce RAMAN, MD;  Location: [facility] INVASIVE CV LAB;  Service: Cardiovascular;  Laterality: N/A;   REVERSE SHOULDER ARTHROPLASTY Right 07/15/2020   Procedure: REVERSE SHOULDER ARTHROPLASTY;  Surgeon: Dozier Soulier, MD;  Location: [facility] ORS;  Service: Orthopedics;  Laterality: Right;   Family History  Problem Relation Age of Onset   Arthritis Mother    Hypertension Mother    Colon polyps Mother    Heart disease Father    Hypertension Father    Arthritis Father    Stroke Father    Heart failure Brother    Diabetes Brother    Pancreatic cancer Brother    Colon cancer Neg Hx    Breast cancer Neg Hx    Esophageal cancer Neg Hx    Rectal cancer Neg Hx    Stomach cancer Neg Hx    Social History   Socioeconomic History   Marital status: Married    Spouse name:  Not on file   Number of children: 3   Years of education: 12   Highest education level: Not on file  Occupational History   Occupation: Retired  Tobacco Use   Smoking status: Never   Smokeless tobacco: Never  Vaping Use   Vaping status: Never Used  Substance and Sexual Activity   Alcohol use: No   Drug use: No   Sexual activity: Yes  Other Topics Concern   Not on file  Social History Narrative   Married 1972   2 daughters (Zane Dage is one) and 1 son   9 grandkids    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   Social Drivers of Health   Financial Resource Strain: Low Risk  (12/27/2023)   Overall Financial Resource Strain (CARDIA)    Difficulty of Paying Living Expenses: Not hard at all  Food Insecurity: No Food Insecurity (12/27/2023)   Hunger Vital Sign    Worried About Running Out of Food in the Last Year: Never true    Ran Out of Food in the Last Year: Never true  Transportation Needs: No Transportation Needs (12/27/2023)   PRAPARE - Administrator, Civil Service (Medical): No    Lack of Transportation (Non-Medical): No  Physical Activity: Insufficiently Active (12/27/2023)   Exercise Vital Sign    Days of Exercise per Week: 5 days    Minutes of Exercise per Session: 20 min  Stress: No Stress Concern Present (12/27/2023)   [employer] of Occupational Health - Occupational Stress Questionnaire    Feeling of Stress : Not at all  Social Connections: Socially Integrated (12/27/2023)   Social Connection and Isolation Panel [NHANES]    Frequency of Communication with Friends and Family: More than three times a week    Frequency of Social Gatherings with Friends and Family: Once a week    Attends Religious Services: More than 4 times per year    Active Member of [employer] or Organizations: Yes    Attends Engineer, Structural: More than 4 times per year    Marital Status: Married    Tobacco Counseling Counseling given: Not Answered  Clinical  Intake:  Pre-visit preparation completed: Yes  Pain : No/denies pain Pain Score: 0-No pain   BMI - recorded: 26.78 Nutritional Status: BMI 25 -29 Overweight Nutritional Risks: None Diabetes: No  How often do you need to have someone help you when you read instructions, pamphlets, or other written materials from your doctor or pharmacy?: 1 - Never  Interpreter Needed?: No  Comments: lives with husband Information entered by :: B.Aryam Zhan,LPN   Activities of Daily Living    12/27/2023    9:21 AM  In your present state of health, do you have any difficulty performing the following activities:  Hearing? 0  Vision? 0  Difficulty concentrating or making decisions? 0  Walking or climbing stairs? 0  Dressing or bathing? 0  Doing errands, shopping? 0  Preparing Food and eating ? N  Using the Toilet? N  In the past six months, have you accidently leaked urine? N  Do you have problems with loss of bowel control? N  Managing your Medications? N  Managing your Finances? N  Housekeeping or managing your Housekeeping? N   Patient Care Team: Cleatus Arlyss RAMAN, MD as PCP - General (Family Medicine) Dann Candyce RAMAN, MD as PCP - Cardiology (Cardiology)  Indicate any recent Medical Services you may have received from other than [facility] providers in the past year (date may be approximate).     Assessment:   This is a routine wellness examination for Alfred.  Hearing/Vision screen Hearing Screening - Comments:: Pt says her hearing is good Vision Screening - Comments:: Pt vision is good w/glasses Dr Dal   Goals Addressed             This Visit's Progress    COMPLETED: DIET - EAT MORE FRUITS AND VEGETABLES   On track    COMPLETED: Increase physical activity   On track    Starting 04/24/2018, I will continue to exercise for 20-30 minutes 5 days per week.      COMPLETED:  Patient Stated   On track    09/05/2019, Patient wants to maintain her current status and not change  anything right now     COMPLETED: Patient Stated   On track    Would like to maintain current routine        Depression Screen    12/27/2023    9:18 AM 12/25/2022   11:21 AM 11/30/2021    9:37 AM 07/13/2021    2:23 PM 05/31/2021    3:40 PM 11/30/2020    9:37 AM 11/26/2020   11:04 AM  PHQ 2/9 Scores  PHQ - 2 Score 0 0 0 0 0 0 0  PHQ- 9 Score  0    0 0    Fall Risk    12/27/2023    9:17 AM 01/22/2023   10:34 AM 12/25/2022   11:23 AM 11/30/2021    9:35 AM 05/31/2021    1:31 PM  Fall Risk   Falls in the past year? 0 0 0 0 0  Number falls in past yr: 0 0 0 0   Injury with Fall? 0 0 0 0   Risk for fall due to : No Fall Risks No Fall Risks No Fall Risks No Fall Risks   Follow up Education provided;Falls prevention discussed Falls evaluation completed Falls prevention discussed;Falls evaluation completed Falls prevention discussed Falls evaluation completed    MEDICARE RISK AT HOME: Medicare Risk at Home Any stairs in or around the home?: Yes If so, are there any without handrails?: Yes Home free of loose throw rugs in walkways, pet beds, electrical cords, etc?: Yes Adequate lighting in your home to reduce risk of falls?: Yes Life alert?: No Use of a cane, walker or w/c?: No Grab bars in the bathroom?: Yes Shower chair or bench in shower?: Yes Elevated toilet seat or a handicapped toilet?: Yes  TIMED UP AND GO:  Was the test performed?  No    Cognitive Function:    11/26/2020   11:05 AM 09/05/2019    2:30 PM 04/24/2018   12:48 PM  MMSE - Mini Mental State Exam  Orientation to time 5 5 5   Orientation to Place 5 5 5   Registration 3 3 3   Attention/ Calculation 5 5 0  Recall 3 3 3   Language- name 2 objects   0  Language- repeat 1 1 1   Language- follow 3 step command   3  Language- read & follow direction   0  Write a sentence   0  Copy design   0  Total score   20        12/27/2023    9:22 AM 12/25/2022   11:26 AM  6CIT Screen  What Year? 0 points 0 points  What  month? 0 points 0 points  What time? 0 points 3 points  Count back from 20 0 points 0 points  Months in reverse 0 points 0 points  Repeat phrase 2 points 6 points  Total Score 2 points 9 points    Immunizations Immunization History  Administered Date(s) Administered   Influenza Split 11/10/2011   Influenza, Seasonal, Injecte, Preservative Fre 11/18/2012   Influenza,inj,Quad PF,6+ Mos 09/15/2019   Pneumococcal Conjugate-13 03/20/2017   Pneumococcal Polysaccharide-23 04/24/2018   Tdap 12/18/2005    TDAP status: Up to date  Flu Vaccine status: Declined, Education has been provided regarding the importance of this vaccine but patient still declined. Advised may receive this vaccine at local pharmacy or Health Dept. Aware to  provide a copy of the vaccination record if obtained from local pharmacy or Health Dept. Verbalized acceptance and understanding.  Pneumococcal vaccine status: Up to date  Covid-19 vaccine status: Declined, Education has been provided regarding the importance of this vaccine but patient still declined. Advised may receive this vaccine at local pharmacy or Health Dept.or vaccine clinic. Aware to provide a copy of the vaccination record if obtained from local pharmacy or Health Dept. Verbalized acceptance and understanding.  Qualifies for Shingles Vaccine? Yes   Zostavax completed No   Shingrix Completed?: No.    Education has been provided regarding the importance of this vaccine. Patient has been advised to call insurance company to determine out of pocket expense if they have not yet received this vaccine. Advised may also receive vaccine at local pharmacy or Health Dept. Verbalized acceptance and understanding.  Screening Tests Health Maintenance  Topic Date Due   Zoster Vaccines- Shingrix (1 of 2) Never done   DTaP/Tdap/Td (2 - Td or Tdap) 12/19/2015   INFLUENZA VACCINE  07/19/2023   MAMMOGRAM  08/15/2024   Medicare Annual Wellness (AWV)  12/26/2024    Colonoscopy  06/01/2027   Pneumonia Vaccine 46+ Years old  Completed   DEXA SCAN  Completed   Hepatitis C Screening  Completed   HPV VACCINES  Aged Out   COVID-19 Vaccine  Discontinued    Health Maintenance  Health Maintenance Due  Topic Date Due   Zoster Vaccines- Shingrix (1 of 2) Never done   DTaP/Tdap/Td (2 - Td or Tdap) 12/19/2015   INFLUENZA VACCINE  07/19/2023    Colorectal cancer screening: Type of screening: Colonoscopy. Completed 05/31/2017. Repeat every 10 years  Mammogram status: Completed 08/15/2022. Repeat every year  Bone Density status: Completed 02/11/2021. Results reflect: Bone density results: NORMAL. Repeat every 5 years.  Lung Cancer Screening: (Low Dose CT Chest recommended if Age 27-80 years, 20 pack-year currently smoking OR have quit w/in 15years.) does not qualify.   Lung Cancer Screening Referral: no  Additional Screening:  Hepatitis C Screening: does not qualify; Completed 12/18/2006  Vision Screening: Recommended annual ophthalmology exams for early detection of glaucoma and other disorders of the eye. Is the patient up to date with their annual eye exam?  Yes  Who is the provider or what is the name of the office in which the patient attends annual eye exams? Dr Lathan If pt is not established with a provider, would they like to be referred to a provider to establish care? No .   Dental Screening: Recommended annual dental exams for proper oral hygiene  Diabetic Foot Exam: n/a  Community Resource Referral / Chronic Care Management: CRR required this visit?  No   CCM required this visit?  No   Plan:     I have personally reviewed and noted the following in the patient's chart:   Medical and social history Use of alcohol, tobacco or illicit drugs  Current medications and supplements including opioid prescriptions. Patient is not currently taking opioid prescriptions. Functional ability and status Nutritional status Physical  activity Advanced directives List of other physicians Hospitalizations, surgeries, and ER visits in previous 12 months Vitals Screenings to include cognitive, depression, and falls Referrals and appointments  In addition, I have reviewed and discussed with patient certain preventive protocols, quality metrics, and best practice recommendations. A written personalized care plan for preventive services as well as general preventive health recommendations were provided to patient.   Erminio LITTIE Saris, LPN   8/[id number]/7974  After Visit Summary: (MyChart) Due to this being a telephonic visit, the after visit summary with patients personalized plan was offered to patient via MyChart   Nurse Notes: The patient states she is doing well and has no concerns or questions at this time.

## 2023-12-27 NOTE — Patient Instructions (Signed)
 Ms. Solivan , Thank you for taking time to come for your Medicare Wellness Visit. I appreciate your ongoing commitment to your health goals. Please review the following plan we discussed and let me know if I can assist you in the future.   Referrals/Orders/Follow-Ups/Clinician Recommendations: none  This is a list of the screening recommended for you and due dates:  Health Maintenance  Topic Date Due   Zoster (Shingles) Vaccine (1 of 2) Never done   DTaP/Tdap/Td vaccine (2 - Td or Tdap) 12/19/2015   Flu Shot  07/19/2023   Mammogram  08/15/2024   Medicare Annual Wellness Visit  12/26/2024   Colon Cancer Screening  06/01/2027   Pneumonia Vaccine  Completed   DEXA scan (bone density measurement)  Completed   Hepatitis C Screening  Completed   HPV Vaccine  Aged Out   COVID-19 Vaccine  Discontinued    Advanced directives: (Copy Requested) Please bring a copy of your health care power of attorney and living will to the office to be added to your chart at your convenience.  Next Medicare Annual Wellness Visit scheduled for next year: Yes 12/29/2024 @ 10:50am televisit

## 2024-01-28 ENCOUNTER — Ambulatory Visit (INDEPENDENT_AMBULATORY_CARE_PROVIDER_SITE_OTHER): Payer: PPO | Admitting: Family Medicine

## 2024-01-28 ENCOUNTER — Encounter: Payer: Self-pay | Admitting: Family Medicine

## 2024-01-28 VITALS — BP 118/64 | HR 76 | Temp 98.5°F | Ht 62.4 in | Wt 161.4 lb

## 2024-01-28 DIAGNOSIS — Z7189 Other specified counseling: Secondary | ICD-10-CM

## 2024-01-28 DIAGNOSIS — E7800 Pure hypercholesterolemia, unspecified: Secondary | ICD-10-CM | POA: Diagnosis not present

## 2024-01-28 DIAGNOSIS — R1012 Left upper quadrant pain: Secondary | ICD-10-CM

## 2024-01-28 DIAGNOSIS — I2510 Atherosclerotic heart disease of native coronary artery without angina pectoris: Secondary | ICD-10-CM | POA: Diagnosis not present

## 2024-01-28 DIAGNOSIS — Z79890 Hormone replacement therapy (postmenopausal): Secondary | ICD-10-CM | POA: Diagnosis not present

## 2024-01-28 DIAGNOSIS — E039 Hypothyroidism, unspecified: Secondary | ICD-10-CM

## 2024-01-28 DIAGNOSIS — Z0000 Encounter for general adult medical examination without abnormal findings: Secondary | ICD-10-CM

## 2024-01-28 DIAGNOSIS — R413 Other amnesia: Secondary | ICD-10-CM

## 2024-01-28 LAB — CBC WITH DIFFERENTIAL/PLATELET
Basophils Absolute: 0 10*3/uL (ref 0.0–0.1)
Basophils Relative: 0.6 % (ref 0.0–3.0)
Eosinophils Absolute: 0.2 10*3/uL (ref 0.0–0.7)
Eosinophils Relative: 2.7 % (ref 0.0–5.0)
HCT: 44.6 % (ref 36.0–46.0)
Hemoglobin: 14.8 g/dL (ref 12.0–15.0)
Lymphocytes Relative: 23.6 % (ref 12.0–46.0)
Lymphs Abs: 1.5 10*3/uL (ref 0.7–4.0)
MCHC: 33.1 g/dL (ref 30.0–36.0)
MCV: 95.3 fL (ref 78.0–100.0)
Monocytes Absolute: 0.4 10*3/uL (ref 0.1–1.0)
Monocytes Relative: 5.9 % (ref 3.0–12.0)
Neutro Abs: 4.4 10*3/uL (ref 1.4–7.7)
Neutrophils Relative %: 67.2 % (ref 43.0–77.0)
Platelets: 270 10*3/uL (ref 150.0–400.0)
RBC: 4.68 Mil/uL (ref 3.87–5.11)
RDW: 14.2 % (ref 11.5–15.5)
WBC: 6.5 10*3/uL (ref 4.0–10.5)

## 2024-01-28 LAB — COMPREHENSIVE METABOLIC PANEL
ALT: 11 U/L (ref 0–35)
AST: 15 U/L (ref 0–37)
Albumin: 4.4 g/dL (ref 3.5–5.2)
Alkaline Phosphatase: 79 U/L (ref 39–117)
BUN: 12 mg/dL (ref 6–23)
CO2: 25 meq/L (ref 19–32)
Calcium: 9.2 mg/dL (ref 8.4–10.5)
Chloride: 104 meq/L (ref 96–112)
Creatinine, Ser: 0.85 mg/dL (ref 0.40–1.20)
GFR: 68.4 mL/min (ref >60.00)
Glucose, Bld: 103 mg/dL — ABNORMAL HIGH (ref 70–99)
Potassium: 4.4 meq/L (ref 3.5–5.1)
Sodium: 142 meq/L (ref 135–145)
Total Bilirubin: 0.4 mg/dL (ref 0.2–1.2)
Total Protein: 6.9 g/dL (ref 6.0–8.3)

## 2024-01-28 LAB — LIPID PANEL
Cholesterol: 300 mg/dL — ABNORMAL HIGH (ref 0–200)
HDL: 56.8 mg/dL (ref >39.00)
LDL Cholesterol: 192 mg/dL — ABNORMAL HIGH (ref 0–99)
NonHDL: 243.09
Total CHOL/HDL Ratio: 5
Triglycerides: 253 mg/dL — ABNORMAL HIGH (ref 0.0–149.0)
VLDL: 50.6 mg/dL — ABNORMAL HIGH (ref 0.0–40.0)

## 2024-01-28 LAB — TSH: TSH: 5.85 u[IU]/mL — ABNORMAL HIGH (ref 0.35–5.50)

## 2024-01-28 LAB — VITAMIN B12: Vitamin B-12: 337 pg/mL (ref 211–911)

## 2024-01-28 NOTE — Patient Instructions (Addendum)
 Go to the lab on the way out.   If you have mychart we'll likely use that to update you.    Take care.  Glad to see you. I would ask the managing clinic about the plan for HRT.

## 2024-01-28 NOTE — Progress Notes (Signed)
Memory loss.  Short term changes.  Had neuropsych eval. I do not yer have final report with interpretation from neuropsych clinic.     CAD.     Using medication without problems or lightheadedness: yes Chest pain with exertion:no Edema:no Short of breath:no Labs pending.  Able to walk 1/2 mile without trouble from CP.  She has hip and knee pain but not CP.     Has a living will.  Husband is designated if patient were incapacitated. Then daughter [place] designated if husband is incapacitated.   Colonoscopy up to date.  Prev DXA wnl.    Some occ L sided abd pain.  Variable severity.  Going on for months.  No vomiting, no blood in stool.  No fevers. No chills.  No change with BM or eating.  It doesn't stop her from doing activity during the day.      D/w pt about follow up with outside clinic re: HRT.  Discussed that she can talk to them about risk and benefit.  She doesn't recall the indication she had for use.  Advised to follow up with the rx'ing clinic about that.   Prev abnormal TSH, d/w about mgmt per outside clinic, f/u TSH pending.   Meds, vitals, and allergies reviewed.   ROS: Per HPI unless specifically indicated in ROS section   GEN: nad, alert HEENT: ncat NECK: supple w/o LA CV: rrr.  PULM: ctab, no inc wob ABD: soft, +bs, no rebound, no mass.   EXT: no edema SKIN: no acute rash

## 2024-01-30 ENCOUNTER — Other Ambulatory Visit: Payer: Self-pay | Admitting: Family Medicine

## 2024-01-30 ENCOUNTER — Telehealth: Payer: Self-pay | Admitting: Family Medicine

## 2024-01-30 DIAGNOSIS — R1012 Left upper quadrant pain: Secondary | ICD-10-CM | POA: Insufficient documentation

## 2024-01-30 NOTE — Assessment & Plan Note (Signed)
Has a living will. Husband is designated if patient were incapacitated.Then daughter [place] designated if husband is incapacitated.

## 2024-01-30 NOTE — Telephone Encounter (Signed)
I saw this patient in follow up.  I need the final report re: her testing.  Thank you.

## 2024-01-30 NOTE — Assessment & Plan Note (Signed)
Unclear source.  Going on for months.  Colonoscopy up to date.  See notes on labs.  D/w pt about imaging if labs unremarkable.

## 2024-01-30 NOTE — Assessment & Plan Note (Addendum)
Short term changes.  Had neuropsych eval. I do not yer have final report with interpretation from neuropsych clinic.  I sent a note requesting the final report.

## 2024-01-30 NOTE — Assessment & Plan Note (Signed)
D/w pt about follow up with outside clinic re: HRT.  Discussed that she can talk to them about risk and benefit.  She doesn't recall the indication she had for use.  Advised to follow up with the rx'ing clinic about that.

## 2024-01-30 NOTE — Assessment & Plan Note (Signed)
Labs pending.  Able to walk 1/2 mile without trouble from CP.  She has hip and knee pain but not CP.   Continue aspirin plavix zetia metoprolol.

## 2024-01-30 NOTE — Assessment & Plan Note (Signed)
Prev abnormal TSH, d/w about mgmt per outside clinic, f/u TSH pending.

## 2024-01-30 NOTE — Assessment & Plan Note (Signed)
Has a living will.  Husband is designated if patient were incapacitated. Then daughter [place] designated if husband is incapacitated.   Colonoscopy up to date.  Prev DXA wnl.

## 2024-02-04 ENCOUNTER — Other Ambulatory Visit: Payer: Self-pay | Admitting: Physician Assistant

## 2024-02-17 NOTE — Telephone Encounter (Signed)
 Please call Dr. Marvetta Gibbons office and see when we can expect his report.  Thanks.

## 2024-02-20 NOTE — Telephone Encounter (Signed)
 Please update patient.  We have tried to get this report in the meantime.  Thanks.

## 2024-02-20 NOTE — Telephone Encounter (Signed)
 Called Dr. Kieth Brightly office and spoke with Roanna Raider. Per Roanna Raider although she had the testing the provider will not write the report until 04/07/24.

## 2024-02-22 NOTE — Telephone Encounter (Signed)
 Spoke with patients spouse who is authorized on DPR and advised that we should hear something 04/07/24

## 2024-03-16 ENCOUNTER — Telehealth: Payer: Self-pay | Admitting: Family Medicine

## 2024-03-16 NOTE — Telephone Encounter (Signed)
 When she she going for her CT?  Does she still have abdominal symptoms?

## 2024-03-18 NOTE — Telephone Encounter (Signed)
 [facility] has been trying to the reach the patient to schedule this CT.  See appt desk notes on order  02/04/2023-2nd attempt LMOM/LM 01/31/2024-1st attempt LMOM/LM   They want to schedule directly with the patient, they have left messages, the patient can call them back to get this set up.

## 2024-03-18 NOTE — Telephone Encounter (Signed)
 Spoke with pt and her husband. They would still like to proceed with getting this CT done. They would prefer that we set this up for them. Message will be routed to the referral team to handle. They would like this scheduled early afternoon and in [place].

## 2024-03-19 NOTE — Telephone Encounter (Signed)
 Return call to patient and spoke with spouse who is on DPR. I did advise that Imaging has attempted to call them but they can reach out. Provided the number [phone]

## 2024-03-19 NOTE — Telephone Encounter (Signed)
 Noted. Thanks.

## 2024-03-23 NOTE — Telephone Encounter (Signed)
 I also got prelim report re: cognitive testing.  Awaiting final report.  Per. Dr. Kieth Brightly, the most likely issue was a sudden change related to her cardiac event.  If she has progressive changes, then let me know.

## 2024-03-24 NOTE — Telephone Encounter (Signed)
 Left voicemail for patient to return call to office.

## 2024-03-25 NOTE — Telephone Encounter (Signed)
 Left voicemail for patient to return call to office.

## 2024-03-25 NOTE — Telephone Encounter (Signed)
 Call transferred from [facility] relayed Dr Para March message patients spouse verbalized understanding

## 2024-04-07 ENCOUNTER — Encounter: Attending: Psychology | Admitting: Psychology

## 2024-04-07 DIAGNOSIS — F0280 Dementia in other diseases classified elsewhere without behavioral disturbance: Secondary | ICD-10-CM

## 2024-04-07 DIAGNOSIS — R413 Other amnesia: Secondary | ICD-10-CM | POA: Diagnosis not present

## 2024-04-07 DIAGNOSIS — Z955 Presence of coronary angioplasty implant and graft: Secondary | ICD-10-CM | POA: Diagnosis not present

## 2024-04-07 DIAGNOSIS — I2510 Atherosclerotic heart disease of native coronary artery without angina pectoris: Secondary | ICD-10-CM | POA: Insufficient documentation

## 2024-04-07 DIAGNOSIS — I252 Old myocardial infarction: Secondary | ICD-10-CM | POA: Diagnosis not present

## 2024-04-07 DIAGNOSIS — R2990 Unspecified symptoms and signs involving the nervous system: Secondary | ICD-10-CM | POA: Insufficient documentation

## 2024-04-07 NOTE — Progress Notes (Signed)
 Neuropsychological Evaluation   Patient:  Kim Brown   DOB: June 06, 1951  MR Number: [id number]  Location: [facility] [place] [address] [place], [address] [place] [address] Dept: [phone]  Start: 3 PM End: 4 PM  Provider/Observer:     Marrion Sjogren PsyD  Chief Complaint:      Chief Complaint  Patient presents with   Memory Loss    Reason For Service:     Venise Ellingwood is a 73 year old female with a past medical history including coronary artery disease and STEMI in 2022.  Patient has reported ongoing issues with fatigue and lethargy and reporting changes in memory and cognition shortly after her heart attack.  The patient and husband report that after Cath Lab/multiple stent placement the patient felt very good and much better than she had been for 3 days.  When she was at the cardiac rehab program 3 days later she suddenly felt sudden fatigue and other difficulties and has had difficulty ever since.  Full workup has not identified any particular cause for this and the patient did not have another heart attack.  The patient minimizes her memory changes and reports that she may have had a little bit of memory issues before her heart attack but feels like most of it developed after her heart attack.  The patient's husband reports that there was a rather sudden change 3 days after her heart attack and these cognitive changes have been rather steady over the past 2 years.  Patient's husband reports that she has much greater difficulty with semantic memory issues than episodic memory issues.  The patient has difficulty learning new information such as how to navigate TV channels now that their TV has changed from cable to streaming services.  The patient had difficulty keeping up with recent presidential election information etc.  They both deny any changes in geographic orientation and the patient has  continued to drive without issue.  Other than short-term memory issues and fatigue no other issues are noted.  Patient and husband deny any tremors, auditory or visual hallucinations etc.  They also deny any family history of progressive degenerative conditions.   The patient's husband gives more detail about her fatigue.  The patient is described as rarely feeling good but she herself cannot pinpoint any particular issue that she feels bad about other than being tired.  She is constantly telling her husband that she feels tired and fatigued.  There have been attempts at changing medications and stopping all medications that were not essential to see if some of her changes were medication induced.  The patient also has a history of anxiety and describes feeling jittery but this is more episodic along with her weakness.  This anxiety is described to last for 30 minutes and then returns back to baseline.  The symptoms have been present since her cardiac event and descriptions previously with her PCP her memory issues were described as episodic lapses and word searching and conversation and getting details confused.  She is not described as ever having issues with orientation.   The patient does have some abnormalities in sleep pattern.  The patient is described as going to sleep around 1:30 AM and describes feeling her best during the day between 11 PM and 1 AM.  She then goes to bed around 1:30 a.m. and typically wakes up between 9 or 10 AM.  She is described as sleeping a  full 8 hours but does not feel rested in the morning.   Medical History:                         Past Medical History:  Diagnosis Date   Allergy     Anemia     Arthritis      feet and hands , neck, shoulder   Coronary artery disease     Dysrhythmia 2006    atrial flutter   Exogenous obesity     Heel spur     Hyperlipidemia     Hypertension     Hypothyroid     Irregular heart beats     Ischemic cardiomyopathy     Palpitations      Prolapsed, anus      uses mag oxide powder nightly    RSD (reflex sympathetic dystrophy)      after surgery on R foot   STEMI (ST elevation myocardial infarction) (HCC)     Stress incontinence, female                                                                 Patient Active Problem List    Diagnosis Date Noted   Neck pain 06/28/2022   Ear pain 02/20/2022   Memory change 02/20/2022   Statin myopathy 11/02/2021   CAD (coronary artery disease) 03/28/2021   Near syncope 03/27/2021   Acute systolic heart failure (HCC) 03/22/2021   Acute anterior wall MI (HCC) 03/20/2021   Lower urinary tract symptoms (LUTS) 12/01/2020   Hormone replacement therapy (HRT) 12/01/2020   Hypothyroidism 12/01/2020   Presence of orthotic device 12/01/2020   Dysuria 09/18/2019   Shoulder pain 09/18/2019   Healthcare maintenance 05/01/2018   Allergic urticaria 01/29/2018   Perennial allergic rhinitis 01/29/2018   Allergic reaction 01/29/2018   Advance care planning 03/21/2017   Constipation 06/22/2013   Dyspnea 06/22/2013   Hyperlipidemia 09/07/2011      Additional Tests and Measures from other records:   Neuroimaging Results: Patient had a CT head without contrast performed on 03/10/2022 due to memory loss and altered mental status.  No acute abnormalities were noted and no descriptions of unusual or causative factors noted such as mass, lesion or acute infarction or significant intracranial injury.   Laboratory Tests: Patient has regularly had low TSH noted in workups.  Other laboratory workups have been generally within normal limits including B12.  Tests Administered: Controlled Oral Word Association Test (COWAT; FAS & Animals)  Grooved Pegboard Wechsler Adult Intelligence Scale, 4th Edition (WAIS-IV) Wechsler Memory Scale, 4th Edition (WMS-IV); Older Adult Battery  Participation Level:   Active  Participation Quality:  Redirectable      Behavioral Observation:  The patient  appeared well-groomed and appropriately dressed. Her manners were polite and appropriate to the situation. The patient's attitude towards testing was positive and her effort was decent. The patient appeared quite fatigued and at times anxious during testing and took frequent breaks. The patient also became frustrated during the Verbal Paired Associates portion of the [facility] and therefore did not complete that part of the test.   Well Groomed, Alert, and  times of fatigue and anxiety that were episodic with breaks taken .   Test Results:  Initially, an estimation was made as to premorbid intellectual and cognitive functioning to provide a comparison point for current obtained neuropsychological test data.  The patient graduated from college after completing a 2-year associates degree at [employer] maintaining a perfect GPA average.  Patient took business classes primarily.  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  No issues with maintenance of employment were ever noted.  It is estimated that the patient likely was in the high average range relative to a normative population looking at this various psychosocial features.  She likely performed roughly 1 standard deviation higher than her normative comparison group with some individual cognitive areas potentially better and others less of.  Secondly, the validity of the current testing situation was reviewed.  The patient had times of fatigue as well as episodes of anxiety and when these were being identified brakes were taken when appropriate.  There was also a time during the verbal paired associate subtest from the Wechsler Memory Scale where the measure was discontinued because of frustration.  These will be taken into account in the interpretation.  COWAT:  FAS total= 21 Z= -1.08 Animals total= 10 Z= -1.53  The patient had described to her PCP  difficulties with word searching and following conversations and getting details confused.  Because of these word finding difficulties noted the controlled oral Word Association test was administered to look at lexical and semantic fluency variables.  On the FAS test which assesses lexical fluency, the patient produced 21 total words which was just over 1 standard deviations below normative expectations.  This is below where her likely lifelong functioning has been and it was probably a particular strength for her historically given her work history.  Given the fact that it was at the very beginning of the testing procedures this would not be due to fatigue or lethargy as the symptoms appear to present in the middle or end of the testing.  The patient had a similar performance on the animal naming portion where she performed 1 and half standard deviations below normative expectations   Grooved Pegboard:  R (DH) time= 88s Percentile Rank= 41st L (NDH) time= 157s Percentile Rank= 32nd  The patient was also administered the grooved pegboard test to assess for possibility of lateralization of findings.  The patient performed consistently between her right dominant hand and left on dominant hand on measures of fine motor control performing at the 41st and 32nd percentile respectively.  These 2 performances were close enough given hand dominance factors but there was not any indication of lateralization of findings indicative of stroke or some other focal deficit.   WAIS-IV:              Composite Score Summary          Scale Sum of Scaled Scores Composite Score Percentile Rank 95% Conf. Interval Qualitative Description  Verbal Comprehension 23 VCI 87 19 82-93 Low Average  Perceptual Reasoning 24 PRI 88 21 82-95 Low Average  Working Memory 16 WMI 89 23 83-96 Low Average  Processing Speed 11 PSI 76 5 70-87 Borderline  Full Scale 74 FSIQ 82 12 78-86 Low Average  General Ability 47 GAI 86 18 81-91  Low Average    In order to assess a wide range of cognitive domains and a highly structured well normed measure the patient was administered the Wechsler Adult Intelligence Scale-IV.  As  the patient is describing some significant changes in overall cognition her current score should not be viewed as indicative of her lifelong status but a reflection of her current cognitive capacities.  2 composite/Global scores were calculated.  The patient produced a full-scale IQ score of 82 which falls at the 12 percentile and is in the low average range relative to a normative population.  This likely represents at least 2 standard deviations below predicted levels of premorbid functioning suggesting global widespread cognitive change from baseline.  We also calculated the patient's general abilities index score which places less emphasis on issues such as auditory encoding and information processing speed.  The patient's performance improved slightly under this scenario where she produced a general abilities index score of 86 that fell at the 18th percentile.  This is still roughly 2 standard deviations below predicted levels of premorbid functioning again suggesting a wide range of cognitive veins being affected.  Looking at individual or specific composite domains the patient shows consistency across the board with her best relative function area having to do with working memory/auditory encoding and her greatest difficulty having to do with processing speed and focus execute abilities.  These were all within within 1 standard deviation of each other.  She perform in the low average to borderline range on all cognitive domains assessed in this battery again suggesting global cognitive change.          Verbal Comprehension Subtests Summary        Subtest Raw Score Scaled Score Percentile Rank Reference Group Scaled Score SEM  Similarities 20 8 25 7  0.95  Vocabulary 32 9 37 9 0.67  Information 7 6 9 6  0.73   The  patient produced a verbal comprehension index score of 87 which falls at the 19th percentile and is in low average range relative to a normative population.  There was some scatter noted in subtest performance with the patient performing in the average range on measures of vocabulary knowledge and verbal reasoning capacity although both of them were below predicted levels of premorbid functioning.  The patient had more difficulty retrieving information at likely of a long-term stored information producing and information scaled score of 6.  This does suggest difficulty with retrieving factual type of information that should have been more efficient than it was on the current measure.  Perceptual Reasoning Subtests Summary        Subtest Raw Score Scaled Score Percentile Rank Reference Group Scaled Score SEM  Block Design 28 9 37 6 0.99  Matrix Reasoning 8 8 25 4  0.90  Visual Puzzles 7 7 16 5  0.99   The patient produced a perceptual reasoning index score of 88 which falls at the 21st percentile and is in the low average range relative to a normative population.  There was little scatter noted on subtest performance with the patient performing in the average to low average range on measures of visual analysis and organization, visual reasoning and problem-solving, capacity to perform part whole comparisons and visual reasoning and estimation/judgment capacities.  These are significantly below her premorbid levels of functioning ranging between 1-2 standard deviations below predicted levels of premorbid functioning.  Working Comptroller Raw Score Scaled Score Percentile Rank Reference Group Scaled Score SEM  Digit Span 23 9 37 7 0.73  Arithmetic 10 7 16 7  1.20   The patient produced a working memory index score of 89 which falls at the  23rd percentile and is in the low average range relative to a normative population.  There was some scatter noted.  The patient performed in the  average range on measures of auditory coding capacity and had some relative weaknesses or greater difficulty when asked to actively process information in her auditory Register.  Processing Speed Subtests Summary        Subtest Raw Score Scaled Score Percentile Rank Reference Group Scaled Score SEM  Symbol Search 12 5 5 3  1.12  Coding 31 6 9 3  1.12   The patient produced a processing speed index score of 76 which falls at the 5th percentile and is in the borderline range relative to a normative population.  There was little scatter noted in subtest performance and the patient showing significant impairments both relative to a comparison to normative populations as well as compared to premorbid estimates.  The patient showed difficulties with focus execute attentional components and information processing speed requiring visual scanning and overall speed of mental operations.   WMS-IV:            Index Score Summary        Index Sum of Scaled Scores Index Score Percentile Rank 95% Confidence Interval Qualitative Descriptor  Auditory Memory (AMI) 11 55 0.1 51-63 Extremely Low  Visual Memory (VMI) 6 58 0.3 54-64 Extremely Low  Immediate Memory (IMI) 13 65 1 61-73 Extremely Low  Delayed Memory (DMI) 4 43 <0.1 40-55 Extremely Low    To provide an assessment of a wide range of learning and memory types of components the patient was administered the Wechsler Memory Scale-IV.  On the Wechsler Adult Intelligence Scale the patient showed average abilities to actively encode information auditorily and had some relative weakness being able to process information in her auditory Register.  The patient showed a similar level of performance with regard to visual encoding.  These mild weaknesses could have some impact on initially learning auditory and visual information but should not be overly deleterious to learning and memory.  Breaking memory functions down between auditory versus visual memory the  patient produced an auditory memory index score of 55 which falls below the 1st percentile in the extreme low range.  Similar performance was noted for visual memory where she produced a visual memory index score of 58 that also fell below the 1st percentile.  These are significantly impaired scores and indicate significant difficulties with initially storing and organization of information both auditorily and visually and are well below any impacts that the level of mild relative changes in auditory visual encoding would explain.  Auditory and visual memory were consistent with each other.  Breaking memory functions down between immediate versus delayed, the patient produced an immediate memory index score of 65 which fell at the 1st percentile and is again significantly below premorbid estimations of functioning.  These difficulties with regard to auditory and visual learning were consistent with 1 another.  The patient showed even greater and more profound deficits with delayed recall of information.  While the verbal paired Associates score was severely impaired primary because of the patient's frustration and having to discontinue this subtest her performance is consistent in the home all visual or auditory memory components administered.  The patient had profound deficits retrieving information after period of delay both visually and auditorily and there was a little improvement under recognition/cued recall setting.  The primary deficit appears to be mild weaknesses with regard to encoding ability but significant deficits in storage  and organization and significant loss of information that is initially stored even in his limited capacity.  However, this was a challenging measure for the patient and emotional distress likely played a role to some degree.  However, her performance is consistent with some of the descriptions of her husband regarding the patient's memory difficulties currently.           Primary Subtest Scaled Score Summary       Subtest Domain Raw Score Scaled Score Percentile Rank  Logical Memory I AM 15 4 2   Logical Memory II AM 1 2 0.4  Verbal Paired Associates I AM 5 4 2   Verbal Paired Associates II AM 0 1 0.1  Visual Reproduction I VM 19 5 5   Visual Reproduction II VM 0 1 0.1  Symbol Span VWM 13 8 25           Auditory Memory Process Score Summary      Process Score Raw Score Scaled Score Percentile Rank Cumulative Percentage (Base Rate)  LM II Recognition 17 - - 26-50%  VPA II Recognition 0 - - <=2%         Visual Memory Process Score Summary      Process Score Raw Score Scaled Score Percentile Rank Cumulative Percentage (Base Rate)  VR II Recognition 3 - - 17-25%      Summary of Results:   The results of the current neuropsychological evaluation do show consistency with the patient's subjective reports and objective neuropsychological test data.  Globally, the patient is performing between 1 and 2 standard deviations below predicted levels of premorbid functioning.  There was consistency across a wide range of cognitive domains outside of memory of mild to moderate weaknesses.  This includes visual-spatial and visual processing capacity, ability to retrieve longstanding information, reduction in expressive fluency both semantic and lexical fluency, and decrease in information processing speed/focus execute abilities.  The patient performed only slightly below predicted levels of premorbid functioning for both auditory and visual encoding capacities.  There was no indication of any lateralization of findings on fine motor control task.  The patient's most significant and profound deficits had to do with memory functions.  There was consistencies between visual memory and auditory memory deficits and in-depth analysis of these memory components would suggest that auditory and visual encoding are adequate but the patient's primary deficit has to do with initially storing  and organization of new information and the patient loses a significant amount of information over a period of delay even though there is a limited amount initially encoded.  Patient shows some improvement on recognition/cued recall formats with the exception of the verbal paired Associates measure which was discontinued because of growing frustration with the patient.  It is possible that some if not all of these memory components were affected by apprehension and frustration/fatigue and anxiety but there were consistencies across the board of cognitive difficulties.  Impression/Diagnosis:   The results of the current neuropsychological evaluation looking at subjective reports and timelines of development of cognitive deficits, available medical information and objective neuropsychological evaluation are consistent with a major neurocognitive disorder.  The patient is showing multiple cognitive domains that are impaired including visual-spatial/visual constructional capacities, attentional weaknesses primarily around processing information in her auditory and visual Register and focus execute deficits, retrieval of longstanding knowledge base types information and visual reasoning and problem-solving components.  The most significant deficits were memory which are the primary complaint of the patient and her husband.  These memory deficits  were consistent for auditory and visual information with no indication of lateralization of findings.  While the patient was able to initially encode information when she was asked to actively process that information, which would be needed for storage and organization significant difficulties developed.  Also, the patient had difficulty retrieving information after period of delay with only mild improvement under recognition/cued recall formats.  As far as diagnostic considerations, the timeline is suggested as a rather acute onset roughly 3 days after heart catheterization.   The patient did have head CT that did not identify any acute factors shortly after her sudden change 3 days post cardiac catheterization and stent placement.  The patient reports that she may have been having some memory difficulties even before this procedure but both the patient and husband reports sudden change afterwards.  The patient has global cognitive changes with more profound and significant memory deficits versus mild to moderate deficits in other cognitive domains.  Because of these multiple domains she would meet criterion for major neurocognitive disorder.  It is hard to give a specific causative factor but it would almost invariably be due to cardiovascular/cerebrovascular event.  The timeline and progression of this condition would not be consistent with Alzheimer's or other progressive degenerative type of condition.  As the patient has only had a CT scan as far as I can find in her EMR it may be worthwhile to have an MRI conducted as well.  It is possible that she had an acute cerebrovascular event that was not picked up by CT which is sometimes the case.  In any event, this does appear to likely have a cardiovascular/cerebrovascular component to it potentially due to either subcortical stroke and/or anoxic/hypoxic event.    Diagnosis:    Major neurocognitive disorder due to another medical condition without behavioral disturbance (HCC)  H/O heart artery stent  History of ST elevation myocardial infarction (STEMI)   _____________________ Chapman Commodore, Psy.D. Clinical Neuropsychologist

## 2024-04-10 ENCOUNTER — Ambulatory Visit
Admission: RE | Admit: 2024-04-10 | Discharge: 2024-04-10 | Disposition: A | Discharge status: Home / Self Care | Source: Ambulatory Visit | Attending: Family Medicine | Admitting: Family Medicine

## 2024-04-10 DIAGNOSIS — R1012 Left upper quadrant pain: Secondary | ICD-10-CM

## 2024-04-10 DIAGNOSIS — K829 Disease of gallbladder, unspecified: Secondary | ICD-10-CM | POA: Diagnosis not present

## 2024-04-10 DIAGNOSIS — K5730 Diverticulosis of large intestine without perforation or abscess without bleeding: Secondary | ICD-10-CM | POA: Diagnosis not present

## 2024-04-10 MED ORDER — IOPAMIDOL (ISOVUE-370) INJECTION 76%
80.0000 mL | Freq: Once | INTRAVENOUS | Status: AC | PRN
Start: 2024-04-10 — End: 2024-04-10
  Administered 2024-04-10: 80 mL via INTRAVENOUS

## 2024-04-15 ENCOUNTER — Encounter: Payer: Self-pay | Admitting: Psychology

## 2024-04-15 ENCOUNTER — Encounter (HOSPITAL_BASED_OUTPATIENT_CLINIC_OR_DEPARTMENT_OTHER): Admitting: Psychology

## 2024-04-15 DIAGNOSIS — F0390 Unspecified dementia without behavioral disturbance: Secondary | ICD-10-CM | POA: Diagnosis not present

## 2024-04-15 DIAGNOSIS — F0280 Dementia in other diseases classified elsewhere without behavioral disturbance: Secondary | ICD-10-CM

## 2024-04-15 DIAGNOSIS — R413 Other amnesia: Secondary | ICD-10-CM | POA: Diagnosis not present

## 2024-04-15 NOTE — Progress Notes (Signed)
 Neuropsychological Evaluation   Patient:  Kim Brown   DOB: 04/10/1951  MR Number: [id number]  Location: [facility] [place] [facility] [address], [address] [place] [address] Dept: [phone]  Start: 8 AM End: 9 AM  Provider/Observer:     Marrion Sjogren PsyD  Chief Complaint:      Chief Complaint  Patient presents with   Memory Loss   04/15/2024 8 AM-9 AM: Today I provided feedback regarding the results of the recent neuropsychological evaluation.  This was an in person visit that was conducted in my outpatient clinic office.  We went over the results of the recent neuropsychological evaluation including the objective assessment performed through the my office.  Explanation of the results and resulting diagnostic considerations were conducted.  We also went into greater depth regarding specific recommendations for the patient and her husband going forward.  We talked about potential benefits of having an MRI conducted to help further delineate diagnostic questions.  I have included a copy of the reason for service and summary of my evaluation below for convenience and her full neuropsych evaluation can be found in the patient's EMR dated 04/07/2024.   Reason For Service:     Kim Brown is a 73 year old female with a past medical history including coronary artery disease and STEMI in 2022.  Patient has reported ongoing issues with fatigue and lethargy and reporting changes in memory and cognition shortly after her heart attack.  The patient and husband report that after Cath Lab/multiple stent placement the patient felt very good and much better than she had been for 3 days.  When she was at the cardiac rehab program 3 days later she suddenly felt sudden fatigue and other difficulties and has had difficulty ever since.  Full workup has not identified any particular cause for this and the patient did  not have another heart attack.  The patient minimizes her memory changes and reports that she may have had a little bit of memory issues before her heart attack but feels like most of it developed after her heart attack.  The patient's husband reports that there was a rather sudden change 3 days after her heart attack and these cognitive changes have been rather steady over the past 2 years.  Patient's husband reports that she has much greater difficulty with semantic memory issues than episodic memory issues.  The patient has difficulty learning new information such as how to navigate TV channels now that their TV has changed from cable to streaming services.  The patient had difficulty keeping up with recent presidential election information etc.  They both deny any changes in geographic orientation and the patient has continued to drive without issue.  Other than short-term memory issues and fatigue no other issues are noted.  Patient and husband deny any tremors, auditory or visual hallucinations etc.  They also deny any family history of progressive degenerative conditions.   The patient's husband gives more detail about her fatigue.  The patient is described as rarely feeling good but she herself cannot pinpoint any particular issue that she feels bad about other than being tired.  She is constantly telling her husband that she feels tired and fatigued.  There have been attempts at changing medications and stopping all medications that were not essential to see if some of her changes were medication induced.  The patient also has a history of anxiety and describes feeling jittery  but this is more episodic along with her weakness.  This anxiety is described to last for 30 minutes and then returns back to baseline.  The symptoms have been present since her cardiac event and descriptions previously with her PCP her memory issues were described as episodic lapses and word searching and conversation and getting  details confused.  She is not described as ever having issues with orientation.   The patient does have some abnormalities in sleep pattern.  The patient is described as going to sleep around 1:30 AM and describes feeling her best during the day between 11 PM and 1 AM.  She then goes to bed around 1:30 a.m. and typically wakes up between 9 or 10 AM.  She is described as sleeping a full 8 hours but does not feel rested in the morning.    Impression/Diagnosis:   The results of the current neuropsychological evaluation looking at subjective reports and timelines of development of cognitive deficits, available medical information and objective neuropsychological evaluation are consistent with a major neurocognitive disorder.  The patient is showing multiple cognitive domains that are impaired including visual-spatial/visual constructional capacities, attentional weaknesses primarily around processing information in her auditory and visual Register and focus execute deficits, retrieval of longstanding knowledge base types information and visual reasoning and problem-solving components.  The most significant deficits were memory which are the primary complaint of the patient and her husband.  These memory deficits were consistent for auditory and visual information with no indication of lateralization of findings.  While the patient was able to initially encode information when she was asked to actively process that information, which would be needed for storage and organization significant difficulties developed.  Also, the patient had difficulty retrieving information after period of delay with only mild improvement under recognition/cued recall formats.  As far as diagnostic considerations, the timeline is suggested as a rather acute onset roughly 3 days after heart catheterization.  The patient did have head CT that did not identify any acute factors shortly after her sudden change 3 days post cardiac  catheterization and stent placement.  The patient reports that she may have been having some memory difficulties even before this procedure but both the patient and husband reports sudden change afterwards.  The patient has global cognitive changes with more profound and significant memory deficits versus mild to moderate deficits in other cognitive domains.  Because of these multiple domains she would meet criterion for major neurocognitive disorder.  It is hard to give a specific causative factor but it would almost invariably be due to cardiovascular/cerebrovascular event.  The timeline and progression of this condition would not be consistent with Alzheimer's or other progressive degenerative type of condition.  As the patient has only had a CT scan as far as I can find in her EMR it may be worthwhile to have an MRI conducted as well.  It is possible that she had an acute cerebrovascular event that was not picked up by CT which is sometimes the case.  In any event, this does appear to likely have a cardiovascular/cerebrovascular component to it potentially due to either subcortical stroke and/or anoxic/hypoxic event.    Diagnosis:    Major neurocognitive disorder due to another medical condition without behavioral disturbance (HCC)   _____________________ Chapman Commodore, Psy.D. Clinical Neuropsychologist

## 2024-04-20 ENCOUNTER — Other Ambulatory Visit: Payer: Self-pay | Admitting: Family Medicine

## 2024-04-20 DIAGNOSIS — R413 Other amnesia: Secondary | ICD-10-CM

## 2024-04-24 ENCOUNTER — Encounter: Payer: Self-pay | Admitting: Gastroenterology

## 2024-04-24 ENCOUNTER — Other Ambulatory Visit: Payer: Self-pay | Admitting: Family Medicine

## 2024-04-24 DIAGNOSIS — R1012 Left upper quadrant pain: Secondary | ICD-10-CM

## 2024-05-05 ENCOUNTER — Other Ambulatory Visit

## 2024-05-14 ENCOUNTER — Ambulatory Visit
Admission: RE | Admit: 2024-05-14 | Discharge: 2024-05-14 | Disposition: A | Discharge status: Home / Self Care | Source: Ambulatory Visit | Attending: Family Medicine | Admitting: Family Medicine

## 2024-05-14 DIAGNOSIS — R413 Other amnesia: Secondary | ICD-10-CM

## 2024-05-14 DIAGNOSIS — G319 Degenerative disease of nervous system, unspecified: Secondary | ICD-10-CM | POA: Diagnosis not present

## 2024-05-14 DIAGNOSIS — R9082 White matter disease, unspecified: Secondary | ICD-10-CM | POA: Diagnosis not present

## 2024-05-18 ENCOUNTER — Ambulatory Visit: Payer: Self-pay | Admitting: Family Medicine

## 2024-05-23 ENCOUNTER — Ambulatory Visit: Admitting: Gastroenterology

## 2024-05-23 ENCOUNTER — Encounter: Payer: Self-pay | Admitting: Gastroenterology

## 2024-05-23 VITALS — BP 108/62 | HR 83 | Ht 63.0 in | Wt 160.0 lb

## 2024-05-23 DIAGNOSIS — K219 Gastro-esophageal reflux disease without esophagitis: Secondary | ICD-10-CM | POA: Diagnosis not present

## 2024-05-23 DIAGNOSIS — I252 Old myocardial infarction: Secondary | ICD-10-CM

## 2024-05-23 DIAGNOSIS — R1012 Left upper quadrant pain: Secondary | ICD-10-CM

## 2024-05-23 NOTE — Patient Instructions (Signed)
 Please purchase the following medications over the counter and take as directed: Pepcid , take at night for 6 weeks.  Follow up as needed.  Thank you for trusting me with your gastrointestinal care!   Valiant Gaul, PA-C   _______________________________________________________  If your blood pressure at your visit was 140/90 or greater, please contact your primary care physician to follow up on this.  _______________________________________________________  If you are age 58 or older, your body mass index should be between 23-30. Your Body mass index is 28.34 kg/m. If this is out of the aforementioned range listed, please consider follow up with your Primary Care Provider.  If you are age 29 or younger, your body mass index should be between 19-25. Your Body mass index is 28.34 kg/m. If this is out of the aformentioned range listed, please consider follow up with your Primary Care Provider.   ________________________________________________________  The [facility] providers would like to encourage you to use MYCHART to communicate with providers for non-urgent requests or questions.  Due to long hold times on the telephone, sending your provider a message by [facility] may be a faster and more efficient way to get a response.  Please allow 48 business hours for a response.  Please remember that this is for non-urgent requests.  _______________________________________________________

## 2024-05-23 NOTE — Progress Notes (Signed)
 Kim Brown [id number] 02/08/1951   Chief Complaint: Abdominal pain  Referring Provider: Donnie Galea, MD Primary GI MD: Dr. Bridgett Camps  HPI: Kim Brown is a 73 y.o. female with past medical history of CAD, anemia, HLD, HTN, hypothyroidism, ischemic cardiomyopathy, STEMI 03/2021 s/p stent placement, anal prolapse, memory loss with short-term changes, who presents today for a complaint of LUQ abdominal pain.    Patient seen by PCP 01/28/2024, at that time LUQ abdominal pain already going on for months.  Unclear source.  She had workup including CT A/P in April which showed no acute findings, layering gallbladder sludge versus tiny gallstones, and sigmoid diverticulosis without evidence of diverticulitis.  Labs 01/28/2024: Normal CBC, normal CMP, elevated lipids, elevated TSH 5.85, vitamin B12 337  Patient here today with her husband who helps provide history due to patient's impaired short-term memory.  She reports intermittent left upper quadrant abdominal pain which has been occurring for about the last year.  It feels like pressure just under her left ribs.  Pain can be present first thing in the morning but pattern of pain is unpredictable.  It often will last all day and causes discomfort but does not limit daily activities.  They have actually noticed fewer episodes of pain in the last month.  Previously it was occurring 2-3 times a week.  She states it sometimes feels like her bra is too tight or that the underwire might be bothering her.  She reports very infrequent acid reflux depending on what she eats.  Does not occur often enough for her to have needed reflux medications.  She denies dysphagia or chest pain.  She has a good appetite and her weight has remained stable.  Patient denies changes in her bowel movements.  She has a bowel movement daily and denies diarrhea, constipation, blood in her stool, or melena.  She takes a magnesium supplement at night which helps keep her  regular.  She does have some fatigue which is not new and not worsening, and has been present since her MI in 2022.  She denies any family history of colon or stomach cancer.  Patient denies any shortness of breath or chest pain.  She has not seen cardiology in about a year.  Planning for follow-up with her endocrinologist in July and will have repeat labs at that appointment.  Previous GI Procedures/Imaging   CT A/P 04/10/2024  - No acute findings. - Layering gallbladder sludge versus tiny gallstones. No radiographic evidence of cholecystitis or biliary ductal dilatation. - Mild sigmoid diverticulosis, without radiographic evidence of diverticulitis.  Colonoscopy 05/31/2017 - One 5 mm polyp in the rectum, removed with a cold snare. Resected and retrieved.  - Mild diverticulosis in the sigmoid colon and at the splenic flexure.  - Melanosis in the colon.  - Internal hemorrhoids. - Recall 10 years Path: Surgical [P], rectum, polyp (2) - HYPERPLASTIC POLYP (X2 FRAGMENTS). - NO DYSPLASIA OR MALIGNANCY.  Past Medical History:  Diagnosis Date   Allergy    Anemia    Arthritis    feet and hands , neck, shoulder   Coronary artery disease    Dysrhythmia 2006   atrial flutter   Exogenous obesity    Heel spur    Hyperlipidemia    Hypertension    Hypothyroid    Irregular heart beats    Ischemic cardiomyopathy    Palpitations    Prolapsed, anus    uses mag oxide powder nightly    RSD (reflex sympathetic dystrophy)  after surgery on R foot   STEMI (ST elevation myocardial infarction) (HCC)    Stress incontinence, female     Past Surgical History:  Procedure Laterality Date   ABDOMINAL HYSTERECTOMY  2005   BUNIONECTOMY     COLONOSCOPY     CORONARY ANGIOPLASTY     CORONARY STENT INTERVENTION N/A 03/20/2021   Procedure: CORONARY STENT INTERVENTION;  Surgeon: Lucendia Rusk, MD;  Location: [facility] INVASIVE CV LAB;  Service: Cardiovascular;  Laterality: N/A;    CORONARY/GRAFT ACUTE MI REVASCULARIZATION N/A 03/20/2021   Procedure: Coronary/Graft Acute MI Revascularization;  Surgeon: Lucendia Rusk, MD;  Location: [facility] INVASIVE CV LAB;  Service: Cardiovascular;  Laterality: N/A;   DILATION AND CURETTAGE OF UTERUS     LEFT HEART CATH AND CORONARY ANGIOGRAPHY N/A 03/20/2021   Procedure: LEFT HEART CATH AND CORONARY ANGIOGRAPHY;  Surgeon: Lucendia Rusk, MD;  Location: [facility] INVASIVE CV LAB;  Service: Cardiovascular;  Laterality: N/A;   REVERSE SHOULDER ARTHROPLASTY Right 07/15/2020   Procedure: REVERSE SHOULDER ARTHROPLASTY;  Surgeon: Sammye Cristal, MD;  Location: [facility] ORS;  Service: Orthopedics;  Laterality: Right;    Current Outpatient Medications  Medication Sig Dispense Refill   Ascorbic Acid (VITAMIN C) 1000 MG tablet Take 1,000 mg by mouth daily.     aspirin  EC 81 MG tablet Take 81 mg by mouth daily. Swallow whole.     b complex vitamins tablet Take 1 tablet by mouth daily.     clopidogrel  (PLAVIX ) 75 MG tablet Take 1 tablet (75 mg total) by mouth daily.     Cod Liver Oil CAPS Take 1 capsule by mouth daily at 6 (six) AM.     estradiol (CLIMARA - DOSED IN MG/24 HR) 0.05 mg/24hr patch 0.05 mg once a week.     ezetimibe  (ZETIA ) 10 MG tablet Take 5 mg by mouth daily.     Magnesium 250 MG TABS Take 250 mg by mouth daily.     metoprolol  tartrate (LOPRESSOR ) 25 MG tablet TAKE 1/2 TABLET BY MOUTH TWICE DAILY 60 tablet 2   nitroGLYCERIN  (NITROSTAT ) 0.4 MG SL tablet Place 1 tablet (0.4 mg total) under the tongue every 5 (five) minutes as needed for chest pain. 25 tablet 2   OVER THE COUNTER MEDICATION Take 600 mg by mouth daily at 6 (six) AM. RED YEAST RICE W/ CO Q 10.     OVER THE COUNTER MEDICATION Take 2,000 Units by mouth daily at 6 (six) AM. NATTOKINASE bloodthinner supplement.     Polyethyl Glycol-Propyl Glycol (SYSTANE) 0.4-0.3 % SOLN Place 1 drop into both eyes 2 (two) times daily.     progesterone  (PROMETRIUM ) 100 MG capsule Take 300 mg by  mouth at bedtime.      thyroid  (ARMOUR) 60 MG tablet 60mg  in the AM and 60mg  in the PM.     zinc gluconate 50 MG tablet Take 50 mg by mouth daily.     No current facility-administered medications for this visit.    Allergies as of 05/23/2024 - Review Complete 04/15/2024  Allergen Reaction Noted   Albuterol   06/19/2013   Lovastatin   11/18/2012   Pravastatin   01/12/2022   Rosuvastatin   01/12/2022   Vytorin [ezetimibe -simvastatin]  01/12/2022    Family History  Problem Relation Age of Onset   Arthritis Mother    Hypertension Mother    Colon polyps Mother    Heart disease Father    Hypertension Father    Arthritis Father    Stroke Father    Heart  failure Brother    Diabetes Brother    Pancreatic cancer Brother    Colon cancer Neg Hx    Breast cancer Neg Hx    Esophageal cancer Neg Hx    Rectal cancer Neg Hx    Stomach cancer Neg Hx     Social History   Tobacco Use   Smoking status: Never   Smokeless tobacco: Never  Vaping Use   Vaping status: Never Used  Substance Use Topics   Alcohol use: No   Drug use: No     Review of Systems:    Constitutional: No weight loss, fever, chills, weakness  Eyes: No change in vision Ears, Nose, Throat:  No change in hearing or congestion Skin: No rash or itching Cardiovascular: No chest pain, chest pressure or palpitations   Respiratory: No SOB or cough Gastrointestinal: See HPI and otherwise negative Genitourinary: No dysuria or change in urinary frequency Neurological: No headache, dizziness or syncope Musculoskeletal: No new muscle or joint pain Hematologic: No bleeding or bruising    Physical Exam:  Vital signs: BP 108/62   Pulse 83   Ht 5\' 3"  (1.6 m)   Wt 160 lb (72.6 kg)   SpO2 98%   BMI 28.34 kg/m    Constitutional: NAD, Well developed, Well nourished, alert and cooperative Head:  Normocephalic and atraumatic.  Eyes: No scleral icterus. Conjunctiva pink. Mouth: No oral lesions. Respiratory: Respirations even  and unlabored. Lungs clear to auscultation bilaterally.  No wheezes, crackles, or rhonchi.  Cardiovascular:  Regular rate and rhythm. No murmurs. No peripheral edema. Gastrointestinal:  Soft, nondistended, mild "soreness" to palpation of LUQ just below ribs. No rebound or guarding. Normal bowel sounds. No appreciable masses or hepatomegaly. Rectal:  Not performed.  Neurologic:  Alert and oriented x4;  grossly normal neurologically.  Skin:   Dry and intact without significant lesions or rashes.   RELEVANT LABS AND IMAGING: CBC    Component Value Date/Time   WBC 6.5 01/28/2024 1114   RBC 4.68 01/28/2024 1114   HGB 14.8 01/28/2024 1114   HGB 14.4 09/07/2021 1424   HCT 44.6 01/28/2024 1114   HCT 42.2 09/07/2021 1424   PLT 270.0 01/28/2024 1114   PLT 268 09/07/2021 1424   MCV 95.3 01/28/2024 1114   MCV 92 09/07/2021 1424   MCH 31.2 09/07/2021 1424   MCH 30.9 04/17/2021 1612   MCHC 33.1 01/28/2024 1114   RDW 14.2 01/28/2024 1114   RDW 13.1 09/07/2021 1424   LYMPHSABS 1.5 01/28/2024 1114   MONOABS 0.4 01/28/2024 1114   EOSABS 0.2 01/28/2024 1114   BASOSABS 0.0 01/28/2024 1114    CMP     Component Value Date/Time   NA 142 01/28/2024 1114   NA 141 09/07/2021 1424   K 4.4 01/28/2024 1114   CL 104 01/28/2024 1114   CO2 25 01/28/2024 1114   GLUCOSE 103 (H) 01/28/2024 1114   BUN 12 01/28/2024 1114   BUN 18 09/07/2021 1424   CREATININE 0.85 01/28/2024 1114   CALCIUM  9.2 01/28/2024 1114   PROT 6.9 01/28/2024 1114   PROT 6.5 03/02/2022 1130   ALBUMIN 4.4 01/28/2024 1114   ALBUMIN 4.3 03/02/2022 1130   AST 15 01/28/2024 1114   ALT 11 01/28/2024 1114   ALKPHOS 79 01/28/2024 1114   BILITOT 0.4 01/28/2024 1114   BILITOT 0.3 03/02/2022 1130   GFRNONAA >60 04/17/2021 1612   GFRAA >60 07/12/2020 1158   Echocardiogram 07/21/2021 1. Left ventricular ejection fraction, by estimation, is 55  to 60% . Left ventricular ejection fraction by 3D volume is 59 % . The left ventricle has normal  function. The left ventricle has no regional wall motion abnormalities. Left ventricular diastolic parameters are consistent with Grade I diastolic dysfunction ( impaired relaxation) . The average left ventricular global longitudinal strain is - 23. 9 % . The global longitudinal strain is normal.  2. Right ventricular systolic function is normal. The right ventricular size is normal.  3. The mitral valve is normal in structure. No evidence of mitral valve regurgitation. No evidence of mitral stenosis.  4. The aortic valve is normal in structure. Aortic valve regurgitation is not visualized. No aortic stenosis is present.  5. The inferior vena cava is normal in size with greater than 50% respiratory variability, suggesting right atrial pressure of 3 mmHg.  Assessment/Plan:   LUQ abdominal pain GERD History of anterior MI 03/2021 Patient with about 1 year of intermittent LUQ pain which feels like pressure, with no clear inciting or alleviating factors.  Can be present first thing in the morning, and tends to last throughout the day.  No clear association with eating.  No source of pain identified on recent CT abdomen/pelvis.  Having very infrequent acid reflux.  Some mild tenderness on exam today which patient describes as soreness.  She has had decreased frequency of pain in the last month.  We discussed trial of antireflux medication.  We also discussed testing for H. pylori.  I am not sure that EGD would be revealing, but we could consider this as well.  Patient would prefer to avoid invasive procedures.  With her cardiac history, and with pain being described as pressure and occurring high in the LUQ just below the ribs, I would like her to seek cardiac evaluation.  She has not seen cardiology in over a year.  Patient and husband prefer to discuss first with cardiology and then reconsider further testing and PPI/H2RA trial.  - Consider H. Pylori stool test - Consider PPI trial vs. Pepcid  -  Possible that this is musculoskeletal pain. - If pain persists and no other cause identified could consider EGD.   Valiant Gaul, PA-C  Gastroenterology 05/23/2024, 8:22 AM  Patient Care Team: Donnie Galea, MD as PCP - General (Family Medicine) Lucendia Rusk, MD as PCP - Cardiology (Cardiology)

## 2024-05-27 NOTE — Progress Notes (Signed)
 Addendum: Reviewed and agree with assessment and management plan. Asha Grumbine, Carie Caddy, MD

## 2024-06-30 ENCOUNTER — Ambulatory Visit (INDEPENDENT_AMBULATORY_CARE_PROVIDER_SITE_OTHER)

## 2024-06-30 ENCOUNTER — Ambulatory Visit: Admitting: Podiatry

## 2024-06-30 VITALS — Ht 63.0 in | Wt 160.0 lb

## 2024-06-30 DIAGNOSIS — L84 Corns and callosities: Secondary | ICD-10-CM

## 2024-06-30 DIAGNOSIS — M21611 Bunion of right foot: Secondary | ICD-10-CM

## 2024-06-30 DIAGNOSIS — L989 Disorder of the skin and subcutaneous tissue, unspecified: Secondary | ICD-10-CM | POA: Diagnosis not present

## 2024-06-30 DIAGNOSIS — Z7901 Long term (current) use of anticoagulants: Secondary | ICD-10-CM

## 2024-06-30 DIAGNOSIS — L98499 Non-pressure chronic ulcer of skin of other sites with unspecified severity: Secondary | ICD-10-CM | POA: Diagnosis not present

## 2024-07-01 NOTE — Progress Notes (Signed)
  Subjective:  Patient ID: Kim Brown, female    DOB: 1951-11-30,  MRN: [id number]  Chief Complaint  Patient presents with   Bunions    Rm 11 Patient is here for right foot bunion pain. Patient is currently wearing insoles for support.    Discussed the use of AI scribe software for clinical note transcription with the patient, who gave verbal consent to proceed.  History of Present Illness Kim Brown is a 73 year old female with a history of right foot surgery and Reflex Sympathetic Dystrophy (RSD) who presents with right foot pain due to a bunion.  She experiences localized pain in the right foot, particularly when there is pressure from shoes. The pain is exacerbated by a protrusion in the bunion area, and her toe sits up, causing discomfort. A callus has developed in this area. She alleviates discomfort by placing a cotton ball between her toes and the shoe to reduce rubbing.  She has not received recent treatment for her foot and does not recall any recent diagnostic studies. She is not using any medication for this condition. There are no current issues related to past RSD.  She is on Plavix       Objective:    Physical Exam General: AAO x3, NAD  Dermatological: On the medial aspect the right foot on the first metatarsal head, bunion is a hyperkeratotic protuberance without any underlying ulceration although there was some dried blood present underneath the callus.  There is no surrounding erythema, drainage or pus or any signs of infection.  Vascular: Dorsalis Pedis artery and Posterior Tibial artery pedal pulses are 2/4 bilateral with immedate capillary fill time.  There is no pain with calf compression, swelling, warmth, erythema.   Neruologic: Grossly intact via light touch bilateral.   Musculoskeletal: Bunions present on the right foot as well as digital contractures.  Tenderness directly along the hyperkeratotic lesion.  Gait: Unassisted, Nonantalgic.     No  images are attached to the encounter.    Results RADIOLOGY Foot X-ray: Residual bunion deformity with hammertoe contractures.  Hardware intact from prior surgery.  There is no evidence of acute fracture.    Assessment:   1. Bunion, right foot   2. Skin lesion   3.      Pre-ulcerative callus 4.      Chronic anticoagulation    Plan:  Patient was evaluated and treated and all questions answered.  Assessment and Plan Assessment & Plan Bunion of right foot Chronic bunion causing toe elevation, pressure, and callus formation. Prefers non-surgical management. -Sharply debrided the hyperkeratotic lesion without any complications or bleeding.  I did send this to pathology to make sure there is no other pathology associated with this. - Provide gel bunion pad to help decrease pressure. - Educate on moisturizer use at night. - Discussed that she can lightly use of a pumice stone to help decrease the callus.   Return if symptoms worsen or fail to improve.   Donnice JONELLE Fees DPM

## 2024-07-14 ENCOUNTER — Ambulatory Visit: Payer: Self-pay | Admitting: Podiatry

## 2024-07-31 NOTE — Progress Notes (Signed)
 Office Visit    Patient Name: Kim Brown Date of Encounter: 08/01/2024  PCP:  Cleatus Arlyss RAMAN, MD   [facility]  Cardiologist:  Candyce Reek, MD  Advanced Practice Provider:  No care team member to display Electrophysiologist:  None   HPI    Kim Brown is a 73 y.o. female patient with history of hyperlipidemia who had an anterior MI 4/22 showed the following:   Mid LAD-1 lesion is 100% stenosed. After thrombectomy, a drug-eluting stent was successfully placed using a SYNERGY XD 3.0X16, postdilated to 3.75 mm. Post intervention, there is a 0% residual stenosis. Mid LAD-2 lesion is 95% stenosed. A drug-eluting stent was successfully placed using a SYNERGY XD 2.25X28, postdilated to 2.5 mm. Post intervention, there is a 0% residual stenosis. Mid Cx lesion is 90% stenosed. A drug-eluting stent was successfully placed using a SYNERGY XD 3.50X16, postdilated to 3.75 mm. Post intervention, there is a 0% residual stenosis. 2nd Diag lesion is 50% stenosed. Dist LAD lesion is 25% stenosed. The mid to distal LAD was a very small vessel in general. There is mild to moderate left ventricular systolic dysfunction. LV end diastolic pressure is moderately elevated. LVEDP 23 mm Hg. The left ventricular ejection fraction is 35-45% by visual estimate. There is no aortic valve stenosis.    the last seen 09/07/22 presents today for follow-up.  Plan was to continue dual antiplatelet therapy for 12 months.  Is she would need aggressive secondary prevention.  She had myalgias with lovastatin .  Plan to start rosuvastatin  20 mg daily.  Would need metoprolol  and an ACE inhibitor depending on EF.  Admitted with dizziness a week after cath.  Given fluid.  Had urinary retention of 1600 cc.  She has not noticed with ischemic cardiomyopathy and her ejection fraction was 35 to 45%, chronic systolic heart failure.  She continues on dual antiplatelet therapy with aspirin  and try  To lower.  Continue beta-blocker with metoprolol  and plan to avoid ACE/ARB due to hypotension. Post MI she had fatigue.  Normal BNP.  Plan to repeat echocardiogram that showed improved LVEF.  She reported daily anxiety and walking daily no exertional chest discomfort.  She was still having palpitations and the monitor was ordered which showed normal sinus rhythm with rare PACs, PVCs.  Rare brief runs of PACs and PVCs which occasionally correlated with symptoms.  No atrial fibrillation.  No sustained arrhythmias.  Also complaining of some back pain.  No problems with regular activities.  She hurt her knee during physical therapy.  She was limited and walking somewhat.  Water  aerobics were discussed.  I saw her 04/2023 she still periodically has issues with palpitations.  Afterwards she typically feels weak and gets nervous.  She also has some fatigue few hours after.  She did have an episode last week.  Per her husband episodes happen about 3-4 times a month.  Reviewed her last monitor which was 2 years ago.  Recent lab work also reviewed and her TSH was low therefore her medication was reduced.  We discussed Leqvio and  Repatha for further cholesterol reduction. LDL above goal at 157.  Discussed changing her metoprolol  to the short acting to further reduce fatigue symptoms with the long-acting.  Reports no shortness of breath nor dyspnea on exertion. Reports no chest pain, pressure, or tightness. No edema, orthopnea, PND.   Today, she presents with coronary artery disease with palpitations and extreme fatigue. Her husband is present during the visit.  She experiences palpitations every couple of months, accompanied by extreme fatigue. There is no chest pain, angina equivalent, or shortness of breath. Her extreme fatigue began after stent placement in 2022 and has persisted. She attempted cardiac rehabilitation but was unable to continue due to an ankle injury. Despite this, she engages in some physical  activity. In February, a lipid panel showed elevated triglycerides at 253 mg/dL and LDL at 807 mg/dL, above target goals.   Reports no shortness of breath nor dyspnea on exertion. Reports no chest pain, pressure, or tightness. No edema, orthopnea, PND. Reports no palpitations.   Discussed the use of AI scribe software for clinical note transcription with the patient, who gave verbal consent to proceed.   Past Medical History    Past Medical History:  Diagnosis Date   Allergy    Anemia    Arthritis    feet and hands , neck, shoulder   Coronary artery disease    Dysrhythmia 2006   atrial flutter   Exogenous obesity    Heel spur    Hyperlipidemia    Hypertension    Hypothyroid    Irregular heart beats    Ischemic cardiomyopathy    Palpitations    Prolapsed, anus    uses mag oxide powder nightly    RSD (reflex sympathetic dystrophy)    after surgery on R foot   STEMI (ST elevation myocardial infarction) (HCC)    Stress incontinence, female    Past Surgical History:  Procedure Laterality Date   ABDOMINAL HYSTERECTOMY  2005   BUNIONECTOMY     COLONOSCOPY     CORONARY ANGIOPLASTY     CORONARY STENT INTERVENTION N/A 03/20/2021   Procedure: CORONARY STENT INTERVENTION;  Surgeon: Dann Candyce RAMAN, MD;  Location: [facility] INVASIVE CV LAB;  Service: Cardiovascular;  Laterality: N/A;   CORONARY/GRAFT ACUTE MI REVASCULARIZATION N/A 03/20/2021   Procedure: Coronary/Graft Acute MI Revascularization;  Surgeon: Dann Candyce RAMAN, MD;  Location: [facility] INVASIVE CV LAB;  Service: Cardiovascular;  Laterality: N/A;   DILATION AND CURETTAGE OF UTERUS     LEFT HEART CATH AND CORONARY ANGIOGRAPHY N/A 03/20/2021   Procedure: LEFT HEART CATH AND CORONARY ANGIOGRAPHY;  Surgeon: Dann Candyce RAMAN, MD;  Location: [facility] INVASIVE CV LAB;  Service: Cardiovascular;  Laterality: N/A;   REVERSE SHOULDER ARTHROPLASTY Right 07/15/2020   Procedure: REVERSE SHOULDER ARTHROPLASTY;  Surgeon: Dozier Soulier, MD;   Location: [facility] ORS;  Service: Orthopedics;  Laterality: Right;    Allergies  Allergies  Allergen Reactions   Albuterol      Jittery sensation   Lovastatin      Myalgias   Pravastatin      Myalgias on 10mg     Rosuvastatin      Lethargy and brain fog, nervousness on 5mg  and 40mg  daily   Vytorin [Ezetimibe -Simvastatin]     Muscle aches    EKGs/Labs/Other Studies Reviewed:   The following studies were reviewed today: Cardiac Studies & Procedures   ______________________________________________________________________________________________ CARDIAC CATHETERIZATION  CARDIAC CATHETERIZATION 03/20/2021  Conclusion  Mid LAD-1 lesion is 100% stenosed. After thrombectomy, a drug-eluting stent was successfully placed using a SYNERGY XD 3.0X16, postdilated to 3.75 mm.  Post intervention, there is a 0% residual stenosis.  Mid LAD-2 lesion is 95% stenosed. A drug-eluting stent was successfully placed using a SYNERGY XD 2.25X28, postdilated to 2.5 mm.  Post intervention, there is a 0% residual stenosis.  Mid Cx lesion is 90% stenosed. A drug-eluting stent was successfully placed using a SYNERGY XD 3.50X16, postdilated to 3.75 mm.  Post intervention, there is a 0% residual stenosis.  2nd Diag lesion is 50% stenosed.  Dist LAD lesion is 25% stenosed. The mid to distal LAD was a very small vessel in general.  There is mild to moderate left ventricular systolic dysfunction.  LV end diastolic pressure is moderately elevated. LVEDP 23 mm Hg.  The left ventricular ejection fraction is 35-45% by visual estimate.  There is no aortic valve stenosis.  Continue dual antiplatelet therapy for 12 months.  She will need aggressive secondary prevention.  She had myalgias with lovastatin .  Start rosuvastatin  20 mg daily.  She will need metoprolol  and likely an ACE inhibitor depending on her ejection fraction.  We will plan for echocardiogram on Monday.  No IV fluids due to elevated LVEDP in the setting  of anterior wall MI and decreased LVEF.  I discussed the findings with her husband.  Findings Coronary Findings Diagnostic  Dominance: Left  Left Anterior Descending Mid LAD-1 lesion is 100% stenosed. Vessel is the culprit lesion. Mid LAD-2 lesion is 95% stenosed. Dist LAD lesion is 25% stenosed.  Second Diagonal Branch 2nd Diag lesion is 50% stenosed.  Left Circumflex Mid Cx lesion is 90% stenosed. The lesion is eccentric and irregular.  Intervention  Mid LAD-1 lesion Thrombectomy CATH LAUNCHER 59FR EBU3.5 guide catheter was inserted. WIRE ASAHI PROWATER 180CM guidewire was used to cross lesion. Aspiration thrombectomy performed using a CATH EXTRAC PRONTO LP 59F RND. 1 passes taken. Stent CATH LAUNCHER 59FR EBU3.5 guide catheter was inserted. Lesion crossed with guidewire using a WIRE ASAHI PROWATER 180CM. Pre-stent angioplasty was performed using a BALLOON SAPPHIRE 2.0X15. A drug-eluting stent was successfully placed using a SYNERGY XD 3.0X16. Stent strut is well apposed. Post-stent angioplasty was performed using a BALLOON [place] EMERGE MR 3.75X8. After seeing flow through the more proximal lesion, we could see the more distal lesion. Post-Intervention Lesion Assessment The intervention was successful. Pre-interventional TIMI flow is 0. Post-intervention TIMI flow is 3. No complications occurred at this lesion. There is a 0% residual stenosis post intervention.  Mid LAD-2 lesion Stent CATH LAUNCHER 59FR EBU3.5 guide catheter was inserted. Lesion crossed with guidewire using a WIRE ASAHI PROWATER 180CM. Pre-stent angioplasty was performed using a BALLOON SAPPHIRE 2.0X15. A drug-eluting stent was successfully placed using a SYNERGY XD 2.25X28. Stent strut is well apposed. Post-stent angioplasty was performed using a BALLOON SAPPHIRE [place] 2.5X12. IC NTG given Post-Intervention Lesion Assessment The intervention was successful. Pre-interventional TIMI flow is 1. Post-intervention TIMI flow is  3. No complications occurred at this lesion. There is a 0% residual stenosis post intervention.  Mid Cx lesion Stent CATH LAUNCHER 59FR EBU3.5 guide catheter was inserted. Lesion crossed with guidewire using a WIRE HI TORQ [place] 190CM. Pre-stent angioplasty was performed using a BALLOON SAPPHIRE 3.0X15. A drug-eluting stent was successfully placed using a SYNERGY XD 3.50X16. Stent strut is well apposed. Post-stent angioplasty was performed using a BALLOON [place] EMERGE MR 3.75X8. Post-Intervention Lesion Assessment The intervention was successful. Pre-interventional TIMI flow is 3. Post-intervention TIMI flow is 3. No complications occurred at this lesion. There is a 0% residual stenosis post intervention.     ECHOCARDIOGRAM  ECHOCARDIOGRAM COMPLETE 07/21/2021  Narrative ECHOCARDIOGRAM REPORT    Patient Name:   Kim Brown Date of Exam: 07/21/2021 Medical Rec #:  [id number]     Height:       64.3 in Accession #:    [id number]    Weight:       157.4 lb Date of Birth:  Dec 17, 1951      BSA:          1.772 m Patient Age:    69 years      BP:           119/77 mmHg Patient Gender: F             HR:           73 bpm. Exam Location:  [address]  Procedure: 2D Echo, Cardiac Doppler, Color Doppler, 3D Echo and Strain Analysis  Indications:    I25.10 Coronary artery disease.  History:        Patient has prior history of Echocardiogram examinations, most recent 03/20/2021. CAD, STENTS x 3.; Risk Factors:Hypertension and Dyslipidemia. Myocardial infarction.  Sonographer:    Carl Rodgers-Jones RDCS Referring Phys: [id number] JAYADEEP S VARANASI  IMPRESSIONS   1. Left ventricular ejection fraction, by estimation, is 55 to 60%. Left ventricular ejection fraction by 3D volume is 59 %. The left ventricle has normal function. The left ventricle has no regional wall motion abnormalities. Left ventricular diastolic parameters are consistent with Grade I diastolic dysfunction (impaired relaxation). The  average left ventricular global longitudinal strain is -23.9 %. The global longitudinal strain is normal. 2. Right ventricular systolic function is normal. The right ventricular size is normal. 3. The mitral valve is normal in structure. No evidence of mitral valve regurgitation. No evidence of mitral stenosis. 4. The aortic valve is normal in structure. Aortic valve regurgitation is not visualized. No aortic stenosis is present. 5. The inferior vena cava is normal in size with greater than 50% respiratory variability, suggesting right atrial pressure of 3 mmHg.  Comparison(s): Prior images reviewed side by side. The left ventricular function has improved.  FINDINGS Left Ventricle: Left ventricular ejection fraction, by estimation, is 55 to 60%. Left ventricular ejection fraction by 3D volume is 59 %. The left ventricle has normal function. The left ventricle has no regional wall motion abnormalities. The average left ventricular global longitudinal strain is -23.9 %. The global longitudinal strain is normal. The left ventricular internal cavity size was normal in size. There is no left ventricular hypertrophy. Left ventricular diastolic parameters are consistent with Grade I diastolic dysfunction (impaired relaxation). Normal left ventricular filling pressure.  Right Ventricle: The right ventricular size is normal. No increase in right ventricular wall thickness. Right ventricular systolic function is normal.  Left Atrium: Left atrial size was normal in size.  Right Atrium: Right atrial size was normal in size.  Pericardium: There is no evidence of pericardial effusion.  Mitral Valve: The mitral valve is normal in structure. No evidence of mitral valve regurgitation. No evidence of mitral valve stenosis.  Tricuspid Valve: The tricuspid valve is normal in structure. Tricuspid valve regurgitation is not demonstrated. No evidence of tricuspid stenosis.  Aortic Valve: The aortic valve is normal  in structure. Aortic valve regurgitation is not visualized. No aortic stenosis is present.  Pulmonic Valve: The pulmonic valve was normal in structure. Pulmonic valve regurgitation is not visualized. No evidence of pulmonic stenosis.  Aorta: The aortic root is normal in size and structure.  Venous: The inferior vena cava is normal in size with greater than 50% respiratory variability, suggesting right atrial pressure of 3 mmHg.  IAS/Shunts: No atrial level shunt detected by color flow Doppler.   LEFT VENTRICLE PLAX 2D LVIDd:         4.50 cm         Diastology LVIDs:  2.60 cm         LV e' medial:    7.18 cm/s LV PW:         0.70 cm         LV E/e' medial:  10.2 LV IVS:        0.70 cm         LV e' lateral:   7.94 cm/s LVOT diam:     1.80 cm         LV E/e' lateral: 9.2 LV SV:         62 LV SV Index:   35              2D LVOT Area:     2.54 cm        Longitudinal Strain 2D Strain GLS  -24.1 % (A2C): 2D Strain GLS  -24.4 % (A3C): 2D Strain GLS  -23.2 % (A4C): 2D Strain GLS  -23.9 % Avg:  3D Volume EF LV 3D EF:    Left ventricul ar ejection fraction by 3D volume is 59 %.  3D Volume EF: 3D EF:        59 % LV EDV:       91 ml LV ESV:       37 ml LV SV:        54 ml  RIGHT VENTRICLE             IVC RV Basal diam:  2.60 cm     IVC diam: 1.20 cm RV S prime:     16.70 cm/s TAPSE (M-mode): 2.9 cm  LEFT ATRIUM             Index       RIGHT ATRIUM           Index LA diam:        3.30 cm 1.86 cm/m  RA Area:     11.40 cm LA Vol (A2C):   21.3 ml 12.02 ml/m RA Volume:   27.80 ml  15.69 ml/m LA Vol (A4C):   17.3 ml 9.76 ml/m LA Biplane Vol: 19.2 ml 10.84 ml/m AORTIC VALVE LVOT Vmax:   122.33 cm/s LVOT Vmean:  80.000 cm/s LVOT VTI:    0.242 m  AORTA Ao Root diam: 3.20 cm Ao Asc diam:  3.50 cm  MITRAL VALVE                TRICUSPID VALVE MV Area (PHT): 2.56 cm     TR Peak grad:   19.0 mmHg MV Decel Time: 296 msec     TR Vmax:        218.00 cm/s MV E  velocity: 73.30 cm/s MV A velocity: 106.00 cm/s  SHUNTS MV E/A ratio:  0.69         Systemic VTI:  0.24 m Systemic Diam: 1.80 cm  Jerel Croitoru MD Electronically signed by Jerel Balding MD Signature Date/Time: 07/22/2021/7:10:32 AM    Final    MONITORS  LONG TERM MONITOR (3-14 DAYS) 05/12/2021  Narrative  Normal sinus rhythm with rare PACs, PVCs.  Rare brief runs of PACs and PVCs which occasionally correlate to symptoms.  No atrial fibrillation.  No sustained arrhythmias.   Patch Wear Time:  14 days and 0 hours (2022-05-06T12:31:11-399 to 2022-05-20T12:31:16-0400)  Patient had a min HR of 55 bpm, max HR of 187 bpm, and avg HR of 77 bpm. Predominant underlying rhythm was Sinus Rhythm. 2 Ventricular Tachycardia runs occurred, the run with the fastest  interval lasting 5 beats with a max rate of 150 bpm, the longest lasting 11 beats with an avg rate of 111 bpm. 13 Supraventricular Tachycardia runs occurred, the run with the fastest interval lasting 4 beats with a max rate of 187 bpm, the longest lasting 20 beats with an avg rate of 112 bpm. Supraventricular Tachycardia was detected within +/- 45 seconds of symptomatic patient event(s). Isolated SVEs were rare (<1.0%), SVE Couplets were rare (<1.0%), and SVE Triplets were rare (<1.0%). Isolated VEs were rare (<1.0%, 3633), VE Couplets were rare (<1.0%, 28), and VE Triplets were rare (<1.0%, 6).       ______________________________________________________________________________________________       EKG:  EKG is  ordered today.  The ekg ordered today demonstrates NSR, rate 74  Recent Labs: 01/28/2024: ALT 11; BUN 12; Creatinine, Ser 0.85; Hemoglobin 14.8; Platelets 270.0; Potassium 4.4; Sodium 142; TSH 5.85  Recent Lipid Panel    Component Value Date/Time   CHOL 300 (H) 01/28/2024 1114   CHOL 138 03/02/2022 1130   TRIG 253.0 (H) 01/28/2024 1114   HDL 56.80 01/28/2024 1114   HDL 42 03/02/2022 1130   CHOLHDL 5 01/28/2024  1114   VLDL 50.6 (H) 01/28/2024 1114   LDLCALC 192 (H) 01/28/2024 1114   LDLCALC 71 03/02/2022 1130   LDLDIRECT 184.0 09/05/2019 1120    Home Medications   Current Meds  Medication Sig   Ascorbic Acid (VITAMIN C) 1000 MG tablet Take 1,000 mg by mouth daily.   aspirin  EC 81 MG tablet Take 81 mg by mouth daily. Swallow whole.   b complex vitamins tablet Take 1 tablet by mouth daily.   clopidogrel  (PLAVIX ) 75 MG tablet Take 1 tablet (75 mg total) by mouth daily.   Cod Liver Oil CAPS Take 1 capsule by mouth daily at 6 (six) AM.   estradiol (CLIMARA - DOSED IN MG/24 HR) 0.05 mg/24hr patch 0.05 mg once a week.   ezetimibe  (ZETIA ) 10 MG tablet Take 5 mg by mouth daily.   Magnesium 250 MG TABS Take 250 mg by mouth daily.   metoprolol  tartrate (LOPRESSOR ) 25 MG tablet TAKE 1/2 TABLET BY MOUTH TWICE DAILY   OVER THE COUNTER MEDICATION Take 600 mg by mouth daily at 6 (six) AM. RED YEAST RICE W/ CO Q 10.   OVER THE COUNTER MEDICATION Take 2,000 Units by mouth daily at 6 (six) AM. NATTOKINASE bloodthinner supplement.   Polyethyl Glycol-Propyl Glycol (SYSTANE) 0.4-0.3 % SOLN Place 1 drop into both eyes 2 (two) times daily.   progesterone  (PROMETRIUM ) 100 MG capsule Take 300 mg by mouth at bedtime.    thyroid  (ARMOUR) 60 MG tablet 60mg  in the AM and 60mg  in the PM.   zinc gluconate 50 MG tablet Take 50 mg by mouth daily.   [DISCONTINUED] nitroGLYCERIN  (NITROSTAT ) 0.4 MG SL tablet Place 1 tablet (0.4 mg total) under the tongue every 5 (five) minutes as needed for chest pain.     Review of Systems      All other systems reviewed and are otherwise negative except as noted above.  Physical Exam    VS:  BP 139/78   Pulse 69   Ht 5' 3.5 (1.613 m)   Wt 162 lb (73.5 kg)   SpO2 99%   BMI 28.25 kg/m  , BMI Body mass index is 28.25 kg/m.  Wt Readings from Last 3 Encounters:  08/01/24 162 lb (73.5 kg)  06/30/24 160 lb (72.6 kg)  05/23/24 160 lb (72.6 kg)  GEN: Well nourished, well  developed, in no acute distress. HEENT: normal. Neck: Supple, no JVD, carotid bruits, or masses. Cardiac: RRR, no murmurs, rubs, or gallops. No clubbing, cyanosis, edema.  Radials/PT 2+ and equal bilaterally.  Respiratory:  Respirations regular and unlabored, clear to auscultation bilaterally. GI: Soft, nontender, nondistended. MS: No deformity or atrophy. Skin: Warm and dry, no rash. Neuro:  Strength and sensation are intact. Psych: Normal affect.  Assessment & Plan     Fatigue following coronary stent placement Persistent extreme fatigue since stent placement in 2022. No associated chest pain, angina equivalent, or shortness of breath. -order an echo -increase metoprolol  for better control of palpitations  Palpitations Palpitations occur every couple of months, associated with extreme fatigue. No chest pain or shortness of breath. - Increase metoprolol  to 25 mg twice a day.  Hyperlipidemia Hyperlipidemia with triglycerides at 253 mg/dL and LDL at 807 mg/dL, above goal. She is not interested in injectable options like Repatha or Praluent. Alternative oral medication, Nexlizet, discussed. - Discuss with PharmD to obtain prior authorization for Nexlizet.  Hypertension -BP is well controlled today -continue current medications       Disposition: Follow up 1 year with Candyce Reek, MD or APP.  Signed, Orren LOISE Fabry, PA-C 08/01/2024, 4:26 PM [facility]

## 2024-08-01 ENCOUNTER — Ambulatory Visit: Attending: Physician Assistant | Admitting: Physician Assistant

## 2024-08-01 VITALS — BP 139/78 | HR 69 | Ht 63.5 in | Wt 162.0 lb

## 2024-08-01 DIAGNOSIS — R002 Palpitations: Secondary | ICD-10-CM

## 2024-08-01 DIAGNOSIS — R42 Dizziness and giddiness: Secondary | ICD-10-CM

## 2024-08-01 DIAGNOSIS — I2510 Atherosclerotic heart disease of native coronary artery without angina pectoris: Secondary | ICD-10-CM

## 2024-08-01 DIAGNOSIS — I10 Essential (primary) hypertension: Secondary | ICD-10-CM | POA: Diagnosis not present

## 2024-08-01 DIAGNOSIS — T466X5S Adverse effect of antihyperlipidemic and antiarteriosclerotic drugs, sequela: Secondary | ICD-10-CM | POA: Diagnosis not present

## 2024-08-01 DIAGNOSIS — R413 Other amnesia: Secondary | ICD-10-CM | POA: Diagnosis not present

## 2024-08-01 DIAGNOSIS — E785 Hyperlipidemia, unspecified: Secondary | ICD-10-CM

## 2024-08-01 DIAGNOSIS — G720 Drug-induced myopathy: Secondary | ICD-10-CM | POA: Diagnosis not present

## 2024-08-01 DIAGNOSIS — R5383 Other fatigue: Secondary | ICD-10-CM

## 2024-08-01 MED ORDER — METOPROLOL TARTRATE 25 MG PO TABS: 25.0000 mg | ORAL_TABLET | Freq: Two times a day (BID) | ORAL | 3 refills | Status: AC

## 2024-08-01 MED ORDER — NITROGLYCERIN 0.4 MG SL SUBL: 0.4000 mg | SUBLINGUAL_TABLET | SUBLINGUAL | 2 refills | Status: AC | PRN

## 2024-08-01 NOTE — Addendum Note (Signed)
 Addended by: Danyela Posas on: 08/01/2024 04:57 PM   Modules accepted: Orders

## 2024-08-01 NOTE — Patient Instructions (Addendum)
 Medication Instructions:  Increase Metoprolol  25 mg twice daily  *If you need a refill on your cardiac medications before your next appointment, please call your pharmacy*  Lab Work: NONE ordered at this time of appointment   Testing/Procedures: Your physician has requested that you have an echocardiogram. Echocardiography is a painless test that uses sound waves to create images of your heart. It provides your doctor with information about the size and shape of your heart and how well your heart's chambers and valves are working. This procedure takes approximately one hour. There are no restrictions for this procedure. Please do NOT wear cologne, perfume, aftershave, or lotions (deodorant is allowed). Please arrive 15 minutes prior to your appointment time.  Please note: We ask at that you not bring children with you during ultrasound (echo/ vascular) testing. Due to room size and safety concerns, children are not allowed in the ultrasound rooms during exams. Our front office staff cannot provide observation of children in our lobby area while testing is being conducted. An adult accompanying a patient to their appointment will only be allowed in the ultrasound room at the discretion of the ultrasound technician under special circumstances. We apologize for any inconvenience.   Follow-Up: At [facility], you and your health needs are our priority.  As part of our continuing mission to provide you with exceptional heart care, our providers are all part of one team.  This team includes your primary Cardiologist (physician) and Advanced Practice Providers or APPs (Physician Assistants and Nurse Practitioners) who all work together to provide you with the care you need, when you need it.  Your next appointment:   6 month(s)  Provider:   Dr. Georganna Archer     We recommend signing up for the patient portal called MyChart.  Sign up information is provided on this After Visit Summary.   MyChart is used to connect with patients for Virtual Visits (Telemedicine).  Patients are able to view lab/test results, encounter notes, upcoming appointments, etc.  Non-urgent messages can be sent to your provider as well.   To learn more about what you can do with MyChart, go to [email or web address].

## 2024-08-04 ENCOUNTER — Other Ambulatory Visit (HOSPITAL_COMMUNITY): Payer: Self-pay

## 2024-08-04 ENCOUNTER — Telehealth: Payer: Self-pay

## 2024-08-04 DIAGNOSIS — E782 Mixed hyperlipidemia: Secondary | ICD-10-CM

## 2024-08-04 DIAGNOSIS — G720 Drug-induced myopathy: Secondary | ICD-10-CM

## 2024-08-04 NOTE — Telephone Encounter (Signed)
-----   Message from Orren LOISE Fabry sent at 08/01/2024  4:26 PM EDT ----- She is interested in starting Nexlizet, can we work on a PA for her? Thanks!

## 2024-08-05 NOTE — Telephone Encounter (Signed)
 Plan faxed requesting additional info (see chart media)

## 2024-08-06 NOTE — Telephone Encounter (Signed)
    Per test claim: PA required; PA started via CoverMyMeds. KEY [id number] . Please see clinical question(s) below that I am not finding the answer to in their chart and advise.  LDL WITHIN LAST 120 DAYS, PLEASE ADVISE

## 2024-08-13 ENCOUNTER — Ambulatory Visit: Payer: PPO | Admitting: Psychology

## 2024-08-15 NOTE — Telephone Encounter (Addendum)
 Spoke with patient and her husband (DPR on file) and made them aware that she will need to get labs work done at her earliest [facility] for [employer]. Patient verbalized understanding and thanked me for the call. An order has been placed for fasting lipids

## 2024-08-15 NOTE — Addendum Note (Signed)
 Addended by: LENNIE DEFOREST on: 08/15/2024 11:30 AM   Modules accepted: Orders

## 2024-08-20 ENCOUNTER — Other Ambulatory Visit: Payer: Self-pay

## 2024-08-20 DIAGNOSIS — E782 Mixed hyperlipidemia: Secondary | ICD-10-CM

## 2024-08-20 DIAGNOSIS — G720 Drug-induced myopathy: Secondary | ICD-10-CM | POA: Diagnosis not present

## 2024-08-20 DIAGNOSIS — T466X5A Adverse effect of antihyperlipidemic and antiarteriosclerotic drugs, initial encounter: Secondary | ICD-10-CM | POA: Diagnosis not present

## 2024-08-21 LAB — LIPID PANEL
Chol/HDL Ratio: 6.5 ratio — ABNORMAL HIGH (ref 0.0–4.4)
Cholesterol, Total: 274 mg/dL — ABNORMAL HIGH (ref 100–199)
HDL: 42 mg/dL (ref >39)
LDL Chol Calc (NIH): 190 mg/dL — ABNORMAL HIGH (ref 0–99)
Triglycerides: 219 mg/dL — ABNORMAL HIGH (ref 0–149)
VLDL Cholesterol Cal: 42 mg/dL — ABNORMAL HIGH (ref 5–40)

## 2024-08-22 ENCOUNTER — Ambulatory Visit: Payer: Self-pay | Admitting: Physician Assistant

## 2024-08-22 DIAGNOSIS — I2510 Atherosclerotic heart disease of native coronary artery without angina pectoris: Secondary | ICD-10-CM

## 2024-08-22 DIAGNOSIS — E782 Mixed hyperlipidemia: Secondary | ICD-10-CM

## 2024-08-22 DIAGNOSIS — E785 Hyperlipidemia, unspecified: Secondary | ICD-10-CM

## 2024-08-26 ENCOUNTER — Ambulatory Visit: Payer: PPO | Admitting: Psychology

## 2024-09-09 ENCOUNTER — Ambulatory Visit (HOSPITAL_COMMUNITY)
Admission: RE | Admit: 2024-09-09 | Discharge: 2024-09-09 | Disposition: A | Discharge status: Home / Self Care | Source: Ambulatory Visit | Attending: Internal Medicine | Admitting: Internal Medicine

## 2024-09-09 DIAGNOSIS — E785 Hyperlipidemia, unspecified: Secondary | ICD-10-CM | POA: Diagnosis not present

## 2024-09-09 DIAGNOSIS — R002 Palpitations: Secondary | ICD-10-CM | POA: Insufficient documentation

## 2024-09-09 DIAGNOSIS — I252 Old myocardial infarction: Secondary | ICD-10-CM | POA: Insufficient documentation

## 2024-09-09 DIAGNOSIS — I2510 Atherosclerotic heart disease of native coronary artery without angina pectoris: Secondary | ICD-10-CM | POA: Diagnosis not present

## 2024-09-09 DIAGNOSIS — I10 Essential (primary) hypertension: Secondary | ICD-10-CM | POA: Diagnosis not present

## 2024-09-09 DIAGNOSIS — I255 Ischemic cardiomyopathy: Secondary | ICD-10-CM | POA: Diagnosis not present

## 2024-09-09 DIAGNOSIS — R5383 Other fatigue: Secondary | ICD-10-CM | POA: Diagnosis not present

## 2024-09-09 LAB — ECHOCARDIOGRAM COMPLETE
Area-P 1/2: 3.02 cm2
S' Lateral: 2.5 cm

## 2024-09-12 ENCOUNTER — Ambulatory Visit: Payer: Self-pay | Admitting: Physician Assistant

## 2024-09-12 NOTE — Progress Notes (Signed)
 Patient never viewed in mychart. Printed, highlighted doctor comments and mailed to patient

## 2024-09-23 ENCOUNTER — Emergency Department (HOSPITAL_COMMUNITY)

## 2024-09-23 ENCOUNTER — Other Ambulatory Visit: Payer: Self-pay

## 2024-09-23 ENCOUNTER — Emergency Department (HOSPITAL_COMMUNITY)
Admission: EM | Admit: 2024-09-23 | Discharge: 2024-09-23 | Disposition: A | Discharge status: Home / Self Care | Attending: Emergency Medicine | Admitting: Emergency Medicine

## 2024-09-23 ENCOUNTER — Encounter (HOSPITAL_COMMUNITY): Payer: Self-pay

## 2024-09-23 DIAGNOSIS — Z79899 Other long term (current) drug therapy: Secondary | ICD-10-CM | POA: Diagnosis not present

## 2024-09-23 DIAGNOSIS — R531 Weakness: Secondary | ICD-10-CM | POA: Diagnosis not present

## 2024-09-23 DIAGNOSIS — R29818 Other symptoms and signs involving the nervous system: Secondary | ICD-10-CM | POA: Diagnosis not present

## 2024-09-23 DIAGNOSIS — Z96611 Presence of right artificial shoulder joint: Secondary | ICD-10-CM | POA: Diagnosis not present

## 2024-09-23 DIAGNOSIS — Z7901 Long term (current) use of anticoagulants: Secondary | ICD-10-CM | POA: Diagnosis not present

## 2024-09-23 DIAGNOSIS — R55 Syncope and collapse: Secondary | ICD-10-CM | POA: Diagnosis not present

## 2024-09-23 DIAGNOSIS — Z7982 Long term (current) use of aspirin: Secondary | ICD-10-CM | POA: Diagnosis not present

## 2024-09-23 DIAGNOSIS — R4182 Altered mental status, unspecified: Secondary | ICD-10-CM | POA: Diagnosis not present

## 2024-09-23 DIAGNOSIS — R410 Disorientation, unspecified: Secondary | ICD-10-CM | POA: Diagnosis not present

## 2024-09-23 DIAGNOSIS — I10 Essential (primary) hypertension: Secondary | ICD-10-CM | POA: Insufficient documentation

## 2024-09-23 DIAGNOSIS — I2510 Atherosclerotic heart disease of native coronary artery without angina pectoris: Secondary | ICD-10-CM | POA: Insufficient documentation

## 2024-09-23 DIAGNOSIS — R42 Dizziness and giddiness: Secondary | ICD-10-CM | POA: Diagnosis not present

## 2024-09-23 LAB — COMPREHENSIVE METABOLIC PANEL WITH GFR
ALT: 10 U/L (ref 0–44)
AST: 20 U/L (ref 15–41)
Albumin: 3.7 g/dL (ref 3.5–5.0)
Alkaline Phosphatase: 67 U/L (ref 38–126)
Anion gap: 12 (ref 5–15)
BUN: 19 mg/dL (ref 8–23)
CO2: 24 mmol/L (ref 22–32)
Calcium: 9.2 mg/dL (ref 8.9–10.3)
Chloride: 104 mmol/L (ref 98–111)
Creatinine, Ser: 1.06 mg/dL — ABNORMAL HIGH (ref 0.44–1.00)
GFR, Estimated: 55 mL/min — ABNORMAL LOW (ref >60)
Glucose, Bld: 99 mg/dL (ref 70–99)
Potassium: 4.1 mmol/L (ref 3.5–5.1)
Sodium: 140 mmol/L (ref 135–145)
Total Bilirubin: 0.5 mg/dL (ref 0.0–1.2)
Total Protein: 6.7 g/dL (ref 6.5–8.1)

## 2024-09-23 LAB — CBC
HCT: 43.7 % (ref 36.0–46.0)
Hemoglobin: 14.1 g/dL (ref 12.0–15.0)
MCH: 31.1 pg (ref 26.0–34.0)
MCHC: 32.3 g/dL (ref 30.0–36.0)
MCV: 96.5 fL (ref 80.0–100.0)
Platelets: 241 K/uL (ref 150–400)
RBC: 4.53 MIL/uL (ref 3.87–5.11)
RDW: 13.2 % (ref 11.5–15.5)
WBC: 8.6 K/uL (ref 4.0–10.5)
nRBC: 0 % (ref 0.0–0.2)

## 2024-09-23 LAB — URINALYSIS, ROUTINE W REFLEX MICROSCOPIC
Bilirubin Urine: NEGATIVE
Glucose, UA: NEGATIVE mg/dL
Hgb urine dipstick: NEGATIVE
Ketones, ur: NEGATIVE mg/dL
Leukocytes,Ua: NEGATIVE
Nitrite: NEGATIVE
Protein, ur: NEGATIVE mg/dL
Specific Gravity, Urine: 1.004 — ABNORMAL LOW (ref 1.005–1.030)
pH: 7 (ref 5.0–8.0)

## 2024-09-23 LAB — TROPONIN I (HIGH SENSITIVITY): Troponin I (High Sensitivity): 4 ng/L (ref <18)

## 2024-09-23 LAB — CBG MONITORING, ED: Glucose-Capillary: 77 mg/dL (ref 70–99)

## 2024-09-23 NOTE — ED Triage Notes (Addendum)
 Pt presents via EMS c/o near syncopal episode at home. EMS reports pt was feeling weak so she took a nap but when she woke up had a near syncopal episode walking from couch. CBG 116 per EMS. Husband reports BP in 70s on machine at their house, EMS reports manual BP systolic 114.   A&O x4.   Triage BP 128/72

## 2024-09-23 NOTE — ED Provider Notes (Signed)
 [place] EMERGENCY DEPARTMENT AT [facility] Provider Note   CSN: [id number] Arrival date & time: 09/23/24  0134     Patient presents with: Weakness   Kim Brown is a 73 y.o. female.   The history is provided by the patient and medical records.  Weakness  73 y.o. F with hx of HTN, thyroid  disease, arthritis, anemia, CAD, cardiomyopathy, presenting to the ED after near syncopal event.  She reports feeling generally weak throughout the day yesterday, took a nap on the couch and upon waking when trying to get up she had a near syncopal event.  There was no reported fall or head trauma.  Husband tried to take her blood pressure on their home machine and was in the 70s, however EMS reported manual BP with systolic of 114.  Patient was apparently awake and alert on arrival, however by time of my evaluation she is seeming confused, found to be pushing wheelchair up and down the hall stated she is almost home.  She is able to tell me her name and where she is but states year is 2022.  States currently she is feeling fine.  Prior to Admission medications   Medication Sig Start Date End Date Taking? Authorizing Provider  Ascorbic Acid (VITAMIN C) 1000 MG tablet Take 1,000 mg by mouth daily.    [provider]  aspirin  EC 81 MG tablet Take 81 mg by mouth daily. Swallow whole.    [provider]  b complex vitamins tablet Take 1 tablet by mouth daily.    [provider]  clopidogrel  (PLAVIX ) 75 MG tablet Take 1 tablet (75 mg total) by mouth daily. 06/26/22   Cleatus Arlyss RAMAN, MD  [facility] Liver Oil CAPS Take 1 capsule by mouth daily at 6 (six) AM.    [provider]  estradiol (CLIMARA - DOSED IN MG/24 HR) 0.05 mg/24hr patch 0.05 mg once a week. 07/10/22   [provider]  ezetimibe  (ZETIA ) 10 MG tablet Take 5 mg by mouth daily.    [provider]  Magnesium 250 MG TABS Take 250 mg by mouth daily.    [provider]   metoprolol  tartrate (LOPRESSOR ) 25 MG tablet Take 1 tablet (25 mg total) by mouth 2 (two) times daily. 08/01/24   Lucien Orren SAILOR, PA-C  nitroGLYCERIN  (NITROSTAT ) 0.4 MG SL tablet Place 1 tablet (0.4 mg total) under the tongue every 5 (five) minutes as needed for chest pain. 08/01/24 08/01/25  Lucien Orren SAILOR, PA-C  OVER THE COUNTER MEDICATION Take 600 mg by mouth daily at 6 (six) AM. RED YEAST RICE W/ CO Q 10.    [provider]  OVER THE COUNTER MEDICATION Take 2,000 Units by mouth daily at 6 (six) AM. NATTOKINASE bloodthinner supplement.    [provider]  Polyethyl Glycol-Propyl Glycol (SYSTANE) 0.4-0.3 % SOLN Place 1 drop into both eyes 2 (two) times daily.    [provider]  progesterone  (PROMETRIUM ) 100 MG capsule Take 300 mg by mouth at bedtime.  03/26/20   [provider]  thyroid  (ARMOUR) 60 MG tablet 60mg  in the AM and 60mg  in the PM. 01/24/23   Cleatus Arlyss RAMAN, MD  zinc gluconate 50 MG tablet Take 50 mg by mouth daily.    [provider]    Allergies: Albuterol , Lovastatin , Pravastatin , Rosuvastatin , and Vytorin [ezetimibe -simvastatin]    Review of Systems  Neurological:  Positive for weakness.  All other systems reviewed and are negative.   Updated  Vital Signs BP 128/72 (BP Location: Left Arm)   Pulse (!) 57   Temp 98.3 F (36.8 C) (Oral)   Resp 16   SpO2 100%   Physical Exam Vitals and nursing note reviewed.  Constitutional:      Appearance: She is well-developed.  HENT:     Head: Normocephalic and atraumatic.     Comments: No visible head trauma Eyes:     Conjunctiva/sclera: Conjunctivae normal.     Pupils: Pupils are equal, round, and reactive to light.  Cardiovascular:     Rate and Rhythm: Normal rate and regular rhythm.     Heart sounds: Normal heart sounds.  Pulmonary:     Effort: Pulmonary effort is normal.     Breath sounds: Normal breath sounds.  Abdominal:     General: Bowel sounds are normal.     Palpations:  Abdomen is soft.     Tenderness: There is no right CVA tenderness.     Hernia: No hernia is present.  Musculoskeletal:        General: Normal range of motion.     Cervical back: Normal range of motion.  Skin:    General: Skin is warm and dry.  Neurological:     Mental Status: She is alert.     Comments: Awake, alert, able to states name and that she is at hospital but states year is 2022, seems a bit confused, no focal deficits     (all labs ordered are listed, but only abnormal results are displayed) Labs Reviewed  COMPREHENSIVE METABOLIC PANEL WITH GFR - Abnormal; Notable for the following components:      Result Value   Creatinine, Ser 1.06 (*)    GFR, Estimated 55 (*)    All other components within normal limits  URINALYSIS, ROUTINE W REFLEX MICROSCOPIC - Abnormal; Notable for the following components:   Color, Urine STRAW (*)    Specific Gravity, Urine 1.004 (*)    All other components within normal limits  CBC  CBG MONITORING, ED  TROPONIN I (HIGH SENSITIVITY)    EKG: None  Radiology: MR BRAIN WO CONTRAST Result Date: 09/23/2024 EXAM: MRI BRAIN WITHOUT CONTRAST 09/23/2024 06:13:48 AM TECHNIQUE: Multiplanar multisequence MRI of the head/brain was performed without the administration of intravenous contrast. COMPARISON: Head CT 0308 hours today and brain MRI 05/14/2024. CLINICAL HISTORY: 73 year old female with acute neuro deficit, stroke suspected. FINDINGS: BRAIN AND VENTRICLES: No acute infarct. No intracranial hemorrhage. No mass. No midline shift. No hydrocephalus. Stable cerebral volume from the previous MRI. Stable mild age-related cerebral white matter T2 and FLAIR hyperintensity, mostly periventricular and occasionally subcortical (left operculum). No cortical encephalomalacia. No chronic cerebral blood products on SWI. The sella is unremarkable. Normal flow voids. ORBITS: No acute abnormality. SINUSES AND MASTOIDS: Trace paranasal sinus mucosal thickening is stable.  BONES AND SOFT TISSUES: Normal marrow signal. No acute soft tissue abnormality. IMPRESSION: 1. No acute intracranial abnormality. 2. Stable mild for age white matter signal changes. Electronically signed by: Helayne Hurst MD 09/23/2024 06:20 AM EDT RP Workstation: [id number]   DG Chest 2 View Result Date: 09/23/2024 CLINICAL DATA:  Altered mental status and near syncopal event EXAM: CHEST - 2 VIEW COMPARISON:  09/07/2021 FINDINGS: Cardiac shadow is within normal limits. Right shoulder replacement is seen. Lungs are clear. No acute bony abnormality is noted. IMPRESSION: No active cardiopulmonary disease. Electronically Signed   By: Oneil Devonshire M.D.   On: 09/23/2024 03:40   CT Head Wo Contrast Result Date:  09/23/2024 EXAM: CT HEAD WITHOUT CONTRAST 09/23/2024 03:10:29 AM TECHNIQUE: CT of the head was performed without the administration of intravenous contrast. Automated exposure control, iterative reconstruction, and/or weight based adjustment of the mA/kV was utilized to reduce the radiation dose to as low as reasonably achievable. COMPARISON: None available. CLINICAL HISTORY: Delirium; near syncope at home, altered now. Table formatting from the original note was not included.; Delirium; near syncope at home, altered now. Table formatting from the original note was not included.; Delirium; near syncope at home, altered now. FINDINGS: BRAIN AND VENTRICLES: Parenchymal volume loss is commensurate with the patient's age. Periventricular white matter changes are present likely reflecting the sequela of small vessel ischemia. No acute hemorrhage. No evidence of acute infarct. No hydrocephalus. No extra-axial collection. No mass effect or midline shift. ORBITS: No acute abnormality. SINUSES: No acute abnormality. SOFT TISSUES AND SKULL: No acute soft tissue abnormality. No skull fracture. IMPRESSION: 1. No acute intracranial abnormality. Electronically signed by: Dorethia Molt MD 09/23/2024 03:24 AM EDT RP  Workstation: [id number]     Procedures   Medications Ordered in the ED - No data to display                                  Medical Decision Making Amount and/or Complexity of Data Reviewed Labs: ordered. Radiology: ordered and independent interpretation performed. ECG/medicine tests: ordered and independent interpretation performed.   73 year old female presenting to the ED via EMS after near syncopal event.  Woke up from a nap on the couch and had near syncopal event at home.  There was no fall or direct head trauma.  Husband reports checked BP at home and was in the 70s systolic but normal with EMS arrival.  Initially evaluated patient in triage and she was getting very confused, found pushing her wheelchair up the hallway stating she was leaving.  Initial work up is reassuring with stable labs.  Trop WNL.  UA without signs of infection.  CT head and CXR are clear.    4:32 AM Patient reassessed.  Seems much more oriented at this time.  Husband is at bedside which I think is helping.  Patient states currently she is feeling fine.  I have gotten her up to ambulate her, now reports she is feeling off balance when walking.  Denies feelings of syncope.  Does not appear she was having this previously.  When talking with her, she does not remember incident during triage where she was confused and found to be wandering with her wheelchair, did not recall event coming to the ER when husband arrived so was confused how she got here.  Husband reports she did have an MRI a few months ago due to some memory issues but is never been like this before and has never had any balance/coordination issues.  Will obtain MRI today to r/o occult stroke.  6:27 AM MRI negative for acute findings.  BP's have been stable here.  She has been up and ambulatory again to restroom without issue, denies feeling off balance. Suspect she may have had some orthostatic hypotension after getting up earlier today.   Recommended good oral hydration.  In regards to memory issues-- they have been referred to neurology and seen them already, nothing really came of this per husband.  Seems to be at her baseline now.  Can follow-up with neurology if this recurs/worsens.  Recommended to see PCP in the interim.  Can return here for new concerns.  Final diagnoses:  Near syncope    ED Discharge Orders     None          Jarold Olam HERO, PA-C 09/23/24 0630    Bari Charmaine FALCON, MD 09/24/24 [phone]

## 2024-09-23 NOTE — Discharge Instructions (Signed)
 Work-up today was reassuring, no acute findings and blood pressure has been stable here. Keep an eye on things at home, make sure to eat/drink regularly. Follow-up with your doctor. Return here for new concerns.

## 2024-09-23 NOTE — ED Provider Triage Note (Signed)
 Emergency Medicine Provider Triage Evaluation Note  Kim Brown , a 73 y.o. female  was evaluated in triage.  Pt complains of near syncopal event.  Apparently had been feeling weak earlier today, took nap and had syncopal event when she got up from cough this evening.  BP readings low at home per husband.  As patient has been here in the ED, starting to appear confused and asking repetitive questions.  No reported head trauma.  Review of Systems  Positive: Near syncope Negative: fever  Physical Exam  BP 128/72 (BP Location: Left Arm)   Pulse (!) 57   Temp 98.3 F (36.8 C) (Oral)   Resp 16   SpO2 100%  Gen:   Awake, no distress  Resp:  Normal effort  MSK:   Moves extremities without difficulty  Other:  No visible head trauma on exam, found to be pushing wheelchair up and down hall, able to state name and location but thinks it is 2022  Medical Decision Making  Medically screening exam initiated at 2:58 AM.  Appropriate orders placed.  Kim Brown was informed that the remainder of the evaluation will be completed by another provider, this initial triage assessment does not replace that evaluation, and the importance of remaining in the ED until their evaluation is complete.  Near syncope.  Starting to seem a bit altered here since time of arrival.  Labs and EKG obtained, will add on CT head and CXR.   Kim Olam HERO, PA-C 09/23/24 0300

## 2024-10-22 DIAGNOSIS — H5203 Hypermetropia, bilateral: Secondary | ICD-10-CM | POA: Diagnosis not present

## 2024-10-22 DIAGNOSIS — H35033 Hypertensive retinopathy, bilateral: Secondary | ICD-10-CM | POA: Diagnosis not present

## 2024-10-22 DIAGNOSIS — H52223 Regular astigmatism, bilateral: Secondary | ICD-10-CM | POA: Diagnosis not present

## 2024-10-22 DIAGNOSIS — H04123 Dry eye syndrome of bilateral lacrimal glands: Secondary | ICD-10-CM | POA: Diagnosis not present

## 2024-10-22 DIAGNOSIS — H524 Presbyopia: Secondary | ICD-10-CM | POA: Diagnosis not present

## 2024-10-22 DIAGNOSIS — H2513 Age-related nuclear cataract, bilateral: Secondary | ICD-10-CM | POA: Diagnosis not present

## 2024-11-17 ENCOUNTER — Encounter: Attending: Psychology | Admitting: Psychology

## 2024-11-19 ENCOUNTER — Encounter: Payer: Self-pay | Admitting: Pharmacist

## 2024-11-19 ENCOUNTER — Telehealth: Payer: Self-pay | Admitting: Pharmacist

## 2024-11-19 ENCOUNTER — Telehealth: Payer: Self-pay | Admitting: Pharmacy Technician

## 2024-11-19 ENCOUNTER — Ambulatory Visit: Attending: Cardiology | Admitting: Pharmacist

## 2024-11-19 ENCOUNTER — Other Ambulatory Visit (HOSPITAL_COMMUNITY): Payer: Self-pay

## 2024-11-19 DIAGNOSIS — E7849 Other hyperlipidemia: Secondary | ICD-10-CM

## 2024-11-19 NOTE — Progress Notes (Signed)
 Patient ID: Imagine Nest                 DOB: 03/12/1951                    MRN: [id number]      HPI: Kim Brown is a 73 y.o. female patient referred to lipid clinic by Kim Fabry, PA-C. PMH is significant for CAD, hx of STEMI ICM.   Last lipid lab LDLc 190, TG 219 and TC 274 while on Zetia  5 mg daily. Patient has multiple statin intolerance- due to myalgia. Presented today for lipid clinic to learn about non statin options. She eats mainly home cooked meals and exercise regularly. She takes only half tablet of Zetia  does not recall why was her dose reduced doesn't recall any intolerance from 10 mg daily dose. In agreement to up the dose to full tab  Reviewed options for lowering LDL cholesterol, including PCSK-9 inhibitors, bempedoic acid and inclisiran.  Discussed mechanisms of action, dosing, side effects and potential decreases in LDL cholesterol.  Also reviewed cost information and potential options for patient assistance.   Patient has a memory issue so husband will help her to do Repatha injections at home  Current Medications: Zetia  5 mg daily  Intolerances: lovastatin , pravastatin , rosuvastatin  and simvastatin - myalgia  Risk Factors:  CAD, hx of STEMI, family hx of CAD LDL goal: <70 mg/dl   Diet: healthy, lot of grilled chicken fruits and vegetables  Eats out - 2 times per month  Beverage: water  and tea 2 gallon with 1/2 cup sugar and the batch lasts for 2 weeks, coffee with milk  Snacks: apple or other fruits  Exercise: walks - 25 min daily, resistance band 15 min 3 times per week   Family History:  Relation Problem Comments  Mother (Deceased) Arthritis   Colon polyps   Hypertension     Father (Deceased) Arthritis   Heart disease   Hypertension   Stroke     Sister Metallurgist)   Brother (Deceased) Diabetes   Heart failure   Pancreatic cancer       Social History:  Alcohol: none  Smoking: never  Labs:  Lipid Panel     Component Value Date/Time   CHOL 274  (H) 08/20/2024 1046   TRIG 219 (H) 08/20/2024 1046   HDL 42 08/20/2024 1046   CHOLHDL 6.5 (H) 08/20/2024 1046   CHOLHDL 5 01/28/2024 1114   VLDL 50.6 (H) 01/28/2024 1114   LDLCALC 190 (H) 08/20/2024 1046   LDLDIRECT 184.0 09/05/2019 1120   LABVLDL 42 (H) 08/20/2024 1046    Past Medical History:  Diagnosis Date   Allergy    Anemia    Arthritis    feet and hands , neck, shoulder   Coronary artery disease    Dysrhythmia 2006   atrial flutter   Exogenous obesity    Heel spur    Hyperlipidemia    Hypertension    Hypothyroid    Irregular heart beats    Ischemic cardiomyopathy    Palpitations    Prolapsed, anus    uses mag oxide powder nightly    RSD (reflex sympathetic dystrophy)    after surgery on R foot   STEMI (ST elevation myocardial infarction) (HCC)    Stress incontinence, female     Current Outpatient Medications on File Prior to Visit  Medication Sig Dispense Refill   Ascorbic Acid (VITAMIN C) 1000 MG tablet Take 1,000 mg by mouth daily.  aspirin  EC 81 MG tablet Take 81 mg by mouth daily. Swallow whole.     b complex vitamins tablet Take 1 tablet by mouth daily.     clopidogrel  (PLAVIX ) 75 MG tablet Take 1 tablet (75 mg total) by mouth daily.     Cod Liver Oil CAPS Take 1 capsule by mouth daily at 6 (six) AM.     estradiol (CLIMARA - DOSED IN MG/24 HR) 0.05 mg/24hr patch 0.05 mg once a week.     ezetimibe  (ZETIA ) 10 MG tablet Take 5 mg by mouth daily.     Magnesium 250 MG TABS Take 250 mg by mouth daily.     metoprolol  tartrate (LOPRESSOR ) 25 MG tablet Take 1 tablet (25 mg total) by mouth 2 (two) times daily. 180 tablet 3   nitroGLYCERIN  (NITROSTAT ) 0.4 MG SL tablet Place 1 tablet (0.4 mg total) under the tongue every 5 (five) minutes as needed for chest pain. 25 tablet 2   OVER THE COUNTER MEDICATION Take 600 mg by mouth daily at 6 (six) AM. RED YEAST RICE W/ CO Q 10.     OVER THE COUNTER MEDICATION Take 2,000 Units by mouth daily at 6 (six) AM. NATTOKINASE  bloodthinner supplement.     Polyethyl Glycol-Propyl Glycol (SYSTANE) 0.4-0.3 % SOLN Place 1 drop into both eyes 2 (two) times daily.     progesterone  (PROMETRIUM ) 100 MG capsule Take 300 mg by mouth at bedtime.      thyroid  (ARMOUR) 60 MG tablet 60mg  in the AM and 60mg  in the PM.     zinc gluconate 50 MG tablet Take 50 mg by mouth daily.     No current facility-administered medications on file prior to visit.    Allergies  Allergen Reactions   Albuterol      Jittery sensation   Lovastatin      Myalgias   Pravastatin      Myalgias on 10mg     Rosuvastatin      Lethargy and brain fog, nervousness on 5mg  and 40mg  daily   Vytorin [Ezetimibe -Simvastatin]     Muscle aches    Assessment/Plan:  1. Hyperlipidemia -  Problem  Hyperlipidemia   Current Medications: Zetia  10 mg daily  Intolerances: lovastatin , pravastatin , rosuvastatin  and simvastatin - myalgia  Risk Factors:  CAD, hx of STEMI, family hx of CAD LDL goal: <70 mg/dl     Hyperlipidemia Assessment:  LDL goal: <70  mg/dl TG <849 mg/dl last LDLc 809  mg/dl TG 780 mg/dl  (90/96/7974) Tolerates Zetia   well without any side effects  Intolerance to multiple statins due to myalgia - had tried lovastatin , pravastatin , rosuvastatin  and simvastatin  Discussed next potential options (PCSK-9 inhibitors, bempedoic acid and inclisiran); cost, dosing efficacy, side effects  Exercises regularly - given elevated TG level advised to cut back on carb and fat dense food   Plan: Increase ezetimibe  dose to 10 mg daily  Will apply for PA for PCSK9i; will inform patient upon approval (prefers MyChart message) Lipid lab due in 2-3 months after starting PCSK9i    Thank you,  Kim Brown, Pharm.D [name]. [facility] [address], [place], [place] [address] Phone: [phone]; Fax: [phone]

## 2024-11-19 NOTE — Telephone Encounter (Signed)
   Pharmacy Patient Advocate Encounter   Received notification from Pt Calls Messages that prior authorization for REPATHA is required/requested.   Insurance verification completed.   The patient is insured through [facility] ADVANTAGE/RX ADVANCE.   Per test claim: PA required; PA submitted to above mentioned insurance via Latent Key/confirmation #/EOC [id number] Status is pending    Pharmacy Patient Advocate Encounter  Received notification from [facility] ADVANTAGE/RX ADVANCE that Prior Authorization for repatha  has been APPROVED from 11/19/24 to 05/18/25. Ran test claim, Copay is $47.00. This test claim was processed through [facility]- copay amounts may vary at other pharmacies due to pharmacy/plan contracts, or as the patient moves through the different stages of their insurance plan.   PA #/Case ID/Reference #: [id number]

## 2024-11-19 NOTE — Assessment & Plan Note (Addendum)
 Assessment:  LDL goal: <70  mg/dl TG <849 mg/dl last LDLc 809  mg/dl TG 780 mg/dl  (90/96/7974) Tolerates Zetia   well without any side effects  Intolerance to multiple statins due to myalgia - had tried lovastatin , pravastatin , rosuvastatin  and simvastatin  Discussed next potential options (PCSK-9 inhibitors, bempedoic acid and inclisiran); cost, dosing efficacy, side effects  Exercises regularly - given elevated TG level advised to cut back on carb and fat dense food   Plan: Increase ezetimibe  dose to 10 mg daily  Will apply for PA for PCSK9i; will inform patient upon approval (prefers MyChart message) Lipid lab due in 2-3 months after starting PCSK9i

## 2024-11-19 NOTE — Patient Instructions (Signed)
 Your Results:             Your most recent labs Goal  Total Cholesterol 274 < 200  Triglycerides 219  < 150  HDL (happy/good cholesterol) 42 > 40  LDL (lousy/bad cholesterol 190 < 70   Medication changes: start taking ezetimibe  10 mg daily  We will start the process to get PCSK9i ( Repatha or Praluent)  covered by your insurance.  Once the prior authorization is complete, we will call you to let you know and confirm pharmacy information.      Praluent is a cholesterol medication that improved your body's ability to get rid of bad cholesterol known as LDL. It can lower your LDL up to 60%. It is an injection that is given under the skin every 2 weeks. The most common side effects of Praluent include runny nose, symptoms of the common cold, rarely flu or flu-like symptoms, back/muscle pain in about 3-4% of the patients, and redness, pain, or bruising at the injection site.    Repatha is a cholesterol medication that improved your body's ability to get rid of bad cholesterol known as LDL. It can lower your LDL up to 60%! It is an injection that is given under the skin every 2 weeks. The most common side effects of Repatha include runny nose, symptoms of the common cold, rarely flu or flu-like symptoms, back/muscle pain in about 3-4% of the patients, and redness, pain, or bruising at the injection site.   Lab orders: We want to repeat labs after 2-3 months.  We will send you a lab order to remind you once we get closer to that time.

## 2024-11-20 MED ORDER — REPATHA SURECLICK 140 MG/ML ~~LOC~~ SOAJ: 140.0000 mg | SUBCUTANEOUS | 3 refills | Status: AC

## 2024-11-20 NOTE — Telephone Encounter (Signed)
 PA for Repatha approved - pt made aware.

## 2024-12-29 ENCOUNTER — Ambulatory Visit: Payer: PPO

## 2024-12-29 VITALS — Ht 63.0 in | Wt 162.0 lb

## 2024-12-29 DIAGNOSIS — Z0000 Encounter for general adult medical examination without abnormal findings: Secondary | ICD-10-CM

## 2024-12-29 DIAGNOSIS — Z1231 Encounter for screening mammogram for malignant neoplasm of breast: Secondary | ICD-10-CM

## 2024-12-29 NOTE — Patient Instructions (Addendum)
 Kim Brown,  Thank you for taking the time for your Medicare Wellness Visit. I appreciate your continued commitment to your health goals. Please review the care plan we discussed, and feel free to reach out if I can assist you further.  Please note that Annual Wellness Visits do not include a physical exam. Some assessments may be limited, especially if the visit was conducted virtually. If needed, we may recommend an in-person follow-up with your provider.  Ongoing Care Seeing your primary care provider every 3 to 6 months helps us  monitor your health and provide consistent, personalized care.   Referrals If a referral was made during today's visit and you haven't received any updates within two weeks, please contact the referred provider directly to check on the status.  Recommended Screenings:  Health Maintenance  Topic Date Due   Zoster (Shingles) Vaccine (1 of 2) Never done   DTaP/Tdap/Td vaccine (2 - Td or Tdap) 12/19/2015   Flu Shot  07/18/2024   Breast Cancer Screening  08/15/2024   Medicare Annual Wellness Visit  12/29/2025   Colon Cancer Screening  06/01/2027   Pneumococcal Vaccine for age over 69  Completed   Osteoporosis screening with Bone Density Scan  Completed   Hepatitis C Screening  Completed   Meningitis B Vaccine  Aged Out   COVID-19 Vaccine  Discontinued       12/29/2024   10:31 AM  Advanced Directives  Does Patient Have a Medical Advance Directive? No  Would patient like information on creating a medical advance directive? Yes (MAU/Ambulatory/Procedural Areas - Information given)    Vision: Annual vision screenings are recommended for early detection of glaucoma, cataracts, and diabetic retinopathy. These exams can also reveal signs of chronic conditions such as diabetes and high blood pressure.  Dental: Annual dental screenings help detect early signs of oral cancer, gum disease, and other conditions linked to overall health, including heart disease and  diabetes.

## 2024-12-29 NOTE — Progress Notes (Signed)
 "  Chief Complaint  Patient presents with   Medicare Wellness     Subjective:   Kim Brown is a 74 y.o. female who presents for a Medicare Annual Wellness Visit.  Visit info / Clinical Intake: Medicare Wellness Visit Type:: Subsequent Annual Wellness Visit Persons participating in visit and providing information:: patient Medicare Wellness Visit Mode:: Video Since this visit was completed virtually, some vitals may be partially provided or unavailable. Missing vitals are due to the limitations of the virtual format.: Documented vitals are patient reported If Telephone or Video please confirm:: I connected with patient using audio/video enable telemedicine. I verified patient identity with two identifiers, discussed telehealth limitations, and patient agreed to proceed. Patient Location:: Home Provider Location:: Office Interpreter Needed?: No Pre-visit prep was completed: yes AWV questionnaire completed by patient prior to visit?: no Living arrangements:: lives with spouse/significant other Patient's Overall Health Status Rating: good Typical amount of pain: none Does pain affect daily life?: no Are you currently prescribed opioids?: no  Dietary Habits and Nutritional Risks How many meals a day?: 2 (2 1/2) Eats fruit and vegetables daily?: yes Most meals are obtained by: preparing own meals; eating out In the last 2 weeks, have you had any of the following?: none Diabetic:: no  Functional Status Activities of Daily Living (to include ambulation/medication): Independent Ambulation: Independent with device- listed below Home Assistive Devices/Equipment: Eyeglasses Medication Administration: Independent Home Management (perform basic housework or laundry): Independent Manage your own finances?: yes Primary transportation is: driving; family / friends Concerns about vision?: no *vision screening is required for WTM* Concerns about hearing?: no  Fall Screening Falls in the  past year?: 0 Number of falls in past year: 0 Was there an injury with Fall?: 0 Fall Risk Category Calculator: 0 Patient Fall Risk Level: Low Fall Risk  Fall Risk Patient at Risk for Falls Due to: No Fall Risks Fall risk Follow up: Falls evaluation completed; Falls prevention discussed  Home and Transportation Safety: All rugs have non-skid backing?: N/A, no rugs All stairs or steps have railings?: yes Grab bars in the bathtub or shower?: yes Have non-skid surface in bathtub or shower?: yes Good home lighting?: yes Regular seat belt use?: yes Hospital stays in the last year:: no  Cognitive Assessment Difficulty concentrating, remembering, or making decisions? : no Will 6CIT or Mini Cog be Completed: yes What year is it?: 0 points What month is it?: 0 points Give patient an address phrase to remember (5 components): [address] [place], [place] About what time is it?: 0 points Count backwards from 20 to 1: 0 points Say the months of the year in reverse: 0 points Repeat the address phrase from earlier: 4 points ([address]) 6 CIT Score: 4 points  Advance Directives (For Healthcare) Does Patient Have a Medical Advance Directive?: No Would patient like information on creating a medical advance directive?: Yes (MAU/Ambulatory/Procedural Areas - Information given)  Reviewed/Updated  Reviewed/Updated: Reviewed All (Medical, Surgical, Family, Medications, Allergies, Care Teams, Patient Goals)    Allergies (verified) Albuterol , Lovastatin , Pravastatin , Rosuvastatin , and Vytorin [ezetimibe -simvastatin]   Current Medications (verified) Outpatient Encounter Medications as of 12/29/2024  Medication Sig   Ascorbic Acid (VITAMIN C) 1000 MG tablet Take 1,000 mg by mouth daily.   aspirin  EC 81 MG tablet Take 81 mg by mouth daily. Swallow whole.   b complex vitamins tablet Take 1 tablet by mouth daily.   clopidogrel  (PLAVIX ) 75 MG tablet Take 1 tablet (75 mg total) by mouth daily.  Cod  Liver Oil CAPS Take 1 capsule by mouth daily at 6 (six) AM.   estradiol (CLIMARA - DOSED IN MG/24 HR) 0.05 mg/24hr patch 0.05 mg once a week.   Evolocumab  (REPATHA  SURECLICK) 140 MG/ML SOAJ Inject 140 mg into the skin every 14 (fourteen) days.   ezetimibe  (ZETIA ) 10 MG tablet Take 5 mg by mouth daily.   Magnesium 250 MG TABS Take 250 mg by mouth daily.   metoprolol  tartrate (LOPRESSOR ) 25 MG tablet Take 1 tablet (25 mg total) by mouth 2 (two) times daily.   nitroGLYCERIN  (NITROSTAT ) 0.4 MG SL tablet Place 1 tablet (0.4 mg total) under the tongue every 5 (five) minutes as needed for chest pain.   OVER THE COUNTER MEDICATION Take 600 mg by mouth daily at 6 (six) AM. RED YEAST RICE W/ CO Q 10.   OVER THE COUNTER MEDICATION Take 2,000 Units by mouth daily at 6 (six) AM. NATTOKINASE bloodthinner supplement.   Polyethyl Glycol-Propyl Glycol (SYSTANE) 0.4-0.3 % SOLN Place 1 drop into both eyes 2 (two) times daily.   progesterone  (PROMETRIUM ) 100 MG capsule Take 300 mg by mouth at bedtime.    thyroid  (ARMOUR) 60 MG tablet 60mg  in the AM and 60mg  in the PM.   zinc gluconate 50 MG tablet Take 50 mg by mouth daily.   No facility-administered encounter medications on file as of 12/29/2024.    History: Past Medical History:  Diagnosis Date   Allergy    Anemia    Arthritis    feet and hands , neck, shoulder   Coronary artery disease    Dysrhythmia 2006   atrial flutter   Exogenous obesity    Heel spur    Hyperlipidemia    Hypertension    Hypothyroid    Irregular heart beats    Ischemic cardiomyopathy    Palpitations    Prolapsed, anus    uses mag oxide powder nightly    RSD (reflex sympathetic dystrophy)    after surgery on R foot   STEMI (ST elevation myocardial infarction) (HCC)    Stress incontinence, female    Past Surgical History:  Procedure Laterality Date   ABDOMINAL HYSTERECTOMY  2005   BUNIONECTOMY     COLONOSCOPY     CORONARY ANGIOPLASTY     CORONARY STENT INTERVENTION N/A  03/20/2021   Procedure: CORONARY STENT INTERVENTION;  Surgeon: Dann Candyce RAMAN, MD;  Location: [facility] INVASIVE CV LAB;  Service: Cardiovascular;  Laterality: N/A;   CORONARY/GRAFT ACUTE MI REVASCULARIZATION N/A 03/20/2021   Procedure: Coronary/Graft Acute MI Revascularization;  Surgeon: Dann Candyce RAMAN, MD;  Location: [facility] INVASIVE CV LAB;  Service: Cardiovascular;  Laterality: N/A;   DILATION AND CURETTAGE OF UTERUS     LEFT HEART CATH AND CORONARY ANGIOGRAPHY N/A 03/20/2021   Procedure: LEFT HEART CATH AND CORONARY ANGIOGRAPHY;  Surgeon: Dann Candyce RAMAN, MD;  Location: [facility] INVASIVE CV LAB;  Service: Cardiovascular;  Laterality: N/A;   REVERSE SHOULDER ARTHROPLASTY Right 07/15/2020   Procedure: REVERSE SHOULDER ARTHROPLASTY;  Surgeon: Dozier Soulier, MD;  Location: [facility];  Service: Orthopedics;  Laterality: Right;   Family History  Problem Relation Age of Onset   Arthritis Mother    Hypertension Mother    Colon polyps Mother    Heart disease Father    Hypertension Father    Arthritis Father    Stroke Father    Heart failure Brother    Diabetes Brother    Pancreatic cancer Brother    Colon cancer Neg Hx  Breast cancer Neg Hx    Esophageal cancer Neg Hx    Rectal cancer Neg Hx    Stomach cancer Neg Hx    Social History   Occupational History   Occupation: Retired  Tobacco Use   Smoking status: Never   Smokeless tobacco: Never  Vaping Use   Vaping status: Never Used  Substance and Sexual Activity   Alcohol use: No   Drug use: No   Sexual activity: Yes   Tobacco Counseling Counseling given: No  SDOH Screenings   Food Insecurity: No Food Insecurity (12/29/2024)  Housing: Unknown (12/29/2024)  Transportation Needs: No Transportation Needs (12/29/2024)  Utilities: Not At Risk (12/29/2024)  Alcohol Screen: Low Risk (12/27/2023)  Depression (PHQ2-9): Low Risk (12/29/2024)  Financial Resource Strain: Low Risk (12/27/2023)  Physical Activity: Insufficiently Active  (12/29/2024)  Social Connections: Socially Integrated (12/29/2024)  Stress: No Stress Concern Present (12/29/2024)  Tobacco Use: Low Risk (12/29/2024)  Health Literacy: Adequate Health Literacy (12/29/2024)   See flowsheets for full screening details  Depression Screen PHQ 2 & 9 Depression Scale- Over the past 2 weeks, how often have you been bothered by any of the following problems? Little interest or pleasure in doing things: 0 Feeling down, depressed, or hopeless (PHQ Adolescent also includes...irritable): 0 PHQ-2 Total Score: 0 Trouble falling or staying asleep, or sleeping too much: 0 Feeling tired or having little energy: 0 Poor appetite or overeating (PHQ Adolescent also includes...weight loss): 0 Feeling bad about yourself - or that you are a failure or have let yourself or your family down: 0 Trouble concentrating on things, such as reading the newspaper or watching television (PHQ Adolescent also includes...like school work): 0 Moving or speaking so slowly that other people could have noticed. Or the opposite - being so fidgety or restless that you have been moving around a lot more than usual: 0 Thoughts that you would be better off dead, or of hurting yourself in some way: 0 PHQ-9 Total Score: 0 If you checked off any problems, how difficult have these problems made it for you to do your work, take care of things at home, or get along with other people?: Not difficult at all  Depression Treatment Depression Interventions/Treatment : EYV7-0 Score <4 Follow-up Not Indicated     Goals Addressed               This Visit's Progress     Patient Stated (pt-stated)        Patient stated she plans to continue watching her diet and walking             Objective:    Today's Vitals   12/29/24 1029  Weight: 162 lb (73.5 kg)  Height: 5' 3 (1.6 m)   Body mass index is 28.7 kg/m.  Hearing/Vision screen Hearing Screening - Comments:: Denies hearing difficulties    Vision Screening - Comments:: Wears rx glasses - up to date with routine eye exams with [facility] Immunizations and Health Maintenance Health Maintenance  Topic Date Due   Zoster Vaccines- Shingrix (1 of 2) Never done   DTaP/Tdap/Td (2 - Td or Tdap) 12/19/2015   Influenza Vaccine  07/18/2024   Mammogram  08/15/2024   Medicare Annual Wellness (AWV)  12/29/2025   Colonoscopy  06/01/2027   Pneumococcal Vaccine: 50+ Years  Completed   Bone Density Scan  Completed   Hepatitis C Screening  Completed   Meningococcal B Vaccine  Aged Out   COVID-19 Vaccine  Discontinued  Assessment/Plan:  This is a routine wellness examination for Kim Brown.  Mammogram status: ordered today  I have recommended that this patient have a immunization for Influenza, Tdap, and Shingles but she declines at this time. I have discussed the risks and benefits of this procedure with her. The patient verbalizes understanding.   Patient Care Team: Cleatus Arlyss RAMAN, MD as PCP - General (Family Medicine) Dann Candyce RAMAN, MD as PCP - Cardiology (Cardiology)  I have personally reviewed and noted the following in the patients chart:   Medical and social history Use of alcohol, tobacco or illicit drugs  Current medications and supplements including opioid prescriptions. Functional ability and status Nutritional status Physical activity Advanced directives List of other physicians Hospitalizations, surgeries, and ER visits in previous 12 months Vitals Screenings to include cognitive, depression, and falls Referrals and appointments  Orders Placed This Encounter  Procedures   MM 3D SCREENING MAMMOGRAM BILATERAL BREAST    Standing Status:   Future    Expiration Date:   12/29/2025    Reason for Exam (SYMPTOM  OR DIAGNOSIS REQUIRED):   screening for breast cancer    Preferred imaging location?:   [facility]   In addition, I have reviewed and discussed with patient certain preventive protocols,  quality metrics, and best practice recommendations. A written personalized care plan for preventive services as well as general preventive health recommendations were provided to patient.   Verdie CHRISTELLA Saba, CMA   12/29/2024   Return in 1 year (on 12/29/2025).  After Visit Summary: (MyChart) Due to this being a telephonic visit, the after visit summary with patients personalized plan was offered to patient via MyChart   Nurse Notes: scheduled 2027 AWV appt "

## 2025-01-01 ENCOUNTER — Encounter: Payer: Self-pay | Admitting: Podiatry

## 2025-01-01 ENCOUNTER — Ambulatory Visit: Admitting: Podiatry

## 2025-01-01 DIAGNOSIS — M21611 Bunion of right foot: Secondary | ICD-10-CM | POA: Diagnosis not present

## 2025-01-01 DIAGNOSIS — Z7901 Long term (current) use of anticoagulants: Secondary | ICD-10-CM

## 2025-01-01 DIAGNOSIS — L84 Corns and callosities: Secondary | ICD-10-CM | POA: Diagnosis not present

## 2025-01-01 NOTE — Patient Instructions (Signed)
 You can use UREA CREAM on the thick callus

## 2025-01-04 NOTE — Progress Notes (Signed)
"  °  Subjective:  Patient ID: Kim Brown, female    DOB: 11-11-1951,  MRN: [id number]  Chief Complaint  Patient presents with   Foot Pain    Patient presents today c/o right foot bunion, reports bunion makes it difficult to fufill every day activities including walking Pain level is a 7      History of Present Illness Kim Brown is a 74 year old female with a history of right foot surgery and Reflex Sympathetic Dystrophy (RSD) who presents with right foot pain due to a bunion.  She presents today with her daughter as well as husband.  She has a reoccurrence of the callus which is causing quite a bit of pain on the area of the bunion.  No open lesions or any drainage or pus.  Previous history She experiences localized pain in the right foot, particularly when there is pressure from shoes. The pain is exacerbated by a protrusion in the bunion area, and her toe sits up, causing discomfort. A callus has developed in this area. She alleviates discomfort by placing a cotton ball between her toes and the shoe to reduce rubbing.  She has not received recent treatment for her foot and does not recall any recent diagnostic studies. She is not using any medication for this condition. There are no current issues related to past RSD.  She is on Plavix       Objective:    Physical Exam General: AAO x3, NAD  Dermatological: On the medial aspect the right foot on the first metatarsal head, bunion is a hyperkeratotic protuberance without any underlying ulceration although there was some dried blood present underneath the callus.  Area is preulcerative.  There is no surrounding erythema, drainage or pus or any signs of infection.  Vascular: Dorsalis Pedis artery and Posterior Tibial artery pedal pulses are 2/4 bilateral with immedate capillary fill time.  There is no pain with calf compression, swelling, warmth, erythema.   Neruologic: Grossly intact via light touch bilateral.   Musculoskeletal:  Bunions present on the right foot as well as digital contractures.  Tenderness directly along the hyperkeratotic lesion.  No other areas of discomfort.   Assessment:   1. Bunion, right foot   2. Skin lesion   3.      Pre-ulcerative callus 4.      Chronic anticoagulation    Plan:  Patient was evaluated and treated and all questions answered.  Assessment and Plan Assessment & Plan Bunion of right foot -I again reviewed the x-rays with the patient as well as her family members today.  Discussed with conservative as well as surgical options.  They all want to continue with conservative treatment.  Dispensed offloading pads.  Discussed she is to avoid excess pressure.  Hyperkeratotic lesion, on Plavix  -Sharply debrided any complications or bleeding.  Moisturizer, urea cream daily.  Offloading.  Return in about 3 months (around 04/01/2025) for pre-ulcerative callus.  Donnice JONELLE Fees DPM "

## 2025-01-22 ENCOUNTER — Other Ambulatory Visit: Payer: PPO

## 2025-01-29 ENCOUNTER — Encounter: Payer: PPO | Admitting: Family Medicine

## 2025-04-02 ENCOUNTER — Ambulatory Visit: Admitting: Podiatry

## 2025-12-30 ENCOUNTER — Ambulatory Visit
